# Patient Record
Sex: Female | Born: 1935 | Race: White | Hispanic: No | Smoking: Never smoker
Health system: Southern US, Community
[De-identification: ages and names within clinical notes are randomized; demographics above are authoritative.]

## PROBLEM LIST (undated history)

## (undated) DIAGNOSIS — R112 Nausea with vomiting, unspecified: Secondary | ICD-10-CM

## (undated) DIAGNOSIS — I10 Essential (primary) hypertension: Secondary | ICD-10-CM

## (undated) DIAGNOSIS — Z9889 Other specified postprocedural states: Secondary | ICD-10-CM

## (undated) DIAGNOSIS — K8050 Calculus of bile duct without cholangitis or cholecystitis without obstruction: Secondary | ICD-10-CM

## (undated) HISTORY — PX: BREAST BIOPSY: SHX20

## (undated) HISTORY — PX: CHOLECYSTECTOMY: SHX55

---

## 2005-05-16 ENCOUNTER — Ambulatory Visit: Payer: Self-pay | Admitting: Gastroenterology

## 2007-01-21 ENCOUNTER — Other Ambulatory Visit: Payer: Self-pay

## 2007-01-21 ENCOUNTER — Ambulatory Visit: Payer: Self-pay | Admitting: Ophthalmology

## 2007-01-31 ENCOUNTER — Other Ambulatory Visit: Payer: Self-pay

## 2007-01-31 ENCOUNTER — Inpatient Hospital Stay: Payer: Self-pay | Admitting: General Surgery

## 2007-11-05 ENCOUNTER — Ambulatory Visit: Payer: Self-pay | Admitting: Ophthalmology

## 2007-11-24 ENCOUNTER — Ambulatory Visit: Payer: Self-pay | Admitting: Ophthalmology

## 2008-01-07 ENCOUNTER — Ambulatory Visit: Payer: Self-pay | Admitting: Ophthalmology

## 2008-01-19 ENCOUNTER — Ambulatory Visit: Payer: Self-pay | Admitting: Ophthalmology

## 2008-09-24 ENCOUNTER — Emergency Department: Payer: Self-pay | Admitting: Emergency Medicine

## 2012-06-29 ENCOUNTER — Emergency Department: Payer: Self-pay | Admitting: Emergency Medicine

## 2013-07-13 ENCOUNTER — Emergency Department: Payer: Self-pay | Admitting: Emergency Medicine

## 2013-07-13 LAB — COMPREHENSIVE METABOLIC PANEL
ANION GAP: 4 — AB (ref 7–16)
Albumin: 3.9 g/dL (ref 3.4–5.0)
Alkaline Phosphatase: 91 U/L
BUN: 16 mg/dL (ref 7–18)
Bilirubin,Total: 0.6 mg/dL (ref 0.2–1.0)
CHLORIDE: 104 mmol/L (ref 98–107)
Calcium, Total: 8.2 mg/dL — ABNORMAL LOW (ref 8.5–10.1)
Co2: 26 mmol/L (ref 21–32)
Creatinine: 0.89 mg/dL (ref 0.60–1.30)
GFR CALC NON AF AMER: 57 — AB
Glucose: 115 mg/dL — ABNORMAL HIGH (ref 65–99)
Osmolality: 270 (ref 275–301)
Potassium: 3.7 mmol/L (ref 3.5–5.1)
SGOT(AST): 39 U/L — ABNORMAL HIGH (ref 15–37)
SGPT (ALT): 26 U/L (ref 12–78)
Sodium: 134 mmol/L — ABNORMAL LOW (ref 136–145)
Total Protein: 6.7 g/dL (ref 6.4–8.2)

## 2013-07-13 LAB — URINALYSIS, COMPLETE
BACTERIA: NONE SEEN
BILIRUBIN, UR: NEGATIVE
Blood: NEGATIVE
GLUCOSE, UR: NEGATIVE mg/dL (ref 0–75)
Ketone: NEGATIVE
LEUKOCYTE ESTERASE: NEGATIVE
Nitrite: NEGATIVE
PROTEIN: NEGATIVE
Ph: 7 (ref 4.5–8.0)
SPECIFIC GRAVITY: 1.011 (ref 1.003–1.030)
Squamous Epithelial: <1
WBC UR: 1 /HPF (ref 0–5)

## 2013-07-13 LAB — CBC
HCT: 35.5 % (ref 35.0–47.0)
HGB: 12.3 g/dL (ref 12.0–16.0)
MCH: 35.3 pg — ABNORMAL HIGH (ref 26.0–34.0)
MCHC: 34.5 g/dL (ref 32.0–36.0)
MCV: 102 fL — AB (ref 80–100)
Platelet: 154 10*3/uL (ref 150–440)
RBC: 3.48 10*6/uL — ABNORMAL LOW (ref 3.80–5.20)
RDW: 13.1 % (ref 11.5–14.5)
WBC: 9.7 10*3/uL (ref 3.6–11.0)

## 2013-07-13 LAB — LIPASE, BLOOD: Lipase: 158 U/L (ref 73–393)

## 2013-07-13 LAB — TROPONIN I

## 2016-01-18 ENCOUNTER — Other Ambulatory Visit: Payer: Self-pay | Admitting: Family Medicine

## 2016-01-18 DIAGNOSIS — R0989 Other specified symptoms and signs involving the circulatory and respiratory systems: Secondary | ICD-10-CM

## 2016-01-26 ENCOUNTER — Ambulatory Visit
Admission: RE | Admit: 2016-01-26 | Discharge: 2016-01-26 | Disposition: A | Discharge status: Home / Self Care | Payer: Medicare Other | Source: Ambulatory Visit | Attending: Family Medicine | Admitting: Family Medicine

## 2016-01-26 DIAGNOSIS — R9089 Other abnormal findings on diagnostic imaging of central nervous system: Secondary | ICD-10-CM | POA: Diagnosis present

## 2016-01-26 DIAGNOSIS — I6523 Occlusion and stenosis of bilateral carotid arteries: Secondary | ICD-10-CM | POA: Diagnosis not present

## 2016-01-26 DIAGNOSIS — R0989 Other specified symptoms and signs involving the circulatory and respiratory systems: Secondary | ICD-10-CM

## 2016-02-22 DIAGNOSIS — K8050 Calculus of bile duct without cholangitis or cholecystitis without obstruction: Secondary | ICD-10-CM

## 2016-02-22 HISTORY — DX: Calculus of bile duct without cholangitis or cholecystitis without obstruction: K80.50

## 2016-03-05 ENCOUNTER — Other Ambulatory Visit: Payer: Self-pay | Admitting: Student

## 2016-03-05 DIAGNOSIS — R748 Abnormal levels of other serum enzymes: Secondary | ICD-10-CM

## 2016-03-10 ENCOUNTER — Encounter: Payer: Self-pay | Admitting: Emergency Medicine

## 2016-03-10 ENCOUNTER — Inpatient Hospital Stay (HOSPITAL_COMMUNITY)
Admission: AD | Admit: 2016-03-10 | Discharge: 2016-03-16 | DRG: 444 | Disposition: A | Discharge status: Skilled Nursing Facility | Payer: Medicare Other | Source: Other Acute Inpatient Hospital | Attending: Internal Medicine | Admitting: Internal Medicine

## 2016-03-10 ENCOUNTER — Emergency Department: Payer: Medicare Other

## 2016-03-10 ENCOUNTER — Emergency Department
Admission: EM | Admit: 2016-03-10 | Discharge: 2016-03-10 | Payer: Medicare Other | Attending: Emergency Medicine | Admitting: Emergency Medicine

## 2016-03-10 DIAGNOSIS — Z79899 Other long term (current) drug therapy: Secondary | ICD-10-CM | POA: Insufficient documentation

## 2016-03-10 DIAGNOSIS — Y95 Nosocomial condition: Secondary | ICD-10-CM | POA: Diagnosis not present

## 2016-03-10 DIAGNOSIS — J189 Pneumonia, unspecified organism: Secondary | ICD-10-CM | POA: Diagnosis not present

## 2016-03-10 DIAGNOSIS — K8050 Calculus of bile duct without cholangitis or cholecystitis without obstruction: Secondary | ICD-10-CM

## 2016-03-10 DIAGNOSIS — I10 Essential (primary) hypertension: Secondary | ICD-10-CM | POA: Insufficient documentation

## 2016-03-10 DIAGNOSIS — M1990 Unspecified osteoarthritis, unspecified site: Secondary | ICD-10-CM | POA: Diagnosis present

## 2016-03-10 DIAGNOSIS — R1010 Upper abdominal pain, unspecified: Secondary | ICD-10-CM | POA: Diagnosis present

## 2016-03-10 DIAGNOSIS — Z7982 Long term (current) use of aspirin: Secondary | ICD-10-CM | POA: Insufficient documentation

## 2016-03-10 DIAGNOSIS — R112 Nausea with vomiting, unspecified: Secondary | ICD-10-CM

## 2016-03-10 DIAGNOSIS — R109 Unspecified abdominal pain: Secondary | ICD-10-CM | POA: Diagnosis present

## 2016-03-10 DIAGNOSIS — K8051 Calculus of bile duct without cholangitis or cholecystitis with obstruction: Secondary | ICD-10-CM | POA: Insufficient documentation

## 2016-03-10 DIAGNOSIS — E876 Hypokalemia: Secondary | ICD-10-CM | POA: Diagnosis present

## 2016-03-10 DIAGNOSIS — K838 Other specified diseases of biliary tract: Secondary | ICD-10-CM | POA: Diagnosis present

## 2016-03-10 DIAGNOSIS — R7401 Elevation of levels of liver transaminase levels: Secondary | ICD-10-CM

## 2016-03-10 DIAGNOSIS — R1084 Generalized abdominal pain: Secondary | ICD-10-CM

## 2016-03-10 DIAGNOSIS — R740 Nonspecific elevation of levels of transaminase and lactic acid dehydrogenase [LDH]: Secondary | ICD-10-CM | POA: Insufficient documentation

## 2016-03-10 DIAGNOSIS — R509 Fever, unspecified: Secondary | ICD-10-CM

## 2016-03-10 DIAGNOSIS — D72829 Elevated white blood cell count, unspecified: Secondary | ICD-10-CM | POA: Diagnosis present

## 2016-03-10 DIAGNOSIS — K8030 Calculus of bile duct with cholangitis, unspecified, without obstruction: Secondary | ICD-10-CM | POA: Diagnosis not present

## 2016-03-10 DIAGNOSIS — R5081 Fever presenting with conditions classified elsewhere: Secondary | ICD-10-CM | POA: Diagnosis not present

## 2016-03-10 HISTORY — DX: Other specified postprocedural states: R11.2

## 2016-03-10 HISTORY — DX: Calculus of bile duct without cholangitis or cholecystitis without obstruction: K80.50

## 2016-03-10 HISTORY — DX: Other specified postprocedural states: Z98.890

## 2016-03-10 HISTORY — DX: Essential (primary) hypertension: I10

## 2016-03-10 LAB — URINALYSIS COMPLETE WITH MICROSCOPIC (ARMC ONLY)
BILIRUBIN URINE: NEGATIVE
Bacteria, UA: NONE SEEN
Glucose, UA: NEGATIVE mg/dL
HGB URINE DIPSTICK: NEGATIVE
LEUKOCYTES UA: NEGATIVE
NITRITE: NEGATIVE
PH: 7 (ref 5.0–8.0)
Protein, ur: NEGATIVE mg/dL
SPECIFIC GRAVITY, URINE: 1.014 (ref 1.005–1.030)

## 2016-03-10 LAB — COMPREHENSIVE METABOLIC PANEL
ALT: 280 U/L — ABNORMAL HIGH (ref 14–54)
AST: 284 U/L — AB (ref 15–41)
Albumin: 4 g/dL (ref 3.5–5.0)
Alkaline Phosphatase: 451 U/L — ABNORMAL HIGH (ref 38–126)
Anion gap: 11 (ref 5–15)
BILIRUBIN TOTAL: 5 mg/dL — AB (ref 0.3–1.2)
BUN: 18 mg/dL (ref 6–20)
CO2: 26 mmol/L (ref 22–32)
CREATININE: 0.96 mg/dL (ref 0.44–1.00)
Calcium: 9.9 mg/dL (ref 8.9–10.3)
Chloride: 97 mmol/L — ABNORMAL LOW (ref 101–111)
GFR, EST AFRICAN AMERICAN: 57 mL/min — AB (ref >60)
GFR, EST NON AFRICAN AMERICAN: 49 mL/min — AB (ref >60)
Glucose, Bld: 104 mg/dL — ABNORMAL HIGH (ref 65–99)
POTASSIUM: 3.8 mmol/L (ref 3.5–5.1)
Sodium: 134 mmol/L — ABNORMAL LOW (ref 135–145)
TOTAL PROTEIN: 7.1 g/dL (ref 6.5–8.1)

## 2016-03-10 LAB — CBC
HCT: 31.3 % — ABNORMAL LOW (ref 35.0–47.0)
Hemoglobin: 11 g/dL — ABNORMAL LOW (ref 12.0–16.0)
MCH: 36.3 pg — ABNORMAL HIGH (ref 26.0–34.0)
MCHC: 35.1 g/dL (ref 32.0–36.0)
MCV: 103.6 fL — ABNORMAL HIGH (ref 80.0–100.0)
PLATELETS: 212 10*3/uL (ref 150–440)
RBC: 3.03 MIL/uL — AB (ref 3.80–5.20)
RDW: 14 % (ref 11.5–14.5)
WBC: 12.2 10*3/uL — AB (ref 3.6–11.0)

## 2016-03-10 LAB — TROPONIN I: Troponin I: <0.03 ng/mL (ref <0.03)

## 2016-03-10 LAB — LIPASE, BLOOD: Lipase: 25 U/L (ref 11–51)

## 2016-03-10 MED ORDER — KETOROLAC TROMETHAMINE 15 MG/ML IJ SOLN
15.0000 mg | Freq: Four times a day (QID) | INTRAMUSCULAR | Status: AC | PRN
  Administered 2016-03-11 – 2016-03-15 (×4): 15 mg via INTRAVENOUS
  Filled 2016-03-10 (×3): qty 1

## 2016-03-10 MED ORDER — SODIUM CHLORIDE 0.9 % IV SOLN
INTRAVENOUS | Status: DC
  Administered 2016-03-11 – 2016-03-12 (×2): via INTRAVENOUS

## 2016-03-10 MED ORDER — ONDANSETRON HCL 4 MG PO TABS
4.0000 mg | ORAL_TABLET | Freq: Four times a day (QID) | ORAL | Status: DC | PRN
  Administered 2016-03-14: 4 mg via ORAL
  Filled 2016-03-10: qty 1

## 2016-03-10 MED ORDER — ONDANSETRON HCL 4 MG/2ML IJ SOLN
4.0000 mg | Freq: Four times a day (QID) | INTRAMUSCULAR | Status: DC | PRN
  Administered 2016-03-13 – 2016-03-15 (×2): 4 mg via INTRAVENOUS
  Filled 2016-03-10 (×2): qty 2

## 2016-03-10 MED ORDER — IOPAMIDOL (ISOVUE-300) INJECTION 61%
60.0000 mL | Freq: Once | INTRAVENOUS | Status: AC | PRN
Start: 2016-03-10 — End: 2016-03-10
  Administered 2016-03-10: 60 mL via INTRAVENOUS

## 2016-03-10 MED ORDER — SODIUM CHLORIDE 0.9 % IV BOLUS (SEPSIS)
1000.0000 mL | Freq: Once | INTRAVENOUS | Status: AC
Start: 2016-03-10 — End: 2016-03-10
  Administered 2016-03-10: 1000 mL via INTRAVENOUS

## 2016-03-10 MED ORDER — MORPHINE SULFATE (PF) 4 MG/ML IV SOLN
4.0000 mg | Freq: Once | INTRAVENOUS | Status: AC
  Administered 2016-03-10: 4 mg via INTRAVENOUS
  Filled 2016-03-10: qty 1

## 2016-03-10 MED ORDER — MORPHINE SULFATE (PF) 4 MG/ML IV SOLN: 2.0000 mg | Freq: Once | INTRAVENOUS | Status: DC

## 2016-03-10 MED ORDER — ONDANSETRON HCL 4 MG/2ML IJ SOLN: 4.0000 mg | Freq: Once | INTRAMUSCULAR | Status: DC

## 2016-03-10 MED ORDER — ENOXAPARIN SODIUM 30 MG/0.3ML ~~LOC~~ SOLN
30.0000 mg | SUBCUTANEOUS | Status: DC
  Administered 2016-03-11 – 2016-03-15 (×5): 30 mg via SUBCUTANEOUS
  Filled 2016-03-10 (×5): qty 0.3

## 2016-03-10 MED ORDER — IOPAMIDOL (ISOVUE-300) INJECTION 61%
15.0000 mL | INTRAVENOUS | Status: AC
  Administered 2016-03-10: 15 mL via ORAL

## 2016-03-10 MED ORDER — HYDRALAZINE HCL 20 MG/ML IJ SOLN: 10.0000 mg | Freq: Four times a day (QID) | INTRAMUSCULAR | Status: DC | PRN

## 2016-03-10 NOTE — Progress Notes (Signed)
Patient arrived to [facility] via carelink. Report received from [place], [place]. No new updates from carelink. Called patient placement to receive orders for patient. Was told that Dr. Benjamine Mola would be paged. Patient stable, alert and oriented x4. Denies pain. Will continue to monitor patient.

## 2016-03-10 NOTE — H&P (Signed)
History and Physical    Susan Blackwell UXL:[id number] DOB: 1920/04/16 DOA: 03/10/2016  Referring MD/NP/PA: Randell Loop PCP: [name], MD Outpatient Specialists:  Patient coming from: home via [facility]  Chief Complaint: white stools and abdominal pain  HPI: Susan Blackwell is a 82 y.o. female with medical history significant of HTN and osteoarthritis.  Patient lives in a single family home on her own. She still drives to the grocery store and does all her ADLs independently. She's had a several week history of not feeling well. over the last few days she's had light almost white stools. Yesterday having 3 episodes.  Patient has also had nausea and vomiting as well as abdominal pain. She was recently seen by her PCP to check lab work and found that her liver enzymes were elevated. She was to have a liver ultrasound on Monday. She will unfortunately was unable to do this as she had worsening pain and vomiting. In the ER [place] she had a CT scan of her abdomen.  This showed choledocholithiasis with increasing common bile duct and intrahepatic biliary dilatation. Common bile duct was previously 8 mm and is now 17 mm. GI coverage at [facility] was contacted but they unfortunately did not do ERCPs. Patient was referred to [facility] for GI evaluation and possible ERCP.  Per note by accepting physician Dr. Clyde Lundborg, ER doctor [place] contacted GI doctor here at [facility]-- could not find documentation of this.    Review of Systems: all systems reviewed, negative unless stated above in HPI   Past Medical History:  Diagnosis Date  . Hypertension     Past Surgical History:  Procedure Laterality Date  . CHOLECYSTECTOMY       reports that she has never smoked. She does not have any smokeless tobacco history on file. She reports that she does not drink alcohol. Her drug history is not on file.--live home alone  Allergies  Allergen Reactions  . Tramadol Nausea Only     Family hx reviewed  Prior to  Admission medications   Medication Sig Start Date End Date Taking? Authorizing Provider  aspirin EC 81 MG tablet Take 81 mg by mouth daily.    Historical Provider, MD  Calcium Carbonate-Vit D-Min (CALTRATE 600+D PLUS MINERALS) 600-800 MG-UNIT TABS Take 1 tablet by mouth daily.    Historical Provider, MD  Cholecalciferol 2000 units TABS Take 1 tablet by mouth daily.    Historical Provider, MD  dimenhyDRINATE (DRAMAMINE) 50 MG tablet Take 50 mg by mouth every 8 (eight) hours as needed.    Historical Provider, MD  Homeopathic Products ([facility]IC[facility]) GEL Apply 1 application topically.    Historical Provider, MD  hydrochlorothiazide (HYDRODIURIL) 25 MG tablet Take 25 mg by mouth daily.    Historical Provider, MD  HYDROcodone-acetaminophen (NORCO/VICODIN) 5-325 MG tablet Take 1 tablet by mouth every 4 (four) hours as needed. 1 tablet at 2100, 0.5 tablet at 0000 03/01/16   Historical Provider, MD  metoprolol (LOPRESSOR) 50 MG tablet Take 50 mg by mouth 2 (two) times daily.    Historical Provider, MD  potassium chloride (K-DUR) 10 MEQ tablet Take 1 tablet by mouth daily as needed. 01/25/16   Historical Provider, MD  valsartan (DIOVAN) 320 MG tablet Take 320 mg by mouth daily.    Historical Provider, MD  vitamin B-12 (CYANOCOBALAMIN) 1000 MCG tablet Take 1,000 mcg by mouth daily.    Historical Provider, MD    Physical Exam: Vitals:   03/10/16 2143  BP: (!) 139/54  Pulse: 79  Resp: 18  Temp: 98.6 F (37 C)  TempSrc: Oral  SpO2: 94%  Weight: 45.1 kg (99 lb 8 oz)  Height: 4\' 9"  (1.448 m)      Constitutional: NAD, calm, comfortable- no jaundice Vitals:   03/10/16 2143  BP: (!) 139/54  Pulse: 79  Resp: 18  Temp: 98.6 F (37 C)  TempSrc: Oral  SpO2: 94%  Weight: 45.1 kg (99 lb 8 oz)  Height: 4\' 9"  (1.448 m)   Eyes: PERRL, lids and conjunctivae normal ENMT: Mucous membranes are moist. Posterior pharynx clear of any exudate or lesions.Normal dentition.  Neck: normal, supple, no masses, no  thyromegaly Respiratory: clear to auscultation bilaterally, no wheezing, no crackles. Normal respiratory effort. No accessory muscle use.  Cardiovascular: Regular rate and rhythm, no murmurs / rubs / gallops. No extremity edema. 2+ pedal pulses. No carotid bruits.  Abdomen: minimal tenderness, no masses palpated. No hepatosplenomegaly. Bowel sounds positive.  Musculoskeletal: no clubbing / cyanosis. No joint deformity upper and lower extremities. Good ROM, no contractures. Normal muscle tone.  Skin: no rashes, lesions, ulcers. No induration Neurologic: CN 2-12 grossly intact. Sensation intact, DTR normal. Strength 5/5 in all 4.  Psychiatric: Normal judgment and insight. Alert and oriented x 3. Normal mood.    Labs on Admission: I have personally reviewed following labs and imaging studies  CBC:  Recent Labs Lab 03/10/16 1355  WBC 12.2*  HGB 11.0*  HCT 31.3*  MCV 103.6*  PLT 212   Basic Metabolic Panel:  Recent Labs Lab 03/10/16 1355  NA 134*  K 3.8  CL 97*  CO2 26  GLUCOSE 104*  BUN 18  CREATININE 0.96  CALCIUM 9.9   GFR: Estimated Creatinine Clearance: 21.8 mL/min (by C-G formula based on SCr of 0.96 mg/dL). Liver Function Tests:  Recent Labs Lab 03/10/16 1355  AST 284*  ALT 280*  ALKPHOS 451*  BILITOT 5.0*  PROT 7.1  ALBUMIN 4.0    Recent Labs Lab 03/10/16 1355  LIPASE 25   No results for input(s): AMMONIA in the last 168 hours. Coagulation Profile: No results for input(s): INR, PROTIME in the last 168 hours. Cardiac Enzymes:  Recent Labs Lab 03/10/16 1355  TROPONINI <0.03   BNP (last 3 results) No results for input(s): PROBNP in the last 8760 hours. HbA1C: No results for input(s): HGBA1C in the last 72 hours. CBG: No results for input(s): GLUCAP in the last 168 hours. Lipid Profile: No results for input(s): CHOL, HDL, LDLCALC, TRIG, CHOLHDL, LDLDIRECT in the last 72 hours. Thyroid Function Tests: No results for input(s): TSH, T4TOTAL,  FREET4, T3FREE, THYROIDAB in the last 72 hours. Anemia Panel: No results for input(s): VITAMINB12, FOLATE, FERRITIN, TIBC, IRON, RETICCTPCT in the last 72 hours. Urine analysis:    Component Value Date/Time   COLORURINE YELLOW (A) 03/10/2016 1743   APPEARANCEUR CLEAR (A) 03/10/2016 1743   APPEARANCEUR Clear 07/13/2013 0540   LABSPEC 1.014 03/10/2016 1743   LABSPEC 1.011 07/13/2013 0540   PHURINE 7.0 03/10/2016 1743   GLUCOSEU NEGATIVE 03/10/2016 1743   GLUCOSEU Negative 07/13/2013 0540   HGBUR NEGATIVE 03/10/2016 1743   BILIRUBINUR NEGATIVE 03/10/2016 1743   BILIRUBINUR Negative 07/13/2013 0540   KETONESUR TRACE (A) 03/10/2016 1743   PROTEINUR NEGATIVE 03/10/2016 1743   NITRITE NEGATIVE 03/10/2016 1743   LEUKOCYTESUR NEGATIVE 03/10/2016 1743   LEUKOCYTESUR Negative 07/13/2013 0540   Sepsis Labs: Invalid input(s): PROCALCITONIN, LACTICIDVEN No results found for this or any previous visit (from the past 240 hour(s)).  Radiological Exams on Admission: Ct Abdomen Pelvis W Contrast  Result Date: 03/10/2016 CLINICAL DATA:  82 year old female with abdominal pain and vomiting for 1 day. EXAM: CT ABDOMEN AND PELVIS WITH CONTRAST TECHNIQUE: Multidetector CT imaging of the abdomen and pelvis was performed using the standard protocol following bolus administration of intravenous contrast. CONTRAST:  51mL ISOVUE-300 IOPAMIDOL (ISOVUE-300) INJECTION 61% COMPARISON:  07/13/2013 FINDINGS: Lower chest: No acute abnormality. Hepatobiliary: Increasing CBD and intrahepatic biliary dilatation identified, with the CBD now measuring 17 mm, previously 8 mm. Multiple slightly higher density structures within the CBD for compatible with choledocholithiasis. No focal hepatic abnormalities are noted. The patient is status post cholecystectomy. Pancreas: No significant abnormality Spleen: No significant abnormality Adrenals/Urinary Tract: Mild bilateral renal atrophy noted. The adrenal glands and bladder are  unremarkable. Stomach/Bowel: There is no evidence of bowel obstruction or definite bowel wall thickening. Sigmoid colonic diverticulosis noted without diverticulitis. Vascular/Lymphatic: Abdominal aortic atherosclerotic calcifications noted without aneurysm. No enlarged lymph nodes are noted. Reproductive: Uterus and bilateral adnexa are unremarkable. Other: No abdominal wall hernia or abnormality. No abdominopelvic ascites. Musculoskeletal: Grade 1 spondylolisthesis at L5-S1 again noted. Multilevel degenerative disc disease, facet arthropathy and spondylosis again noted. IMPRESSION: Choledocholithiasis with increasing CBD and intrahepatic biliary dilatation. CBD now measures 17 mm, previously 8 mm. No other acute abnormalities. Abdominal aortic atherosclerosis. Electronically Signed   By: Harmon Pier M.D.   On: 03/10/2016 17:49      Assessment/Plan Active Problems:   HTN (hypertension)   Common bile duct dilatation   Common bile duct dilatation -will need to ensure GI consulted by ER for possible ERCP -trend liver enzymes -NPO  HTN -PRN hydralazine    DVT prophylaxis: lovenox Code Status: full Family Communication: patient Disposition Plan: home once work up complete Consults called: GI called by [facility] ER-- did not see documentation of this Admission status: inpt   Pluma Diniz U Muzammil Bruins DO [facility] Pager [phone]  If 7PM-7AM, please contact night-coverage [email or web address] Password [email or web address]  03/10/2016, 10:40 PM

## 2016-03-10 NOTE — ED Provider Notes (Signed)
[facility] Emergency Department Provider Note  ____________________________________________   First MD Initiated Contact with Patient 03/10/16 1422     (approximate)  I have reviewed the triage vital signs and the nursing notes.   HISTORY  Chief Complaint Abdominal Pain   HPI Susan Blackwell is a 82 y.o. female with a history of hypertension as well as an appendectomy and cholecystectomy was presenting to the emergency department today with several weeks of worsening upper abdominal pain. She says that she also vomited last night and it was green to black. She says that she has nausea now and a 4-5 out of 10 pain in the upper abdomen. She is denying any chest pain. She was recently seen by her primary care doctor who checked lab work and found the patient have elevated liver enzymes. She was scheduled for a liver ultrasound this coming Monday due to the worsening pain and now vomiting she presented to the ER today.Patient says that she is also had loose stool yesterday that were almost white in color.   Past Medical History:  Diagnosis Date  . Hypertension     There are no active problems to display for this patient.   Past Surgical History:  Procedure Laterality Date  . CHOLECYSTECTOMY      Prior to Admission medications   Not on File    Allergies Patient has no allergy information on record.  No family history on file.  Social History Social History  Substance Use Topics  . Smoking status: Never Smoker  . Smokeless tobacco: Not on file  . Alcohol use No    Review of Systems Constitutional: No fever/chills Eyes: No visual changes. ENT: No sore throat. Cardiovascular: Denies chest pain. Respiratory: Denies shortness of breath. Gastrointestinal:  No constipation. Genitourinary: Negative for dysuria. Musculoskeletal: Negative for back pain. Skin: Negative for rash. Neurological: Negative for headaches, focal weakness or  numbness.  10-point ROS otherwise negative.  ____________________________________________   PHYSICAL EXAM:  VITAL SIGNS: ED Triage Vitals  Enc Vitals Group     BP 03/10/16 1344 (!) 158/65     Pulse Rate 03/10/16 1344 68     Resp 03/10/16 1344 18     Temp 03/10/16 1344 98.6 F (37 C)     Temp Source 03/10/16 1344 Oral     SpO2 03/10/16 1344 100 %     Weight 03/10/16 1345 96 lb (43.5 kg)     Height 03/10/16 1345 4\' 9"  (1.448 m)     Head Circumference --      Peak Flow --      Pain Score 03/10/16 1345 10     Pain Loc --      Pain Edu? --      Excl. in GC? --     Constitutional: Alert and oriented. Well appearing and in no acute distress. Eyes: Conjunctivae are normal. PERRL. EOMI. Head: Atraumatic. Nose: No congestion/rhinnorhea. Mouth/Throat: Mucous membranes are moist.   Neck: No stridor.   Cardiovascular: Normal rate, regular rhythm. Grossly normal heart sounds.   Respiratory: Normal respiratory effort.  No retractions. Lungs CTAB. Gastrointestinal: Soft Mild tenderness to palpation across lower abdomen with moderate tenderness to palpation in the upper abdomen with a negative Murphy sign. No distention.  Musculoskeletal: No lower extremity tenderness nor edema.  No joint effusions. Neurologic:  Normal speech and language. No gross focal neurologic deficits are appreciated.  Skin:  Skin is warm, dry and intact. No rash noted.  Mildly jaundiced Psychiatric: Mood  and affect are normal. Speech and behavior are normal.  ____________________________________________   LABS (all labs ordered are listed, but only abnormal results are displayed)  Labs Reviewed  COMPREHENSIVE METABOLIC PANEL - Abnormal; Notable for the following:       Result Value   Sodium 134 (*)    Chloride 97 (*)    Glucose, Bld 104 (*)    AST 284 (*)    ALT 280 (*)    Alkaline Phosphatase 451 (*)    Total Bilirubin 5.0 (*)    GFR calc non Af Amer 49 (*)    GFR calc Af Amer 57 (*)    All other  components within normal limits  CBC - Abnormal; Notable for the following:    WBC 12.2 (*)    RBC 3.03 (*)    Hemoglobin 11.0 (*)    HCT 31.3 (*)    MCV 103.6 (*)    MCH 36.3 (*)    All other components within normal limits  LIPASE, BLOOD  TROPONIN I  URINALYSIS COMPLETEWITH MICROSCOPIC ([facility] ONLY)   ____________________________________________  EKG  ED ECG REPORT I, Arelia Longest, the attending physician, personally viewed and interpreted this ECG.   Date: 03/10/2016  EKG Time: 1403  Rate: 69  Rhythm: normal sinus rhythm  Axis: Normal  Intervals:none  ST&T Change: No ST segment elevation or depression. T-wave inversions in aVL. QRS widened from previous EKG of 2008. ____________________________________________  RADIOLOGY  Pending CAT scan of the abdomen and pelvis ____________________________________________   PROCEDURES  Procedure(s) performed:   Procedures  Critical Care performed:   ____________________________________________   INITIAL IMPRESSION / ASSESSMENT AND PLAN / ED COURSE  Pertinent labs & imaging results that were available during my care of the patient were reviewed by me and considered in my medical decision making (see chart for details).    Clinical Course    Concern for biliary obstruction causing cholestasis. I question the mass due to the patient's age. Patient given antiemetics as well as pain control. Signed out to Dr. Scotty Court.  ____________________________________________   FINAL CLINICAL IMPRESSION(S) / ED DIAGNOSES  Upper abdominal pain. Transaminitis. Hyperbilirubinemia. Nausea and vomiting.    NEW MEDICATIONS STARTED DURING THIS VISIT:  New Prescriptions   No medications on file     Note:  This document was prepared using Dragon voice recognition software and may include unintentional dictation errors.    Myrna Blazer, MD 03/10/16 [phone]

## 2016-03-10 NOTE — ED Notes (Signed)
ED Provider at bedside. 

## 2016-03-10 NOTE — ED Provider Notes (Signed)
 -----------------------------------------   7:14 PM on 03/10/2016 -----------------------------------------  CT reveals choledocholithiasis, cbd 72mm. No pancreatic mass. D/w GI Dr. Earvin Hansen, doesn't perform ERCP. Will need to transfer pt to [facility] for GI eval. Pt stable for transfer. Carelink contact to page [facility] hospitalist.       Sharman Cheek, MD 03/12/16 [phone]

## 2016-03-10 NOTE — ED Notes (Signed)
Called [facility] to give report, talked to Delice Bison, Charity fundraiser, reported she would call when available to take report.

## 2016-03-10 NOTE — ED Notes (Signed)
Patient transported to CT 

## 2016-03-10 NOTE — Progress Notes (Signed)
This is a no charge note  Transfer from [facility] hospital per Dr. Thomos Lemons  82 year old man with past medical history of hypertension, s/p of cholecystectomy, who presents with nausea, vomiting and abdominal pain. Abnormal liver function with total bilirubin 5.0, ALP 451, AST 284 and ALT 280. CT-abdomen/pelvis that showed choledocholithiasis with dilated CBD to 17 mm. WBC 12.2, negative troponin, lipase 25, negative urinalysis, electrolytes renal function okay, temperature normal, no tachycardia, no tachypnea, O2 saturation 99% on room air, blood pressure 148/68. They have [facility] coverage, who does not ERCP, therefore pt needs to be transferred to [facility]. Pt is accepted to med-surg bed as inpt. EDP will consult to our GI.  Lorretta Harp, MD  [facility] Pager [phone]  If 7PM-7AM, please contact night-coverage [email or web address] Password [facility] 03/10/2016, 7:44 PM

## 2016-03-10 NOTE — ED Triage Notes (Signed)
Abdominal pain x several weeks per patient, nausea and vomiting during night.

## 2016-03-10 NOTE — ED Notes (Signed)
Pt. Did not think she could finish 2nd bottle, called CT and talked to Dr. Scotty Court to go ahead with CT.

## 2016-03-11 ENCOUNTER — Inpatient Hospital Stay (HOSPITAL_COMMUNITY): Payer: Medicare Other | Admitting: Anesthesiology

## 2016-03-11 ENCOUNTER — Inpatient Hospital Stay (HOSPITAL_COMMUNITY): Payer: Medicare Other

## 2016-03-11 ENCOUNTER — Encounter (HOSPITAL_COMMUNITY)
Admission: AD | Disposition: A | Discharge status: Skilled Nursing Facility | Payer: Self-pay | Source: Other Acute Inpatient Hospital | Attending: Internal Medicine

## 2016-03-11 ENCOUNTER — Encounter (HOSPITAL_COMMUNITY): Payer: Self-pay | Admitting: Gastroenterology

## 2016-03-11 DIAGNOSIS — K8050 Calculus of bile duct without cholangitis or cholecystitis without obstruction: Secondary | ICD-10-CM | POA: Diagnosis present

## 2016-03-11 HISTORY — PX: ERCP: SHX5425

## 2016-03-11 LAB — CBC
HCT: 27 % — ABNORMAL LOW (ref 36.0–46.0)
Hemoglobin: 9 g/dL — ABNORMAL LOW (ref 12.0–15.0)
MCH: 33.8 pg (ref 26.0–34.0)
MCHC: 33.3 g/dL (ref 30.0–36.0)
MCV: 101.5 fL — AB (ref 78.0–100.0)
PLATELETS: 171 10*3/uL (ref 150–400)
RBC: 2.66 MIL/uL — AB (ref 3.87–5.11)
RDW: 13.7 % (ref 11.5–15.5)
WBC: 5.3 10*3/uL (ref 4.0–10.5)

## 2016-03-11 LAB — COMPREHENSIVE METABOLIC PANEL
ALT: 179 U/L — AB (ref 14–54)
AST: 137 U/L — AB (ref 15–41)
Albumin: 2.9 g/dL — ABNORMAL LOW (ref 3.5–5.0)
Alkaline Phosphatase: 368 U/L — ABNORMAL HIGH (ref 38–126)
Anion gap: 7 (ref 5–15)
BUN: 11 mg/dL (ref 6–20)
CHLORIDE: 102 mmol/L (ref 101–111)
CO2: 27 mmol/L (ref 22–32)
CREATININE: 0.79 mg/dL (ref 0.44–1.00)
Calcium: 9.1 mg/dL (ref 8.9–10.3)
GFR calc non Af Amer: >60 mL/min (ref >60)
Glucose, Bld: 84 mg/dL (ref 65–99)
POTASSIUM: 3.2 mmol/L — AB (ref 3.5–5.1)
SODIUM: 136 mmol/L (ref 135–145)
Total Bilirubin: 3.6 mg/dL — ABNORMAL HIGH (ref 0.3–1.2)
Total Protein: 5.5 g/dL — ABNORMAL LOW (ref 6.5–8.1)

## 2016-03-11 SURGERY — ERCP, WITH INTERVENTION IF INDICATED
Anesthesia: General

## 2016-03-11 MED ORDER — ONDANSETRON HCL 4 MG/2ML IJ SOLN
INTRAMUSCULAR | Status: DC | PRN
  Administered 2016-03-11: 4 mg via INTRAVENOUS

## 2016-03-11 MED ORDER — FENTANYL CITRATE (PF) 100 MCG/2ML IJ SOLN
INTRAMUSCULAR | Status: DC | PRN
  Administered 2016-03-11 (×2): 25 ug via INTRAVENOUS

## 2016-03-11 MED ORDER — METOPROLOL TARTRATE 50 MG PO TABS
50.0000 mg | ORAL_TABLET | Freq: Two times a day (BID) | ORAL | Status: DC
  Administered 2016-03-11 – 2016-03-16 (×10): 50 mg via ORAL
  Filled 2016-03-11 (×10): qty 1

## 2016-03-11 MED ORDER — SODIUM CHLORIDE 0.9 % IV SOLN: INTRAVENOUS | Status: DC

## 2016-03-11 MED ORDER — PHENYLEPHRINE HCL 10 MG/ML IJ SOLN
INTRAVENOUS | Status: DC | PRN
  Administered 2016-03-11: 25 ug/min via INTRAVENOUS

## 2016-03-11 MED ORDER — CIPROFLOXACIN IN D5W 400 MG/200ML IV SOLN
INTRAVENOUS | Status: DC | PRN
  Administered 2016-03-11: 400 mg via INTRAVENOUS

## 2016-03-11 MED ORDER — NAPHAZOLINE-GLYCERIN 0.012-0.2 % OP SOLN
1.0000 [drp] | Freq: Four times a day (QID) | OPHTHALMIC | Status: DC | PRN
  Administered 2016-03-11: 2 [drp] via OPHTHALMIC
  Filled 2016-03-11: qty 15

## 2016-03-11 MED ORDER — DEXAMETHASONE SODIUM PHOSPHATE 10 MG/ML IJ SOLN
INTRAMUSCULAR | Status: DC | PRN
  Administered 2016-03-11: 10 mg via INTRAVENOUS

## 2016-03-11 MED ORDER — DIMENHYDRINATE 50 MG PO TABS
50.0000 mg | ORAL_TABLET | Freq: Three times a day (TID) | ORAL | Status: DC | PRN
  Filled 2016-03-11: qty 1

## 2016-03-11 MED ORDER — CIPROFLOXACIN IN D5W 400 MG/200ML IV SOLN
INTRAVENOUS | Status: AC
  Filled 2016-03-11: qty 200

## 2016-03-11 MED ORDER — IOPAMIDOL (ISOVUE-300) INJECTION 61%
INTRAVENOUS | Status: AC
  Filled 2016-03-11: qty 50

## 2016-03-11 MED ORDER — SUCCINYLCHOLINE CHLORIDE 200 MG/10ML IV SOSY
PREFILLED_SYRINGE | INTRAVENOUS | Status: DC | PRN
  Administered 2016-03-11: 80 mg via INTRAVENOUS

## 2016-03-11 MED ORDER — GLUCAGON HCL RDNA (DIAGNOSTIC) 1 MG IJ SOLR
INTRAMUSCULAR | Status: AC
  Filled 2016-03-11: qty 1

## 2016-03-11 MED ORDER — PROPOFOL 10 MG/ML IV BOLUS
INTRAVENOUS | Status: DC | PRN
  Administered 2016-03-11: 80 mg via INTRAVENOUS

## 2016-03-11 MED ORDER — PHENYLEPHRINE 40 MCG/ML (10ML) SYRINGE FOR IV PUSH (FOR BLOOD PRESSURE SUPPORT)
PREFILLED_SYRINGE | INTRAVENOUS | Status: DC | PRN
  Administered 2016-03-11: 40 ug via INTRAVENOUS
  Administered 2016-03-11: 80 ug via INTRAVENOUS

## 2016-03-11 MED ORDER — LACTATED RINGERS IV SOLN
INTRAVENOUS | Status: DC
Start: 2016-03-11 — End: 2016-03-11
  Administered 2016-03-11: 1000 mL via INTRAVENOUS

## 2016-03-11 MED ORDER — LACTATED RINGERS IV SOLN
INTRAVENOUS | Status: DC | PRN
  Administered 2016-03-11: 12:00:00 via INTRAVENOUS

## 2016-03-11 MED ORDER — SODIUM CHLORIDE 0.9 % IV SOLN
INTRAVENOUS | Status: DC | PRN
  Administered 2016-03-11: 80 mL

## 2016-03-11 MED ORDER — LIDOCAINE 2% (20 MG/ML) 5 ML SYRINGE
INTRAMUSCULAR | Status: DC | PRN
  Administered 2016-03-11: 50 mg via INTRAVENOUS

## 2016-03-11 MED ORDER — HYDROCODONE-ACETAMINOPHEN 5-325 MG PO TABS
1.0000 | ORAL_TABLET | ORAL | Status: DC | PRN
  Administered 2016-03-12 – 2016-03-16 (×10): 1 via ORAL
  Filled 2016-03-11 (×11): qty 1

## 2016-03-11 NOTE — Anesthesia Procedure Notes (Signed)
Procedure Name: Intubation Date/Time: 03/11/2016 12:24 PM Performed by: Annabelle Harman A Pre-anesthesia Checklist: Patient identified, Emergency Drugs available, Suction available and Patient being monitored Patient Re-evaluated:Patient Re-evaluated prior to inductionOxygen Delivery Method: Circle system utilized Preoxygenation: Pre-oxygenation with 100% oxygen Intubation Type: IV induction Ventilation: Mask ventilation without difficulty Laryngoscope Size: Miller and 2 Grade View: Grade II Tube type: Oral Tube size: 7.0 mm Number of attempts: 1 Airway Equipment and Method: Stylet Placement Confirmation: ETT inserted through vocal cords under direct vision,  positive ETCO2 and breath sounds checked- equal and bilateral Secured at: 20 cm Tube secured with: Tape Dental Injury: Teeth and Oropharynx as per pre-operative assessment

## 2016-03-11 NOTE — Anesthesia Postprocedure Evaluation (Signed)
Anesthesia Post Note  Patient: Susan Blackwell  Procedure(s) Performed: Procedure(s) (LRB): ENDOSCOPIC RETROGRADE CHOLANGIOPANCREATOGRAPHY (ERCP) (N/A)  Patient location during evaluation: PACU Anesthesia Type: General Level of consciousness: awake and alert Pain management: pain level controlled Vital Signs Assessment: post-procedure vital signs reviewed and stable Respiratory status: spontaneous breathing, nonlabored ventilation, respiratory function stable and patient connected to nasal cannula oxygen Cardiovascular status: blood pressure returned to baseline and stable Postop Assessment: no signs of nausea or vomiting Anesthetic complications: no    Last Vitals:  Vitals:   03/11/16 1450 03/11/16 1500  BP: (!) 172/70 (!) 170/72  Pulse: 75 76  Resp: 12 16  Temp:      Last Pain:  Vitals:   03/11/16 1123  TempSrc: Oral  PainSc:                  Gardy Montanari S

## 2016-03-11 NOTE — Op Note (Signed)
[facility] Patient Name: Susan Blackwell Procedure Date : 03/11/2016 MRN: [id number] Attending MD: Vida Rigger , MD Date of Birth: 12-Oct-1919 CSN: [id number] Age: 82 Admit Type: Inpatient Procedure:                ERCP Indications:              Bile duct stone(s)Elevated liver tests abnormal CT Providers:                Vida Rigger, MD, Dwain Sarna, RN, Oletha Blend,                            Technician Referring MD:              Medicines:                General Anesthesia Complications:            No immediate complications. Estimated Blood Loss:     Estimated blood loss: none. Procedure:                Pre-Anesthesia Assessment:                           - Prior to the procedure, a History and Physical                            was performed, and patient medications and                            allergies were reviewed. The patient's tolerance of                            previous anesthesia was also reviewed. The risks                            and benefits of the procedure and the sedation                            options and risks were discussed with the patient.                            All questions were answered, and informed consent                            was obtained. Prior Anticoagulants: The patient has                            taken aspirin, last dose was 2 days prior to                            procedure. ASA Grade Assessment: II - A patient                            with mild systemic disease. After reviewing the  risks and benefits, the patient was deemed in                            satisfactory condition to undergo the procedure.                           After obtaining informed consent, the scope was                            passed under direct vision. Throughout the                            procedure, the patient's blood pressure, pulse, and                            oxygen saturations were  monitored continuously. The                            [id number] [id number]) scope was introduced through                            the mouth, and used to inject contrast into and                            used to locate the major papilla. The ERCP was                            technically difficult and complex due to abnormal                            anatomy. Successful completion of the procedure was                            aided by performing the maneuvers documented                            (below) in this report. The patient tolerated the                            procedure well. Scope In: Scope Out: Findings:      The major papilla was on the rim of a diverticulum. The major papilla       was normal. there were no pancreatic duct injection or wire advancement       throughout the procedure and deep selective cannulation was obtainedwe       proceeded with a Biliary sphincterotomy was made with a Hydratome       sphincterotome using ERBE electrocautery. There was no       post-sphincterotomy bleeding. we proceeded until we had adequate biliary       drainage and could get the fully bowed sphincterotome easily in and out       of the duct The common bile duct contained multiple stones, the largest       of which was large in diameter. Choledocholithiasis was found in a  dilated duct. The biliary tree was swept with a 12 mm-15 mm and 15-18       balloon starting at the upper third of the main bile duct. Sludge was       swept from the duct. Many stones were removed. A few stones remained.       Sludge was swept from the duct. Lithotripsy with a basket-type device       was successful. Dilation of the common bile duct with a 10 mm balloon54       cm dilator was successful. The biliary tree was swept with both the       adjustable balloon's using the18 mm balloon starting at the upper third       of the main bile duct and using smaller balloons to pull it through the        sphincterotomy site. Sludge was swept from the duct. Many stones were       removed. A few stones remained despite our prolonged effort so we       elected to place One 10 Fr by 7 cm plastic stent with a single external       flap and a single internal flap was placed 5 cm into the common bile       duct. The stent was in good position. the wire the introducer and the       scope was removed and the patient tolerated the procedure well there was       no obvious immediate complication Impression:               - The major papilla was on the rim of a                            diverticulum.                           - The major papilla appeared normal. no pancreatic                            duct injection or wire advancement was done                            throughout the procedure                           - Choledocholithiasis was found. Partial removal                            was accomplished with biliary sphincterotomy; a                            stent was inserted.                           - A biliary sphincterotomy was performed.                           - The biliary tree was swept and sludge and  multiple stones were found.                           - Lithotripsy was successful.                           - Common bile duct was successfully dilated.                           - The biliary tree was swept multiple times                            afterward.                           - One plastic stent was placed into the common bile                            duct at the end of the procedure. Recommendation:           - Avoid aspirin and nonsteroidal anti-inflammatory                            medicines for 3 days.                           - Clear liquid diet today. hopefully advance diet                            tomorrow and hopefully home soon                           - Continue present medications.                           - Return to  GI clinic in 4 weeks.                           - Telephone GI clinic if symptomatic PRN.                           - Perform an ERCP in 6 weeks. consider a trial of                            urso as well and probably have spyglass available                            on recheck Procedure Code(s):        --- Professional ---                           [phone], Esophagogastroduodenoscopy, flexible,                            transoral; diagnostic, including collection of  specimen(s) by brushing or washing, when performed                            (separate procedure) Diagnosis Code(s):        --- Professional ---                           K80.50, Calculus of bile duct without cholangitis                            or cholecystitis without obstruction CPT copyright 2016 American Medical Association. All rights reserved. The codes documented in this report are preliminary and upon coder review may  be revised to meet current compliance requirements. Vida Rigger, MD 03/11/2016 2:31:53 PM This report has been signed electronically. Number of Addenda: 0

## 2016-03-11 NOTE — Transfer of Care (Signed)
Immediate Anesthesia Transfer of Care Note  Patient: Susan Blackwell  Procedure(s) Performed: Procedure(s): ENDOSCOPIC RETROGRADE CHOLANGIOPANCREATOGRAPHY (ERCP) (N/A)  Patient Location: PACU  Anesthesia Type:General  Level of Consciousness: awake, alert , oriented and patient cooperative  Airway & Oxygen Therapy: Patient Spontanous Breathing and Patient connected to nasal cannula oxygen  Post-op Assessment: Report given to RN and Post -op Vital signs reviewed and stable  Post vital signs: Reviewed and stable  Last Vitals:  Vitals:   03/11/16 0540 03/11/16 1123  BP: (!) 117/48 (!) 162/56  Pulse: 73 89  Resp: 18   Temp: 36.8 C 36.3 C    Last Pain:  Vitals:   03/11/16 1123  TempSrc: Oral  PainSc:          Complications: No apparent anesthesia complications

## 2016-03-11 NOTE — Progress Notes (Signed)
Patient ID: Susan Blackwell, female   DOB: [birth year], 82 y.o.   MRN: [id number]    PROGRESS NOTE    Susan Blackwell  OMV:[id number] DOB: 10/01/1919 DOA: 03/10/2016  PCP: [name], MD   Brief Narrative:  Pt initially presented to [facility] with abd pain several weeks in duration, worse with eating and associated with nausea but but no vomiting.   Assessment & Plan:   Acute abd pain, epigastric secondary to CBD stone - plan ERCP and sphincterotomy stone extraction and possible stent placement  - appreciate GI team following - keep NPO  - OK to provide analgesia as needed   Hypokalemia - supplement and repeat BMP in AM  Leukocytosis - suspect secondary to the abd pain and CBD stone - resolved  - CBC in AM for follow up   Transaminitis with hyperbilirubinemia  - due to the CBD stone - follow post ERCP to ensure resolution    DVT prophylaxis: Lovenox SQ Code Status: Full  Family Communication: Patient at bedside  Disposition Plan: To be determined pending ERCP results   Consultants:   [facility]   Procedures:   Plan for ERCP today   Antimicrobials:   None  Subjective: No events overnight.   Objective: Vitals:   03/10/16 2143 03/11/16 0540  BP: (!) 139/54 (!) 117/48  Pulse: 79 73  Resp: 18 18  Temp: 98.6 F (37 C) 98.2 F (36.8 C)  TempSrc: Oral Oral  SpO2: 94% 97%  Weight: 45.1 kg (99 lb 8 oz)   Height: 4\' 9"  (1.448 m)     Intake/Output Summary (Last 24 hours) at 03/11/16 1040 Last data filed at 03/11/16 0942  Gross per 24 hour  Intake           468.75 ml  Output              950 ml  Net          -481.25 ml   Filed Weights   03/10/16 2143  Weight: 45.1 kg (99 lb 8 oz)    Examination:  General exam: Appears calm and comfortable  Respiratory system: Clear to auscultation. Respiratory effort normal. Cardiovascular system: S1 & S2 heard, RRR. No JVD, rubs, gallops or clicks. No pedal edema. Gastrointestinal system: Abdomen is  nondistended.No organomegaly or masses felt. Normal bowel sounds heard. Central nervous system: Alert and oriented. No focal neurological deficits.  Data Reviewed: I have personally reviewed following labs and imaging studies  CBC:  Recent Labs Lab 03/10/16 1355 03/11/16 0438  WBC 12.2* 5.3  HGB 11.0* 9.0*  HCT 31.3* 27.0*  MCV 103.6* 101.5*  PLT 212 171   Basic Metabolic Panel:  Recent Labs Lab 03/10/16 1355 03/11/16 0438  NA 134* 136  K 3.8 3.2*  CL 97* 102  CO2 26 27  GLUCOSE 104* 84  BUN 18 11  CREATININE 0.96 0.79  CALCIUM 9.9 9.1   Liver Function Tests:  Recent Labs Lab 03/10/16 1355 03/11/16 0438  AST 284* 137*  ALT 280* 179*  ALKPHOS 451* 368*  BILITOT 5.0* 3.6*  PROT 7.1 5.5*  ALBUMIN 4.0 2.9*    Recent Labs Lab 03/10/16 1355  LIPASE 25   Cardiac Enzymes:  Recent Labs Lab 03/10/16 1355  TROPONINI <0.03   Urine analysis:    Component Value Date/Time   COLORURINE YELLOW (A) 03/10/2016 1743   APPEARANCEUR CLEAR (A) 03/10/2016 1743   APPEARANCEUR Clear 07/13/2013 0540   LABSPEC 1.014 03/10/2016 1743   LABSPEC 1.011 07/13/2013  0540   PHURINE 7.0 03/10/2016 1743   GLUCOSEU NEGATIVE 03/10/2016 1743   GLUCOSEU Negative 07/13/2013 0540   HGBUR NEGATIVE 03/10/2016 1743   BILIRUBINUR NEGATIVE 03/10/2016 1743   BILIRUBINUR Negative 07/13/2013 0540   KETONESUR TRACE (A) 03/10/2016 1743   PROTEINUR NEGATIVE 03/10/2016 1743   NITRITE NEGATIVE 03/10/2016 1743   LEUKOCYTESUR NEGATIVE 03/10/2016 1743   LEUKOCYTESUR Negative 07/13/2013 0540   Radiology Studies: Ct Abdomen Pelvis W Contrast  Result Date: 03/10/2016 CLINICAL DATA:  82 year old female with abdominal pain and vomiting for 1 day. EXAM: CT ABDOMEN AND PELVIS WITH CONTRAST TECHNIQUE: Multidetector CT imaging of the abdomen and pelvis was performed using the standard protocol following bolus administration of intravenous contrast. CONTRAST:  8mL ISOVUE-300 IOPAMIDOL (ISOVUE-300)  INJECTION 61% COMPARISON:  07/13/2013 FINDINGS: Lower chest: No acute abnormality. Hepatobiliary: Increasing CBD and intrahepatic biliary dilatation identified, with the CBD now measuring 17 mm, previously 8 mm. Multiple slightly higher density structures within the CBD for compatible with choledocholithiasis. No focal hepatic abnormalities are noted. The patient is status post cholecystectomy. Pancreas: No significant abnormality Spleen: No significant abnormality Adrenals/Urinary Tract: Mild bilateral renal atrophy noted. The adrenal glands and bladder are unremarkable. Stomach/Bowel: There is no evidence of bowel obstruction or definite bowel wall thickening. Sigmoid colonic diverticulosis noted without diverticulitis. Vascular/Lymphatic: Abdominal aortic atherosclerotic calcifications noted without aneurysm. No enlarged lymph nodes are noted. Reproductive: Uterus and bilateral adnexa are unremarkable. Other: No abdominal wall hernia or abnormality. No abdominopelvic ascites. Musculoskeletal: Grade 1 spondylolisthesis at L5-S1 again noted. Multilevel degenerative disc disease, facet arthropathy and spondylosis again noted. IMPRESSION: Choledocholithiasis with increasing CBD and intrahepatic biliary dilatation. CBD now measures 17 mm, previously 8 mm. No other acute abnormalities. Abdominal aortic atherosclerosis. Electronically Signed   By: Harmon Pier M.D.   On: 03/10/2016 17:49      Scheduled Meds: . enoxaparin (LOVENOX) injection  30 mg Subcutaneous Q24H   Continuous Infusions: . sodium chloride 75 mL/hr at 03/11/16 0005     LOS: 1 day   Time spent: 20 minutes   Debbora Presto, MD [facility] Pager [phone]  If 7PM-7AM, please contact night-coverage [email or web address] Password [email or web address] 03/11/2016, 10:40 AM

## 2016-03-11 NOTE — Anesthesia Preprocedure Evaluation (Signed)
Anesthesia Evaluation  Patient identified by MRN, date of birth, ID band Patient awake    Reviewed: Allergy & Precautions, NPO status , Patient's Chart, lab work & pertinent test results  Airway Mallampati: II  TM Distance: >3 FB Neck ROM: Full    Dental no notable dental hx.    Pulmonary neg pulmonary ROS,    Pulmonary exam normal breath sounds clear to auscultation       Cardiovascular hypertension, negative cardio ROS Normal cardiovascular exam Rhythm:Regular Rate:Normal     Neuro/Psych negative neurological ROS  negative psych ROS   GI/Hepatic negative GI ROS, Neg liver ROS,   Endo/Other  negative endocrine ROS  Renal/GU negative Renal ROS  negative genitourinary   Musculoskeletal negative musculoskeletal ROS (+)   Abdominal   Peds negative pediatric ROS (+)  Hematology  (+) anemia ,   Anesthesia Other Findings   Reproductive/Obstetrics negative OB ROS                             Anesthesia Physical Anesthesia Plan  ASA: III and emergent  Anesthesia Plan: General   Post-op Pain Management:    Induction: Intravenous  Airway Management Planned: Oral ETT  Additional Equipment:   Intra-op Plan:   Post-operative Plan: Extubation in OR  Informed Consent: I have reviewed the patients History and Physical, chart, labs and discussed the procedure including the risks, benefits and alternatives for the proposed anesthesia with the patient or authorized representative who has indicated his/her understanding and acceptance.   Dental advisory given  Plan Discussed with: CRNA and Surgeon  Anesthesia Plan Comments:         Anesthesia Quick Evaluation

## 2016-03-11 NOTE — Consult Note (Signed)
Reason for Consult: Abnormal CAT scan CBD stone Referring Physician: Hospital team  Susan Blackwell is an 82 y.o. female.  HPI: Patient seen and examined and case discussed with the [facility] on call GI and her hospital computer chart was reviewed and her labs and CT reviewed and she has been having stomach problems off and on for a while but in the last 2 weeks has noticed periodic dark urine and light stools and increased pain after eating and she has had some periodic itching and she does live alone and continues to drive and had her gallbladder out in the past and she has no other complaints  Past Medical History:  Diagnosis Date  . Hypertension     Past Surgical History:  Procedure Laterality Date  . CHOLECYSTECTOMY      History reviewed. No pertinent family history.  Social History:  reports that she has never smoked. She does not have any smokeless tobacco history on file. She reports that she does not drink alcohol. Her drug history is not on file.  Allergies:  Allergies  Allergen Reactions  . Tramadol Nausea Only    Medications: I have reviewed the patient's current medications.  Results for orders placed or performed during the hospital encounter of 03/10/16 (from the past 48 hour(s))  CBC     Status: Abnormal   Collection Time: 03/11/16  4:38 AM  Result Value Ref Range   WBC 5.3 4.0 - 10.5 K/uL   RBC 2.66 (L) 3.87 - 5.11 MIL/uL   Hemoglobin 9.0 (L) 12.0 - 15.0 g/dL   HCT 27.0 (L) 36.0 - 46.0 %   MCV 101.5 (H) 78.0 - 100.0 fL   MCH 33.8 26.0 - 34.0 pg   MCHC 33.3 30.0 - 36.0 g/dL   RDW 13.7 11.5 - 15.5 %   Platelets 171 150 - 400 K/uL  Comprehensive metabolic panel     Status: Abnormal   Collection Time: 03/11/16  4:38 AM  Result Value Ref Range   Sodium 136 135 - 145 mmol/L   Potassium 3.2 (L) 3.5 - 5.1 mmol/L   Chloride 102 101 - 111 mmol/L   CO2 27 22 - 32 mmol/L   Glucose, Bld 84 65 - 99 mg/dL   BUN 11 6 - 20 mg/dL   Creatinine, Ser 0.79 0.44 - 1.00 mg/dL    Calcium 9.1 8.9 - 10.3 mg/dL   Total Protein 5.5 (L) 6.5 - 8.1 g/dL   Albumin 2.9 (L) 3.5 - 5.0 g/dL   AST 137 (H) 15 - 41 U/L   ALT 179 (H) 14 - 54 U/L   Alkaline Phosphatase 368 (H) 38 - 126 U/L   Total Bilirubin 3.6 (H) 0.3 - 1.2 mg/dL   GFR calc non Af Amer >60 >60 mL/min   GFR calc Af Amer >60 >60 mL/min    Comment: (NOTE) The eGFR has been calculated using the CKD EPI equation. This calculation has not been validated in all clinical situations. eGFR's persistently <60 mL/min signify possible Chronic Kidney Disease.    Anion gap 7 5 - 15    Ct Abdomen Pelvis W Contrast  Result Date: 03/10/2016 CLINICAL DATA:  82 year old female with abdominal pain and vomiting for 1 day. EXAM: CT ABDOMEN AND PELVIS WITH CONTRAST TECHNIQUE: Multidetector CT imaging of the abdomen and pelvis was performed using the standard protocol following bolus administration of intravenous contrast. CONTRAST:  33m ISOVUE-300 IOPAMIDOL (ISOVUE-300) INJECTION 61% COMPARISON:  07/13/2013 FINDINGS: Lower chest: No acute abnormality. Hepatobiliary: Increasing  CBD and intrahepatic biliary dilatation identified, with the CBD now measuring 17 mm, previously 8 mm. Multiple slightly higher density structures within the CBD for compatible with choledocholithiasis. No focal hepatic abnormalities are noted. The patient is status post cholecystectomy. Pancreas: No significant abnormality Spleen: No significant abnormality Adrenals/Urinary Tract: Mild bilateral renal atrophy noted. The adrenal glands and bladder are unremarkable. Stomach/Bowel: There is no evidence of bowel obstruction or definite bowel wall thickening. Sigmoid colonic diverticulosis noted without diverticulitis. Vascular/Lymphatic: Abdominal aortic atherosclerotic calcifications noted without aneurysm. No enlarged lymph nodes are noted. Reproductive: Uterus and bilateral adnexa are unremarkable. Other: No abdominal wall hernia or abnormality. No abdominopelvic  ascites. Musculoskeletal: Grade 1 spondylolisthesis at L5-S1 again noted. Multilevel degenerative disc disease, facet arthropathy and spondylosis again noted. IMPRESSION: Choledocholithiasis with increasing CBD and intrahepatic biliary dilatation. CBD now measures 17 mm, previously 8 mm. No other acute abnormalities. Abdominal aortic atherosclerosis. Electronically Signed   By: Margarette Canada M.D.   On: 03/10/2016 17:49    ROSNegative except above she is on a baby aspirin a day but no other blood thinners  Blood pressure (!) 117/48, pulse 73, temperature 98.2 F (36.8 C), temperature source Oral, resp. rate 18, height 4' 9"  (1.448 m), weight 45.1 kg (99 lb 8 oz), SpO2 97 %. Physical ExamVital signs stable afebrile no acute distress she does have a murmur otherwise exam please see preassessment evaluation labs and CT reviewed   Assessment/Plan: CBD stone Plan: The risks benefits methods and success rate of ERCP and sphincterotomy stone extraction and possible stent placement was discussed with the patient and will proceed later today with further workup plans and recommendations pending those findings and they have tentatively scheduled her for noon today   [facility] E 03/11/2016, 9:43 AM

## 2016-03-12 ENCOUNTER — Encounter
Admission: RE | Admit: 2016-03-12 | Discharge: 2016-03-12 | Disposition: A | Discharge status: Home / Self Care | Payer: Medicare Other | Source: Ambulatory Visit | Attending: Internal Medicine | Admitting: Internal Medicine

## 2016-03-12 ENCOUNTER — Encounter (HOSPITAL_COMMUNITY): Payer: Self-pay | Admitting: Gastroenterology

## 2016-03-12 ENCOUNTER — Ambulatory Visit: Admission: RE | Admit: 2016-03-12 | Payer: Medicare Other | Source: Ambulatory Visit

## 2016-03-12 DIAGNOSIS — D649 Anemia, unspecified: Secondary | ICD-10-CM | POA: Insufficient documentation

## 2016-03-12 DIAGNOSIS — K8050 Calculus of bile duct without cholangitis or cholecystitis without obstruction: Principal | ICD-10-CM

## 2016-03-12 LAB — COMPREHENSIVE METABOLIC PANEL
ALT: 122 U/L — ABNORMAL HIGH (ref 14–54)
ANION GAP: 11 (ref 5–15)
AST: 71 U/L — AB (ref 15–41)
Albumin: 2.9 g/dL — ABNORMAL LOW (ref 3.5–5.0)
Alkaline Phosphatase: 280 U/L — ABNORMAL HIGH (ref 38–126)
BILIRUBIN TOTAL: 1.5 mg/dL — AB (ref 0.3–1.2)
BUN: 13 mg/dL (ref 6–20)
CHLORIDE: 104 mmol/L (ref 101–111)
CO2: 21 mmol/L — ABNORMAL LOW (ref 22–32)
Calcium: 8.4 mg/dL — ABNORMAL LOW (ref 8.9–10.3)
Creatinine, Ser: 1 mg/dL (ref 0.44–1.00)
GFR, EST AFRICAN AMERICAN: 54 mL/min — AB (ref >60)
GFR, EST NON AFRICAN AMERICAN: 47 mL/min — AB (ref >60)
Glucose, Bld: 129 mg/dL — ABNORMAL HIGH (ref 65–99)
POTASSIUM: 3.7 mmol/L (ref 3.5–5.1)
Sodium: 136 mmol/L (ref 135–145)
TOTAL PROTEIN: 5.2 g/dL — AB (ref 6.5–8.1)

## 2016-03-12 LAB — CBC WITH DIFFERENTIAL/PLATELET
BASOS ABS: 0 10*3/uL (ref 0.0–0.1)
Basophils Relative: 0 %
Eosinophils Absolute: 0 10*3/uL (ref 0.0–0.7)
Eosinophils Relative: 0 %
HCT: 24.3 % — ABNORMAL LOW (ref 36.0–46.0)
HEMOGLOBIN: 8.5 g/dL — AB (ref 12.0–15.0)
LYMPHS ABS: 0.5 10*3/uL — AB (ref 0.7–4.0)
LYMPHS PCT: 7 %
MCH: 35.4 pg — AB (ref 26.0–34.0)
MCHC: 35 g/dL (ref 30.0–36.0)
MCV: 101.3 fL — AB (ref 78.0–100.0)
Monocytes Absolute: 0.6 10*3/uL (ref 0.1–1.0)
Monocytes Relative: 8 %
NEUTROS ABS: 6.3 10*3/uL (ref 1.7–7.7)
NEUTROS PCT: 85 %
PLATELETS: 145 10*3/uL — AB (ref 150–400)
RBC: 2.4 MIL/uL — AB (ref 3.87–5.11)
RDW: 13.4 % (ref 11.5–15.5)
WBC: 7.3 10*3/uL (ref 4.0–10.5)

## 2016-03-12 MED ORDER — WHITE PETROLATUM GEL
Status: AC
  Administered 2016-03-12: 16:00:00
  Filled 2016-03-12: qty 1

## 2016-03-12 MED ORDER — ALUM & MAG HYDROXIDE-SIMETH 200-200-20 MG/5ML PO SUSP
15.0000 mL | Freq: Four times a day (QID) | ORAL | Status: DC | PRN
  Administered 2016-03-12: 15 mL via ORAL
  Filled 2016-03-12: qty 30

## 2016-03-12 MED ORDER — PHENOL 1.4 % MT LIQD
1.0000 | OROMUCOSAL | Status: DC | PRN
  Administered 2016-03-12: 1 via OROMUCOSAL
  Filled 2016-03-12: qty 177

## 2016-03-12 NOTE — Progress Notes (Signed)
[facility] Progress Note  Subjective: The patient is doing well today after her ERCP with sphincterotomy and biliary stone extractions yesterday. Not all of the stones from the CBD were able to be removed and she has a biliary stent in place. She has no complaints of abdominal pain. She tolerated a liquid diet this morning.  Objective: Vital signs in last 24 hours: Temp:  [97.4 F (36.3 C)-98.3 F (36.8 C)] 97.9 F (36.6 C) (11/20 0537) Pulse Rate:  [59-89] 59 (11/20 0537) Resp:  [12-17] 16 (11/20 0537) BP: (130-172)/(56-72) 130/57 (11/20 0537) SpO2:  [98 %-100 %] 100 % (11/20 0537) Weight change:    PE:  No distress  Heart regular rhythm  Lungs clear  Abdomen soft and nontender  Lab Results: Results for orders placed or performed during the hospital encounter of 03/10/16 (from the past 24 hour(s))  Comprehensive metabolic panel     Status: Abnormal   Collection Time: 03/12/16  4:36 AM  Result Value Ref Range   Sodium 136 135 - 145 mmol/L   Potassium 3.7 3.5 - 5.1 mmol/L   Chloride 104 101 - 111 mmol/L   CO2 21 (L) 22 - 32 mmol/L   Glucose, Bld 129 (H) 65 - 99 mg/dL   BUN 13 6 - 20 mg/dL   Creatinine, Ser 4.09 0.44 - 1.00 mg/dL   Calcium 8.4 (L) 8.9 - 10.3 mg/dL   Total Protein 5.2 (L) 6.5 - 8.1 g/dL   Albumin 2.9 (L) 3.5 - 5.0 g/dL   AST 71 (H) 15 - 41 U/L   ALT 122 (H) 14 - 54 U/L   Alkaline Phosphatase 280 (H) 38 - 126 U/L   Total Bilirubin 1.5 (H) 0.3 - 1.2 mg/dL   GFR calc non Af Amer 47 (L) >60 mL/min   GFR calc Af Amer 54 (L) >60 mL/min   Anion gap 11 5 - 15  CBC with Differential/Platelet     Status: Abnormal   Collection Time: 03/12/16  4:36 AM  Result Value Ref Range   WBC 7.3 4.0 - 10.5 K/uL   RBC 2.40 (L) 3.87 - 5.11 MIL/uL   Hemoglobin 8.5 (L) 12.0 - 15.0 g/dL   HCT 81.1 (L) 91.4 - 78.2 %   MCV 101.3 (H) 78.0 - 100.0 fL   MCH 35.4 (H) 26.0 - 34.0 pg   MCHC 35.0 30.0 - 36.0 g/dL   RDW 95.6 21.3 - 08.6 %   Platelets 145 (L) 150 - 400  K/uL   Neutrophils Relative % 85 %   Neutro Abs 6.3 1.7 - 7.7 K/uL   Lymphocytes Relative 7 %   Lymphs Abs 0.5 (L) 0.7 - 4.0 K/uL   Monocytes Relative 8 %   Monocytes Absolute 0.6 0.1 - 1.0 K/uL   Eosinophils Relative 0 %   Eosinophils Absolute 0.0 0.0 - 0.7 K/uL   Basophils Relative 0 %   Basophils Absolute 0.0 0.0 - 0.1 K/uL    Studies/Results: Dg Ercp Biliary & Pancreatic Ducts  Result Date: 03/11/2016 CLINICAL DATA:  82 year old female with a history of biliary ductal stones. Stent placement EXAM: ERCP TECHNIQUE: Multiple spot images obtained with the fluoroscopic device and submitted for interpretation post-procedure. FLUOROSCOPY TIME:  20 minutes 43 seconds COMPARISON:  CT 03/10/2016 FINDINGS: Limited fluoroscopic images during ERCP. Initial image demonstrates endoscope in the upper abdomen with cannulation of the ampulla and retrograde infusion of contrast partially opacifying the extrahepatic biliary system. Subsequent images demonstrate placement of plastic biliary stent. Surgical clips  compatible with prior cholecystectomy. IMPRESSION: Limited images during ERCP and plastic biliary stent placement. Please refer to the dictated operative report for full details of intraoperative findings and procedure. Signed, Yvone Neu. Loreta Ave, DO [facility]iology Electronically Signed   By: Gilmer Mor D.O.   On: 03/11/2016 14:41      Assessment: Choledocholithiasis. Doing well status post ERCP yesterday. Her LFTs although still elevated are improved. No elevation of WBCs postprocedure.  Plan:   She is doing well. As per Dr Ewing Schlein he would like to see her back in the office in a few weeks for follow-up. I think her diet could be advanced at this time, and go home soon. We will sign off.    Susan Blackwell 03/12/2016, 9:27 AM  Pager: [phone] If no answer or after 5 PM call [phone]

## 2016-03-12 NOTE — Progress Notes (Signed)
Discharge home cancelled by MD as pt's family voiced concerns about a hospital to home discharge. Family asking for pt to go to rehab upon discharge from hospital. PT eval  ordered

## 2016-03-12 NOTE — Discharge Summary (Addendum)
Physician Discharge Summary  Susan Blackwell KGM:[id number] DOB: 1919-10-12 DOA: 03/10/2016  PCP: [name], MD  Admit date: 03/10/2016 Discharge date: 03/13/2016  Recommendations for Outpatient Follow-up:  1. Pt will need to follow up with PCP in 2-3 weeks post discharge 2. Please obtain BMP to evaluate electrolytes and kidney function 3. Must hold aspirin for three days, no NSAID's  Discharge Diagnoses:  Active Problems:   HTN (hypertension)   Common bile duct dilatation   Choledocholithiasis  Discharge Condition: Stable  Diet recommendation: Heart healthy diet discussed in details   Brief Narrative:  Pt initially presented to [facility] with abd pain several weeks in duration, worse with eating and associated with nausea but but no vomiting.   Assessment & Plan:   Acute abd pain, epigastric secondary to CBD stone - pt is s/p ERCP with sphincterotomy and biliary stone extractions yesterday. - please note that not all of the stones from the CBD were able to be removed and pt now has a biliary stent in place - no complaints of abdominal pain - diet advanced to soft and pt tolerating well   Hypokalemia - supplemented prior to discharge   Leukocytosis - suspect secondary to the abd pain and CBD stone - resolved   Transaminitis with hyperbilirubinemia  - due to the CBD stone - LFT's trending down  Drop in Hg - no signs of active bleed, suspect some dilutional component from IVF  DVT prophylaxis: Lovenox SQ Code Status: Full  Family Communication: Patient at bedside  Disposition Plan: SNF, GI team cleared for discharge with close follow up   Consultants:   [facility]   Procedures:   ERCP  Antimicrobials:   None  Procedures/Studies: Ct Abdomen Pelvis W Contrast Result Date: 03/10/2016 Choledocholithiasis with increasing CBD and intrahepatic biliary dilatation. CBD now measures 17 mm, previously 8 mm.   Dg Ercp Biliary & Pancreatic  Ducts Result Date: 03/11/2016 Limited images during ERCP and plastic biliary stent placement.   Discharge Exam: Vitals:   03/11/16 2100 03/12/16 0537  BP: 137/69 (!) 130/57  Pulse: 81 (!) 59  Resp: 17 16  Temp: 98.3 F (36.8 C) 97.9 F (36.6 C)   Vitals:   03/11/16 1450 03/11/16 1500 03/11/16 2100 03/12/16 0537  BP: (!) 172/70 (!) 170/72 137/69 (!) 130/57  Pulse: 75 76 81 (!) 59  Resp: 12 16 17 16   Temp:  97.9 F (36.6 C) 98.3 F (36.8 C) 97.9 F (36.6 C)  TempSrc:   Oral   SpO2: 100% 100% 100% 100%  Weight:      Height:        General: Pt is alert, follows commands appropriately, not in acute distress Cardiovascular: Regular rate and rhythm, no rubs, no gallops Respiratory: Clear to auscultation bilaterally, no wheezing, no crackles, no rhonch iAbdominal: Soft, non tender, non distended, bowel sounds +, no guarding  Discharge Instructions     Medication List    STOP taking these medications   aspirin EC 81 MG tablet   HYDROcodone-acetaminophen 5-325 MG tablet Commonly known as:  NORCO/VICODIN     TAKE these medications   ARNICARE Gel Apply 1 application topically.   CALTRATE 600+D PLUS MINERALS 600-800 MG-UNIT Tabs Take 1 tablet by mouth daily.   Cholecalciferol 2000 units Tabs Take 1 tablet by mouth daily.   dimenhyDRINATE 50 MG tablet Commonly known as:  DRAMAMINE Take 50 mg by mouth every 8 (eight) hours as needed.   hydrochlorothiazide 25 MG tablet Commonly known as:  HYDRODIURIL Take 25 mg by mouth daily.   metoprolol 50 MG tablet Commonly known as:  LOPRESSOR Take 50 mg by mouth 2 (two) times daily.   potassium chloride 10 MEQ tablet Commonly known as:  K-DUR Take 1 tablet by mouth daily.   THERA-GESIC PLUS Crea Apply 1 application topically at bedtime.   valsartan 320 MG tablet Commonly known as:  DIOVAN Take 320 mg by mouth daily.   vitamin B-12 1000 MCG tablet Commonly known as:  CYANOCOBALAMIN Take 1,000 mcg by mouth  daily.       Follow-up Information    Susan BRONSTEIN, MD Follow up.   Specialty:  Family Medicine Contact information: [address] [place] [address] [phone]            The results of significant diagnostics from this hospitalization (including imaging, microbiology, ancillary and laboratory) are listed below for reference.     Microbiology: No results found for this or any previous visit (from the past 240 hour(s)).   Labs: Basic Metabolic Panel:  Recent Labs Lab 03/10/16 1355 03/11/16 0438 03/12/16 0436  NA 134* 136 136  K 3.8 3.2* 3.7  CL 97* 102 104  CO2 26 27 21*  GLUCOSE 104* 84 129*  BUN 18 11 13   CREATININE 0.96 0.79 1.00  CALCIUM 9.9 9.1 8.4*   Liver Function Tests:  Recent Labs Lab 03/10/16 1355 03/11/16 0438 03/12/16 0436  AST 284* 137* 71*  ALT 280* 179* 122*  ALKPHOS 451* 368* 280*  BILITOT 5.0* 3.6* 1.5*  PROT 7.1 5.5* 5.2*  ALBUMIN 4.0 2.9* 2.9*    Recent Labs Lab 03/10/16 1355  LIPASE 25   No results for input(s): AMMONIA in the last 168 hours. CBC:  Recent Labs Lab 03/10/16 1355 03/11/16 0438 03/12/16 0436  WBC 12.2* 5.3 7.3  NEUTROABS  --   --  6.3  HGB 11.0* 9.0* 8.5*  HCT 31.3* 27.0* 24.3*  MCV 103.6* 101.5* 101.3*  PLT 212 171 145*   Cardiac Enzymes:  Recent Labs Lab 03/10/16 1355  TROPONINI <0.03   BNP: BNP (last 3 results) No results for input(s): BNP in the last 8760 hours.  ProBNP (last 3 results) No results for input(s): PROBNP in the last 8760 hours.  CBG: No results for input(s): GLUCAP in the last 168 hours.   SIGNED: Time coordinating discharge: Over 30 minutes  03/12/16, MD  [facility] 03/12/2016, 9:02 AM Pager [phone]  If 7PM-7AM, please contact night-coverage [email or web address] Password [email or web address]

## 2016-03-12 NOTE — Clinical Social Work Note (Signed)
Clinical Social Work Assessment  Patient Details  Name: Susan Blackwell MRN: [id number] Date of Birth: Apr 22, 1920  Date of referral:  03/12/16               Reason for consult:  Discharge Planning                Permission sought to share information with:  Case Manager, Facility Medical sales representative, Family Supports Permission granted to share information::  Yes, Verbal Permission Granted  Name::      Friend: Burnetta Sabin [phone]  Agency::     Relationship::  Also has a son, unable to reach (message left for [employer] Information:     Housing/Transportation Living arrangements for the past 2 months:  Single Family Home Source of Information:  Patient, Medical Team, Case Manager Patient Interpreter Needed:  None Criminal Activity/Legal Involvement Pertinent to Current Situation/Hospitalization:  No - Comment as needed Significant Relationships:  Adult Children, Other Family Members, Friend Lives with:  Self Do you feel safe going back to the place where you live?  Yes Need for family participation in patient care:  Yes (Comment)  Care giving concerns:  Patient reports she lives at home alone with friend and son who are available if needed and that patient wants to go home.  Discussed option of SNF with patient who reports she would like to go home. Reports she will do well and if needed she has her friend and sister in law.  AD of units took a call from a friend of patient who reports they would like to talk about disposition.  Call placed to friend and message left. Patient will require a PT consult in effort to understand baseline.   Social Worker assessment / plan:  Plan currently is home. LCSW explained role and reason for consult along with services while in hospital. Patient alert and oriented, very friendly and agreeable to plan for going home. Will continue to reach family and friend discuss other options or needs.  Employment status:  Retired Automotive engineer PT Recommendations:  Not assessed at this time (have asked for referral) Information / Referral to community resources:  Skilled Nursing Facility  Patient/Family's Response to care:  Agreeable to plan home  Patient/Family's Understanding of and Emotional Response to Diagnosis, Current Treatment, and Prognosis:  Unclear of patient's baseline in reference to going home vs SNF. Patient reports she has no falls, doing well after this admission and wants to return home.  Emotional Assessment Appearance:  Appears stated age Attitude/Demeanor/Rapport:  Other (very pleasant/cooperative) Affect (typically observed):  Accepting, Adaptable, Pleasant Orientation:  Oriented to Self, Oriented to Place, Oriented to  Time, Oriented to Situation Alcohol / Substance use:  Not Applicable Psych involvement (Current and /or in the community):  No (Comment)  Discharge Needs  Concerns to be addressed:  No discharge needs identified Readmission within the last 30 days:  No Current discharge risk:  None Barriers to Discharge:  No Barriers Identified, Continued Medical Work up   Raye Sorrow, LCSW 03/12/2016, 3:11 PM

## 2016-03-12 NOTE — Discharge Instructions (Signed)

## 2016-03-13 DIAGNOSIS — K8051 Calculus of bile duct without cholangitis or cholecystitis with obstruction: Secondary | ICD-10-CM

## 2016-03-13 LAB — COMPREHENSIVE METABOLIC PANEL
ALK PHOS: 240 U/L — AB (ref 38–126)
ALT: 93 U/L — AB (ref 14–54)
ANION GAP: 6 (ref 5–15)
AST: 47 U/L — ABNORMAL HIGH (ref 15–41)
Albumin: 2.8 g/dL — ABNORMAL LOW (ref 3.5–5.0)
BILIRUBIN TOTAL: 1.2 mg/dL (ref 0.3–1.2)
BUN: 9 mg/dL (ref 6–20)
CALCIUM: 8 mg/dL — AB (ref 8.9–10.3)
CO2: 26 mmol/L (ref 22–32)
CREATININE: 0.81 mg/dL (ref 0.44–1.00)
Chloride: 107 mmol/L (ref 101–111)
Glucose, Bld: 109 mg/dL — ABNORMAL HIGH (ref 65–99)
Potassium: 3.3 mmol/L — ABNORMAL LOW (ref 3.5–5.1)
Sodium: 139 mmol/L (ref 135–145)
TOTAL PROTEIN: 4.9 g/dL — AB (ref 6.5–8.1)

## 2016-03-13 MED ORDER — LORAZEPAM 0.5 MG PO TABS
0.5000 mg | ORAL_TABLET | Freq: Two times a day (BID) | ORAL | Status: DC | PRN
  Administered 2016-03-13 – 2016-03-15 (×3): 0.5 mg via ORAL
  Filled 2016-03-13 (×3): qty 1

## 2016-03-13 MED ORDER — POTASSIUM CHLORIDE CRYS ER 20 MEQ PO TBCR
40.0000 meq | EXTENDED_RELEASE_TABLET | Freq: Once | ORAL | Status: AC
  Administered 2016-03-13: 40 meq via ORAL
  Filled 2016-03-13: qty 2

## 2016-03-13 NOTE — Progress Notes (Signed)
Pt reported feeling anxious and was having breathing difficulty on room air. Vital signs checked , noted to be within therapeutic limits and 2l oxygen via nasal canula re implemented. MD notified and order to cancel discharge till tomorrow. Prn ativan ordered but pt did not want medication when offered

## 2016-03-13 NOTE — Progress Notes (Signed)
Awaiting for PT eval to determined appropriate discharge, d/c summary completed 11/20, no changes needed at this time.   Debbora Presto, MD  [facility] Pager [phone]  If 7PM-7AM, please contact night-coverage [email or web address] Password [email or web address]

## 2016-03-13 NOTE — Progress Notes (Addendum)
PT recommending SNF. LCSW has met with patient. Consents to SNF: would like [facility] Call placed to SNF.  They are reviewing information and will follow up. Aware of discharge summary currently and discharge.  Will follow up.  LCSW following for disposition. Awaiting PT consult and baseline needs. Left message for family friend regarding barriers at this time.  Will follow and continue to assist with disposition.  Lane Hacker, MSW Clinical Social Work: Printmaker Coverage for :  [phone]

## 2016-03-13 NOTE — Progress Notes (Addendum)
2:52 PM   Held discharge due to breathing and chest pain.  Patient has been accepted to [facility] for SNF. She will have a private room:   [address]  [address] Report Number:  [phone]  LCSW has called family regarding plan and update. Another message left regarding plan. Patient in agreement with SNF. LCSW has sent all paperwork to SNF via [facility].  No other needs at this time.  DC to SNF.  Susan Blackwell, MSW Clinical Social Work: Optician, dispensing Coverage for :  [phone]

## 2016-03-13 NOTE — Progress Notes (Signed)
Patient ID: Susan Blackwell, female   DOB: 07-30-1919, 82 y.o.   MRN: [id number]    PROGRESS NOTE    Susan Blackwell  ION:[id number] DOB: 05-26-1919 DOA: 03/10/2016  PCP: [name], MD   Brief Narrative:  Pt initially presented to [facility] with abd pain several weeks in duration, worse with eating and associated with nausea but but no vomiting.   Assessment & Plan:   Acute abd pain, epigastric secondary to CBD stone - pt is s/p ERCP with sphincterotomy and biliary stone extractions yesterday. - please note that not all of the stones from the CBD were able to be removed and pt now has a biliary stent in place - no complaints of abdominal pain - diet advanced to soft and pt tolerating well   Hypokalemia - supplemented - BMP in AM  Leukocytosis - suspect secondary to the abd pain and CBD stone - resolved   Transaminitis with hyperbilirubinemia  - due to the CBD stone - LFT's trending down  Drop in Hg - no signs of active bleed, suspect some dilutional component from IVF - CBC in AM  DVT prophylaxis: Lovenox SQ Code Status: Full  Family Communication: Patient at bedside  Disposition Plan: SNF, GI team cleared for discharge with close follow up, suspect pt can go in AM  Consultants:   [facility]   Procedures:   ERCP  Antimicrobials:   None  Procedures/Studies: Ct Abdomen Pelvis W Contrast Result Date: 03/10/2016 Choledocholithiasis with increasing CBD and intrahepatic biliary dilatation. CBD now measures 17 mm, previously 8 mm.   Dg Ercp Biliary & Pancreatic Ducts Result Date: 03/11/2016 Limited images during ERCP and plastic biliary stent placement.   Subjective: No events overnight. Pt anxious to go home today.   Objective: Vitals:   03/12/16 1352 03/12/16 2040 03/13/16 0528 03/13/16 1302  BP: 128/62 (!) 133/58 (!) 157/55 (!) 142/62  Pulse: 71 84 88 74  Resp: 17 18 20 18   Temp: 98 F (36.7 C) 99.1 F (37.3 C) 99.3 F (37.4 C)  98.5 F (36.9 C)  TempSrc: Oral Oral Oral Oral  SpO2: 100% 95% 92% 92%  Weight:      Height:        Intake/Output Summary (Last 24 hours) at 03/13/16 1535 Last data filed at 03/13/16 0653  Gross per 24 hour  Intake          1483.75 ml  Output             1300 ml  Net           183.75 ml   Filed Weights   03/10/16 2143  Weight: 45.1 kg (99 lb 8 oz)    Examination:  General exam: Appears calm and comfortable  Respiratory system: Clear to auscultation. Respiratory effort normal. Cardiovascular system: S1 & S2 heard, RRR. No JVD, rubs, gallops or clicks. No pedal edema. Gastrointestinal system: Abdomen is nondistended.No organomegaly or masses felt. Normal bowel sounds heard. Central nervous system: Alert and oriented. No focal neurological deficits.  Data Reviewed: I have personally reviewed following labs and imaging studies  CBC:  Recent Labs Lab 03/10/16 1355 03/11/16 0438 03/12/16 0436  WBC 12.2* 5.3 7.3  NEUTROABS  --   --  6.3  HGB 11.0* 9.0* 8.5*  HCT 31.3* 27.0* 24.3*  MCV 103.6* 101.5* 101.3*  PLT 212 171 145*   Basic Metabolic Panel:  Recent Labs Lab 03/10/16 1355 03/11/16 0438 03/12/16 0436 03/13/16 0337  NA 134* 136 136 139  K 3.8 3.2* 3.7 3.3*  CL 97* 102 104 107  CO2 26 27 21* 26  GLUCOSE 104* 84 129* 109*  BUN 18 11 13 9   CREATININE 0.96 0.79 1.00 0.81  CALCIUM 9.9 9.1 8.4* 8.0*   Liver Function Tests:  Recent Labs Lab 03/10/16 1355 03/11/16 0438 03/12/16 0436 03/13/16 0337  AST 284* 137* 71* 47*  ALT 280* 179* 122* 93*  ALKPHOS 451* 368* 280* 240*  BILITOT 5.0* 3.6* 1.5* 1.2  PROT 7.1 5.5* 5.2* 4.9*  ALBUMIN 4.0 2.9* 2.9* 2.8*    Recent Labs Lab 03/10/16 1355  LIPASE 25   Cardiac Enzymes:  Recent Labs Lab 03/10/16 1355  TROPONINI <0.03   Urine analysis:    Component Value Date/Time   COLORURINE YELLOW (A) 03/10/2016 1743   APPEARANCEUR CLEAR (A) 03/10/2016 1743   APPEARANCEUR Clear 07/13/2013 0540   LABSPEC  1.014 03/10/2016 1743   LABSPEC 1.011 07/13/2013 0540   PHURINE 7.0 03/10/2016 1743   GLUCOSEU NEGATIVE 03/10/2016 1743   GLUCOSEU Negative 07/13/2013 0540   HGBUR NEGATIVE 03/10/2016 1743   BILIRUBINUR NEGATIVE 03/10/2016 1743   BILIRUBINUR Negative 07/13/2013 0540   KETONESUR TRACE (A) 03/10/2016 1743   PROTEINUR NEGATIVE 03/10/2016 1743   NITRITE NEGATIVE 03/10/2016 1743   LEUKOCYTESUR NEGATIVE 03/10/2016 1743   LEUKOCYTESUR Negative 07/13/2013 0540   Radiology Studies: No results found.    Scheduled Meds: . enoxaparin (LOVENOX) injection  30 mg Subcutaneous Q24H  . metoprolol  50 mg Oral BID   Continuous Infusions:    LOS: 3 days   Time spent: 20 minutes   07/15/2013, MD [facility] Pager [phone]  If 7PM-7AM, please contact night-coverage [email or web address] Password [email or web address] 03/13/2016, 3:35 PM

## 2016-03-13 NOTE — Discharge Summary (Signed)
Physician Discharge Summary  Susan Blackwell FEO:[id number] DOB: [birth year] DOA: 03/10/2016  PCP: [name], MD  Admit date: 03/10/2016 Discharge date: 03/13/2016  Recommendations for Outpatient Follow-up:  1. Pt will need to follow up with PCP in 2-3 weeks post discharge 2. Please obtain BMP to evaluate electrolytes and kidney function 3. Must hold aspirin for three days, no NSAID's 4. Physician at SNF to decide if pt ok to take narcotic medication if pt has regular bowel regimen   Discharge Diagnoses:  Active Problems:   HTN (hypertension)   Common bile duct dilatation   Bile duct stone  Discharge Condition: Stable  Diet recommendation: Heart healthy diet discussed in details   Brief Narrative:  Pt initially presented to [facility] with abd pain several weeks in duration, worse with eating and associated with nausea but but no vomiting.   Assessment & Plan:   Acute abd pain, epigastric secondary to CBD stone - pt is s/p ERCP with sphincterotomy and biliary stone extractions yesterday. - please note that not all of the stones from the CBD were able to be removed and pt now has a biliary stent in place - no complaints of abdominal pain - diet advanced to soft and pt tolerating well   Hypokalemia - supplemented prior to discharge   Leukocytosis - suspect secondary to the abd pain and CBD stone - resolved   Transaminitis with hyperbilirubinemia  - due to the CBD stone - LFT's trending down  Drop in Hg - no signs of active bleed, suspect some dilutional component from IVF  DVT prophylaxis: Lovenox SQ Code Status: Full  Family Communication: Patient at bedside  Disposition Plan: SNF, GI team cleared for discharge with close follow up   Consultants:   [facility]   Procedures:   ERCP  Antimicrobials:   None  Procedures/Studies: Ct Abdomen Pelvis W Contrast Result Date: 03/10/2016 Choledocholithiasis with increasing CBD and  intrahepatic biliary dilatation. CBD now measures 17 mm, previously 8 mm.   Dg Ercp Biliary & Pancreatic Ducts Result Date: 03/11/2016 Limited images during ERCP and plastic biliary stent placement.   Discharge Exam: Vitals:   03/13/16 0528 03/13/16 1302  BP: (!) 157/55 (!) 142/62  Pulse: 88 74  Resp: 20 18  Temp: 99.3 F (37.4 C) 98.5 F (36.9 C)   Vitals:   03/12/16 1352 03/12/16 2040 03/13/16 0528 03/13/16 1302  BP: 128/62 (!) 133/58 (!) 157/55 (!) 142/62  Pulse: 71 84 88 74  Resp: 17 18 20 18   Temp: 98 F (36.7 C) 99.1 F (37.3 C) 99.3 F (37.4 C) 98.5 F (36.9 C)  TempSrc: Oral Oral Oral Oral  SpO2: 100% 95% 92% 92%  Weight:      Height:        General: Pt is alert, follows commands appropriately, not in acute distress Cardiovascular: Regular rate and rhythm, no rubs, no gallops Respiratory: Clear to auscultation bilaterally, no wheezing, no crackles, no rhonch iAbdominal: Soft, non tender, non distended, bowel sounds +, no guarding  Discharge Instructions  Discharge Instructions    Diet - low sodium heart healthy    Complete by:  As directed    Diet - low sodium heart healthy    Complete by:  As directed    Diet - low sodium heart healthy    Complete by:  As directed    Increase activity slowly    Complete by:  As directed    Increase activity slowly    Complete by:  As directed    Increase activity slowly    Complete by:  As directed        Medication List    STOP taking these medications   aspirin EC 81 MG tablet     TAKE these medications   ARNICARE Gel Apply 1 application topically.   CALTRATE 600+D PLUS MINERALS 600-800 MG-UNIT Tabs Take 1 tablet by mouth daily.   Cholecalciferol 2000 units Tabs Take 1 tablet by mouth daily.   dimenhyDRINATE 50 MG tablet Commonly known as:  DRAMAMINE Take 50 mg by mouth every 8 (eight) hours as needed.   hydrochlorothiazide 25 MG tablet Commonly known as:  HYDRODIURIL Take 25 mg by mouth daily.    HYDROcodone-acetaminophen 5-325 MG tablet Commonly known as:  NORCO/VICODIN Take 1 tablet by mouth every 4 (four) hours as needed. 1 tablet at 1900, 0.5 tablet at 0000   metoprolol 50 MG tablet Commonly known as:  LOPRESSOR Take 50 mg by mouth 2 (two) times daily.   potassium chloride 10 MEQ tablet Commonly known as:  K-DUR Take 1 tablet by mouth daily.   THERA-GESIC PLUS Crea Apply 1 application topically at bedtime.   valsartan 320 MG tablet Commonly known as:  DIOVAN Take 320 mg by mouth daily.   vitamin B-12 1000 MCG tablet Commonly known as:  CYANOCOBALAMIN Take 1,000 mcg by mouth daily.         Contact information for follow-up providers    DAVID Terance Hart, MD Follow up.   Specialty:  Family Medicine Contact information: [address] [place] [address] [phone]        [facility] E, MD. Schedule an appointment as soon as possible for a visit.   Specialty:  Gastroenterology Contact information: [address] [place] [address] [phone]            Contact information for after-discharge care    Destination    [facility] .   Specialty:  Skilled Nursing Facility Contact information: [address] [place] [address] [phone]                   The results of significant diagnostics from this hospitalization (including imaging, microbiology, ancillary and laboratory) are listed below for reference.     Microbiology: No results found for this or any previous visit (from the past 240 hour(s)).   Labs: Basic Metabolic Panel:  Recent Labs Lab 03/10/16 1355 03/11/16 0438 03/12/16 0436 03/13/16 0337  NA 134* 136 136 139  K 3.8 3.2* 3.7 3.3*  CL 97* 102 104 107  CO2 26 27 21* 26  GLUCOSE 104* 84 129* 109*  BUN 18 11 13 9   CREATININE 0.96 0.79 1.00 0.81  CALCIUM 9.9 9.1 8.4* 8.0*   Liver Function Tests:  Recent Labs Lab 03/10/16 1355 03/11/16 0438 03/12/16 0436  03/13/16 0337  AST 284* 137* 71* 47*  ALT 280* 179* 122* 93*  ALKPHOS 451* 368* 280* 240*  BILITOT 5.0* 3.6* 1.5* 1.2  PROT 7.1 5.5* 5.2* 4.9*  ALBUMIN 4.0 2.9* 2.9* 2.8*    Recent Labs Lab 03/10/16 1355  LIPASE 25   No results for input(s): AMMONIA in the last 168 hours. CBC:  Recent Labs Lab 03/10/16 1355 03/11/16 0438 03/12/16 0436  WBC 12.2* 5.3 7.3  NEUTROABS  --   --  6.3  HGB 11.0* 9.0* 8.5*  HCT 31.3* 27.0* 24.3*  MCV 103.6* 101.5* 101.3*  PLT 212 171 145*   Cardiac  Enzymes:  Recent Labs Lab 03/10/16 1355  TROPONINI <0.03   BNP: BNP (last 3 results) No results for input(s): BNP in the last 8760 hours.  ProBNP (last 3 results) No results for input(s): PROBNP in the last 8760 hours.  CBG: No results for input(s): GLUCAP in the last 168 hours.   SIGNED: Time coordinating discharge: Over 30 minutes  Debbora Presto, MD  [facility] 03/13/2016, 3:33 PM Pager [phone]  If 7PM-7AM, please contact night-coverage [email or web address] Password [email or web address]

## 2016-03-13 NOTE — Progress Notes (Signed)
Physical Therapy Evaluation Patient Details Name: Susan Blackwell MRN: [id number] DOB: June 27, 1919 Today's Date: 03/13/2016   History of Present Illness  Pt is a 82 y/o female who presented to the ED with white stools and abdominal pain which had been going on for several weeks before admission. CT showed choledocholithiasis with increasing common bile duct and intrahepatic biliary dilatation. Pt was previously independent and still drove to the grocery store. Pt has significant PMH of HTN and cholecystectomy   Clinical Impression  Pt able to ambulate with min guard assist for safety. Trialed ambulation without RW, but pt demonstrated narrowed BoS and decreased ability to balance, putting her at increased risk of falls. Pt would benefit from use of RW and balance training to assist with return to baseline function. Pt does not have 24/7 assistance at home, and wishes to return to her previous independent lifestyle. Because of her falls risk and goals to return to [facility], she would be a good candidate for SNF following hospital discharge.     Follow Up Recommendations SNF    Equipment Recommendations  Rolling walker with 5" wheels    Recommendations for Other Services       Precautions / Restrictions Precautions Precautions: None Restrictions Weight Bearing Restrictions: No      Mobility  Bed Mobility Overal bed mobility: Independent                Transfers Overall transfer level: Needs assistance Equipment used: Rolling walker (2 wheeled) Transfers: Sit to/from Stand Sit to Stand: Supervision         General transfer comment: pt able to transfer without assist; supervision for safety as pt c/o dizziness upon sitting  Ambulation/Gait Ambulation/Gait assistance: Min guard Ambulation Distance (Feet): 150 Feet Assistive device: Rolling walker (2 wheeled) Gait Pattern/deviations: Decreased stride length;Step-through pattern Gait velocity: decreased Gait velocity  interpretation: Below normal speed for age/gender General Gait Details: min guard for safety  Stairs            Wheelchair Mobility    Modified Rankin (Stroke Patients Only)       Balance Overall balance assessment: Needs assistance Sitting-balance support: Feet supported;No upper extremity supported Sitting balance-Leahy Scale: Good Sitting balance - Comments: Pt able to sit EOB to eat breakfast   Standing balance support: No upper extremity supported Standing balance-Leahy Scale: Fair Standing balance comment: Pt able to maintain static standing but required RW to assist with ambulation                             Pertinent Vitals/Pain Pain Assessment: 0-10 Pain Score: 0-No pain    Home Living Family/patient expects to be discharged to:: Skilled nursing facility                      Prior Function Level of Independence: Independent               Hand Dominance   Dominant Hand: Right    Extremity/Trunk Assessment   Upper Extremity Assessment: Overall WFL for tasks assessed           Lower Extremity Assessment: Overall WFL for tasks assessed         Communication   Communication: No difficulties  Cognition Arousal/Alertness: Awake/alert Behavior During Therapy: WFL for tasks assessed/performed Overall Cognitive Status: Within Functional Limits for tasks assessed  General Comments      Exercises     Assessment/Plan    PT Assessment Patient needs continued PT services  PT Problem List Decreased strength;Decreased activity tolerance;Decreased balance;Decreased safety awareness          PT Treatment Interventions Gait training;DME instruction;Therapeutic activities;Therapeutic exercise;Balance training;Patient/family education    PT Goals (Current goals can be found in the Care Plan section)  Acute Rehab PT Goals Patient Stated Goal: to regain strength PT Goal Formulation: With  patient Time For Goal Achievement: 03/20/16 Potential to Achieve Goals: Good    Frequency Min 3X/week   Barriers to discharge        Co-evaluation               End of Session Equipment Utilized During Treatment: Gait belt Activity Tolerance: Patient tolerated treatment well Patient left: in chair;with call bell/phone within reach;with chair alarm set Nurse Communication: Mobility status         Time: 6599-3570 PT Time Calculation (min) (ACUTE ONLY): 27 min   Charges:   PT Evaluation $PT Eval Moderate Complexity: 1 Procedure PT Treatments $Gait Training: 8-22 mins   PT G Codes:        Gaye Pollack 03-23-2016, 12:32 PM Gaye Pollack, SPT [phone]

## 2016-03-13 NOTE — NC FL2 (Signed)
[place] MEDICAID FL2 LEVEL OF CARE SCREENING TOOL     IDENTIFICATION  Patient Name: Toyoko Silos Birthdate: 1919-05-01 Sex: female Admission Date (Current Location): 03/10/2016  [facility] and [place] Number:  Producer, television/film/video and Address:  [facility] [place], [address], [place], [place] [address]      Provider Number: [id number]  Attending Physician Name and Address:  Dorothea Ogle, MD  Relative Name and Phone Number:       Current Level of Care: Hospital Recommended Level of Care: Skilled Nursing Facility Prior Approval Number:    Date Approved/Denied:   PASRR Number:   [id number]   Discharge Plan: SNF    Current Diagnoses: Patient Active Problem List   Diagnosis Date Noted  . Bile duct stone 03/11/2016  . HTN (hypertension) 03/10/2016  . Common bile duct calculus 03/10/2016  . Common bile duct dilatation 03/10/2016    Orientation RESPIRATION BLADDER Height & Weight     Self, Time, Situation, Place  Normal Continent Weight: 99 lb 8 oz (45.1 kg) Height:  4\' 9"  (144.8 cm)  BEHAVIORAL SYMPTOMS/MOOD NEUROLOGICAL BOWEL NUTRITION STATUS      Continent Diet (regular)  AMBULATORY STATUS COMMUNICATION OF NEEDS Skin   Supervision Verbally Normal                       Personal Care Assistance Level of Assistance  Bathing, Feeding, Dressing Bathing Assistance: Limited assistance Feeding assistance: Independent Dressing Assistance: Limited assistance     Functional Limitations Info  Sight, Hearing, Speech Sight Info: Adequate Hearing Info: Impaired Speech Info: Adequate    SPECIAL CARE FACTORS FREQUENCY  PT (By licensed PT), OT (By licensed OT)     PT Frequency: 5x OT Frequency: 5x            Contractures Contractures Info: Not present    Additional Factors Info  Code Status, Allergies Code Status Info: Full Code Allergies Info: Tramadol           Current Medications (03/13/2016):  This is the current  hospital active medication list Current Facility-Administered Medications  Medication Dose Route Frequency Provider Last Rate Last Dose  . alum & mag hydroxide-simeth (MAALOX/MYLANTA) 200-200-20 MG/5ML suspension 15 mL  15 mL Oral Q6H PRN 10-30-2000, DO   15 mL at 03/12/16 2011  . dimenhyDRINATE (DRAMAMINE) tablet 50 mg  50 mg Oral Q8H PRN 2012, MD      . enoxaparin (LOVENOX) injection 30 mg  30 mg Subcutaneous Q24H Dorothea Ogle, DO   30 mg at 03/12/16 2019  . hydrALAZINE (APRESOLINE) injection 10 mg  10 mg Intravenous Q6H PRN 2020, DO      . HYDROcodone-acetaminophen (NORCO/VICODIN) 5-325 MG per tablet 1 tablet  1 tablet Oral Q4H PRN Joseph Art, MD   1 tablet at 03/13/16 1218  . ketorolac (TORADOL) 15 MG/ML injection 15 mg  15 mg Intravenous Q6H PRN 1219, DO   15 mg at 03/13/16 0533  . metoprolol (LOPRESSOR) tablet 50 mg  50 mg Oral BID 03/15/16, MD   50 mg at 03/13/16 03/15/16  . naphazoline-glycerin (CLEAR EYES) ophth solution 1-2 drop  1-2 drop Both Eyes QID PRN 7253, MD   2 drop at 03/11/16 1916  . ondansetron (ZOFRAN) tablet 4 mg  4 mg Oral Q6H PRN 03/13/16, DO       Or  . ondansetron [facility])  injection 4 mg  4 mg Intravenous Q6H PRN Joseph Art, DO      . phenol (CHLORASEPTIC) mouth spray 1 spray  1 spray Mouth/Throat PRN Joseph Art, DO   1 spray at 03/12/16 2015     Discharge Medications: Please see discharge summary for a list of discharge medications.  Relevant Imaging Results:  Relevant Lab Results:   Additional Information  SSN:  [id number]  Raye Sorrow, [place]

## 2016-03-13 NOTE — Clinical Social Work Placement (Signed)
   CLINICAL SOCIAL WORK PLACEMENT  NOTE  Date:  03/13/2016  Patient Details  Name: Susan Blackwell MRN: [id number] Date of Birth: 1920/03/05  Clinical Social Work is seeking post-discharge placement for this patient at the Skilled  Nursing Facility level of care (*CSW will initial, date and re-position this form in  chart as items are completed):  Yes   Patient/family provided with [facility] Clinical Social Work Department's list of facilities offering this level of care within the geographic area requested by the patient (or if unable, by the patient's family).  Yes   Patient/family informed of their freedom to choose among providers that offer the needed level of care, that participate in Medicare, Medicaid or managed care program needed by the patient, have an available bed and are willing to accept the patient.  Yes   Patient/family informed of [facility]'s ownership interest in [facility] and [facility], as well as of the fact that they are under no obligation to receive care at these facilities.  PASRR submitted to EDS on 03/13/16     PASRR number received on 03/13/16     Existing PASRR number confirmed on       FL2 transmitted to all facilities in geographic area requested by pt/family on 03/13/16     FL2 transmitted to all facilities within larger geographic area on       Patient informed that his/her managed care company has contracts with or will negotiate with certain facilities, including the following:        Yes   Patient/family informed of bed offers received.  Patient chooses bed at [facility]     Physician recommends and patient chooses bed at [facility]    Patient to be transferred to [facility] on 03/13/16.  Patient to be transferred to facility by EMS     Patient family notified on 03/13/16 of transfer.  Name of family member notified:  Ivey family friend     PHYSICIAN Please sign FL2     Additional Comment:     _______________________________________________ Raye Sorrow, LCSW 03/13/2016, 1:37 PM

## 2016-03-14 ENCOUNTER — Inpatient Hospital Stay (HOSPITAL_COMMUNITY): Payer: Medicare Other

## 2016-03-14 DIAGNOSIS — R509 Fever, unspecified: Secondary | ICD-10-CM

## 2016-03-14 DIAGNOSIS — R5081 Fever presenting with conditions classified elsewhere: Secondary | ICD-10-CM

## 2016-03-14 DIAGNOSIS — I10 Essential (primary) hypertension: Secondary | ICD-10-CM

## 2016-03-14 LAB — BASIC METABOLIC PANEL
Anion gap: 6 (ref 5–15)
BUN: 8 mg/dL (ref 6–20)
CO2: 26 mmol/L (ref 22–32)
Calcium: 8 mg/dL — ABNORMAL LOW (ref 8.9–10.3)
Chloride: 104 mmol/L (ref 101–111)
Creatinine, Ser: 0.78 mg/dL (ref 0.44–1.00)
Glucose, Bld: 102 mg/dL — ABNORMAL HIGH (ref 65–99)
POTASSIUM: 4.1 mmol/L (ref 3.5–5.1)
SODIUM: 136 mmol/L (ref 135–145)

## 2016-03-14 LAB — URINALYSIS, ROUTINE W REFLEX MICROSCOPIC
Bilirubin Urine: NEGATIVE
Glucose, UA: NEGATIVE mg/dL
Hgb urine dipstick: NEGATIVE
Ketones, ur: NEGATIVE mg/dL
Leukocytes, UA: NEGATIVE
Nitrite: NEGATIVE
Protein, ur: NEGATIVE mg/dL
Specific Gravity, Urine: 1.009 (ref 1.005–1.030)
pH: 7.5 (ref 5.0–8.0)

## 2016-03-14 LAB — CBC
HCT: 25.9 % — ABNORMAL LOW (ref 36.0–46.0)
Hemoglobin: 8.5 g/dL — ABNORMAL LOW (ref 12.0–15.0)
MCH: 34.4 pg — AB (ref 26.0–34.0)
MCHC: 32.8 g/dL (ref 30.0–36.0)
MCV: 104.9 fL — ABNORMAL HIGH (ref 78.0–100.0)
PLATELETS: 179 10*3/uL (ref 150–400)
RBC: 2.47 MIL/uL — AB (ref 3.87–5.11)
RDW: 13.9 % (ref 11.5–15.5)
WBC: 8 10*3/uL (ref 4.0–10.5)

## 2016-03-14 MED ORDER — FUROSEMIDE 10 MG/ML IJ SOLN
40.0000 mg | Freq: Once | INTRAMUSCULAR | Status: AC
  Administered 2016-03-14: 40 mg via INTRAVENOUS
  Filled 2016-03-14: qty 4

## 2016-03-14 MED ORDER — HYDROCODONE-ACETAMINOPHEN 5-325 MG PO TABS: 1.0000 | ORAL_TABLET | ORAL | 0 refills | Status: DC | PRN

## 2016-03-14 MED ORDER — PIPERACILLIN-TAZOBACTAM 3.375 G IVPB
3.3750 g | Freq: Three times a day (TID) | INTRAVENOUS | Status: DC
  Administered 2016-03-14 – 2016-03-16 (×5): 3.375 g via INTRAVENOUS
  Filled 2016-03-14 (×7): qty 50

## 2016-03-14 MED ORDER — ACETAMINOPHEN 325 MG PO TABS: 650.0000 mg | ORAL_TABLET | Freq: Four times a day (QID) | ORAL | Status: DC | PRN

## 2016-03-14 MED ORDER — VANCOMYCIN HCL IN DEXTROSE 1-5 GM/200ML-% IV SOLN
1000.0000 mg | Freq: Once | INTRAVENOUS | Status: AC
  Administered 2016-03-14: 1000 mg via INTRAVENOUS
  Filled 2016-03-14: qty 200

## 2016-03-14 MED ORDER — VALSARTAN 320 MG PO TABS: 160.0000 mg | ORAL_TABLET | Freq: Every day | ORAL | 1 refills | Status: DC

## 2016-03-14 MED ORDER — PIPERACILLIN-TAZOBACTAM 3.375 G IVPB 30 MIN
3.3750 g | Freq: Once | INTRAVENOUS | Status: AC
  Administered 2016-03-14: 3.375 g via INTRAVENOUS
  Filled 2016-03-14: qty 50

## 2016-03-14 MED ORDER — SODIUM CHLORIDE 0.9 % IV SOLN
500.0000 mg | INTRAVENOUS | Status: DC
  Administered 2016-03-15: 500 mg via INTRAVENOUS
  Filled 2016-03-14 (×2): qty 500

## 2016-03-14 MED ORDER — LORAZEPAM 0.5 MG PO TABS: 0.5000 mg | ORAL_TABLET | Freq: Every evening | ORAL | 0 refills | Status: DC | PRN

## 2016-03-14 NOTE — Progress Notes (Signed)
Pharmacy Antibiotic Note  Susan Blackwell is a 82 y.o. female admitted on 03/10/2016 with white stools and abdominal pain. Now with fever and possible HCAP. Pharmacy has been consulted for Zosyn and vancomycin dosing.  Day #1 of abx for HCAP. Tmax of 101.4, WBC wnl. SCr stable, CrCl ~40ml/min.  Plan: Give Zosyn 3.375g IV (30 min infusion) x 1, then start Zosyn 3.375 gm IV q8h (4 hour infusion) Give vancomycin 1g IV x 1, then start vancomycin 500mg  IV Q24 Monitor clinical picture, renal function, VT prn F/U C&S, abx deescalation / LOT  Height: 4\' 9"  (144.8 cm) Weight: 99 lb 8 oz (45.1 kg) IBW/kg (Calculated) : 38.6  Temp (24hrs), Avg:100.5 F (38.1 C), Min:99.8 F (37.7 C), Max:101.4 F (38.6 C)   Recent Labs Lab 03/10/16 1355 03/11/16 0438 03/12/16 0436 03/13/16 0337 03/14/16 0422  WBC 12.2* 5.3 7.3  --  8.0  CREATININE 0.96 0.79 1.00 0.81 0.78    Estimated Creatinine Clearance: 26.2 mL/min (by C-G formula based on SCr of 0.78 mg/dL).    Allergies  Allergen Reactions  . Tramadol Nausea Only    Antimicrobials this admission: Zosyn 11/22 >>  Vancomycin 11/22 >>   Dose adjustments this admission: n/a  Microbiology results: 11/22 BCx: sent  Thank you for allowing pharmacy to be a part of this patient's care.  12/22 03/14/2016 3:01 PM

## 2016-03-14 NOTE — Discharge Summary (Addendum)
Physician Discharge Summary  Susan Blackwell QIH:[id number] DOB: December 11, 1919 DOA: 03/10/2016  PCP: [name], MD  Admit date: 03/10/2016 Discharge date: 03/13/2016  Recommendations for Outpatient Follow-up:  1. Pt will need to follow up with PCP in 2-3 weeks post discharge 2. Please obtain BMP to evaluate electrolytes and kidney function 3. Must hold aspirin for three days, no NSAID's 4. Physician at SNF to decide if pt ok to take narcotic medication if pt has regular bowel regimen  5. Follow up with Dr Ewing Schlein , in 1-2 weeks    Addendum: Dc cancelled due to fever and hypoxia Now cxr confirmed HCAP Started patient on broad spectrum abx   Discharge Diagnoses:  Active Problems:   HTN (hypertension)   Common bile duct dilatation   Bile duct stone  Discharge Condition: Stable  Diet recommendation: Heart healthy diet discussed in details   Brief Narrative: 83 y.o. female with medical history significant of HTN and osteoarthritis.. She's had a several week history of not feeling well. over the last few days she's had light almost white stools.She was recently seen by her PCP to check lab work and found that her liver enzymes were elevated. She was to have a liver ultrasound on Monday. She will unfortunately was unable to do this as she had worsening pain and vomiting. In the ER [place] she had a CT scan of her abdomen.  This showed choledocholithiasis with increasing common bile duct and intrahepatic biliary dilatation. Common bile duct was previously 8 mm and is now 17 mm. GI coverage at [facility] was contacted but they unfortunately did not do ERCPs. Patient was referred to [facility] for GI evaluation and possible ERCP    Assessment & Plan:   Acute abd pain, epigastric secondary to CBD stone - pt is s/p ERCP with sphincterotomy and biliary stone extractions 11/19 by Dr Evette Cristal . - please note that not all of the stones from the CBD were able to be removed and pt now has a biliary  stent in place - no complaints of abdominal pain - diet advanced to soft and pt tolerating well   Hypokalemia - supplemented prior to discharge   Leukocytosis - suspect secondary to the abd pain and CBD stone - resolved   Transaminitis with hyperbilirubinemia  - due to the CBD stone - LFT's trending down  Drop in Hg - no signs of active bleed, suspect some dilutional component from IVF  Fever 11/22 cxr ,ua done prior to DC , to r/o PNA/UTI WBC normal    DVT prophylaxis: Lovenox SQ Code Status: Full  Family Communication: Patient at bedside  Disposition Plan: SNF   Consultants:   [facility]   Procedures:   ERCP  Antimicrobials:   None  Procedures/Studies: Ct Abdomen Pelvis W Contrast Result Date: 03/10/2016 Choledocholithiasis with increasing CBD and intrahepatic biliary dilatation. CBD now measures 17 mm, previously 8 mm.   Dg Ercp Biliary & Pancreatic Ducts Result Date: 03/11/2016 Limited images during ERCP and plastic biliary stent placement.   Discharge Exam: Vitals:   03/13/16 2202 03/14/16 0549  BP: (!) 162/61 (!) 156/61  Pulse: 97 87  Resp: 18 17  Temp: 100.2 F (37.9 C) 99.8 F (37.7 C)   Vitals:   03/13/16 0528 03/13/16 1302 03/13/16 2202 03/14/16 0549  BP: (!) 157/55 (!) 142/62 (!) 162/61 (!) 156/61  Pulse: 88 74 97 87  Resp: 20 18 18 17   Temp: 99.3 F (37.4 C) 98.5 F (36.9 C) 100.2 F (37.9 C)  99.8 F (37.7 C)  TempSrc: Oral Oral Oral Oral  SpO2: 92% 92% 100% 97%  Weight:      Height:        General: Pt is alert, follows commands appropriately, not in acute distress Cardiovascular: Regular rate and rhythm, no rubs, no gallops Respiratory: Clear to auscultation bilaterally, no wheezing, no crackles, no rhonch iAbdominal: Soft, non tender, non distended, bowel sounds +, no guarding  Discharge Instructions  Discharge Instructions    Diet - low sodium heart healthy    Complete by:  As directed    Diet - low sodium heart  healthy    Complete by:  As directed    Diet - low sodium heart healthy    Complete by:  As directed    Diet - low sodium heart healthy    Complete by:  As directed    Increase activity slowly    Complete by:  As directed    Increase activity slowly    Complete by:  As directed    Increase activity slowly    Complete by:  As directed    Increase activity slowly    Complete by:  As directed        Medication List    STOP taking these medications   aspirin EC 81 MG tablet   hydrochlorothiazide 25 MG tablet Commonly known as:  HYDRODIURIL     TAKE these medications   ARNICARE Gel Apply 1 application topically.   CALTRATE 600+D PLUS MINERALS 600-800 MG-UNIT Tabs Take 1 tablet by mouth daily.   Cholecalciferol 2000 units Tabs Take 1 tablet by mouth daily.   dimenhyDRINATE 50 MG tablet Commonly known as:  DRAMAMINE Take 50 mg by mouth every 8 (eight) hours as needed.   HYDROcodone-acetaminophen 5-325 MG tablet Commonly known as:  NORCO/VICODIN Take 1 tablet by mouth every 4 (four) hours as needed. 1 tablet at 1900, 0.5 tablet at 0000   LORazepam 0.5 MG tablet Commonly known as:  ATIVAN Take 1 tablet (0.5 mg total) by mouth at bedtime as needed for anxiety.   metoprolol 50 MG tablet Commonly known as:  LOPRESSOR Take 50 mg by mouth 2 (two) times daily.   potassium chloride 10 MEQ tablet Commonly known as:  K-DUR Take 1 tablet by mouth daily.   THERA-GESIC PLUS Crea Apply 1 application topically at bedtime.   valsartan 320 MG tablet Commonly known as:  DIOVAN Take 0.5 tablets (160 mg total) by mouth daily. What changed:  how much to take   vitamin B-12 1000 MCG tablet Commonly known as:  CYANOCOBALAMIN Take 1,000 mcg by mouth daily.         Contact information for follow-up providers    DAVID Terance Hart, MD Follow up.   Specialty:  Family Medicine Contact information: [address] [place] [address] [phone]        [facility] E, MD.  Schedule an appointment as soon as possible for a visit.   Specialty:  Gastroenterology Contact information: [address] [place] [address] [phone]            Contact information for after-discharge care    Destination    [facility] .   Specialty:  Skilled Nursing Facility Contact information: [address] [place] [address] [phone]                   The results of significant diagnostics from this hospitalization (including imaging, microbiology, ancillary  and laboratory) are listed below for reference.     Microbiology: No results found for this or any previous visit (from the past 240 hour(s)).   Labs: Basic Metabolic Panel:  Recent Labs Lab 03/10/16 1355 03/11/16 0438 03/12/16 0436 03/13/16 0337 03/14/16 0422  NA 134* 136 136 139 136  K 3.8 3.2* 3.7 3.3* 4.1  CL 97* 102 104 107 104  CO2 26 27 21* 26 26  GLUCOSE 104* 84 129* 109* 102*  BUN 18 11 13 9 8   CREATININE 0.96 0.79 1.00 0.81 0.78  CALCIUM 9.9 9.1 8.4* 8.0* 8.0*   Liver Function Tests:  Recent Labs Lab 03/10/16 1355 03/11/16 0438 03/12/16 0436 03/13/16 0337  AST 284* 137* 71* 47*  ALT 280* 179* 122* 93*  ALKPHOS 451* 368* 280* 240*  BILITOT 5.0* 3.6* 1.5* 1.2  PROT 7.1 5.5* 5.2* 4.9*  ALBUMIN 4.0 2.9* 2.9* 2.8*    Recent Labs Lab 03/10/16 1355  LIPASE 25   No results for input(s): AMMONIA in the last 168 hours. CBC:  Recent Labs Lab 03/10/16 1355 03/11/16 0438 03/12/16 0436 03/14/16 0422  WBC 12.2* 5.3 7.3 8.0  NEUTROABS  --   --  6.3  --   HGB 11.0* 9.0* 8.5* 8.5*  HCT 31.3* 27.0* 24.3* 25.9*  MCV 103.6* 101.5* 101.3* 104.9*  PLT 212 171 145* 179   Cardiac Enzymes:  Recent Labs Lab 03/10/16 1355  TROPONINI <0.03   BNP: BNP (last 3 results) No results for input(s): BNP in the last 8760 hours.  ProBNP (last 3 results) No results for input(s): PROBNP in the last 8760 hours.  CBG: No  results for input(s): GLUCAP in the last 168 hours.   SIGNED: Time coordinating discharge: Over 30 minutes  03/12/16, MD  [facility] 03/14/2016, 1:00 PM Pager [phone]  If 7PM-7AM, please contact night-coverage [email or web address] Password [email or web address]

## 2016-03-14 NOTE — Care Management Important Message (Signed)
Important Message  Patient Details  Name: Susan Blackwell MRN: [id number] Date of Birth: 07-11-1919   Medicare Important Message Given:  Yes    Haidan Nhan 03/14/2016, 1:40 PM

## 2016-03-14 NOTE — Progress Notes (Addendum)
1:39 PM Patient will discharge to SNF today.   She will go to [facility]	[place] [facility] by EMS. Family in room and aware of plan.  DC today.  LCSW has spoken with patient and family. All aware of delay in discharge and hopeful for DC to SNF today.  Updated CM and awaiting new orders if patient will DC. Called facility and left message regarding possible transfer.  Will continue to follow.  Deretha Emory, MSW Clinical Social Work: Optician, dispensing Coverage for :  [phone]

## 2016-03-15 ENCOUNTER — Encounter (HOSPITAL_COMMUNITY): Payer: Self-pay | Admitting: General Practice

## 2016-03-15 DIAGNOSIS — K838 Other specified diseases of biliary tract: Secondary | ICD-10-CM

## 2016-03-15 NOTE — Progress Notes (Signed)
[facility] PROGRESS NOTE  Susan Blackwell ZMO:[id number] DOB: 23-Jul-1919 DOA: 03/10/2016   PCP: [name], MD     Assessment/Plan: Active Problems:   HTN (hypertension)   Common bile duct dilatation   Bile duct stone   Fever    Brief Narrative:82 y.o.femalewith medical history significant of HTN and osteoarthritis.. She's had a several week history of not feeling well. over the last few days she's had light almost white stools.She was recently seen by her PCP to check lab work and found that her liver enzymes were elevated. She was to have a liver ultrasound on Monday. She will unfortunately was unable to do this as she had worsening pain and vomiting. In the ER [place] she had a CT scan of her abdomen. This showed choledocholithiasis with increasing common bile duct and intrahepatic biliary dilatation. Common bile duct was previously 8 mm and is now 17 mm. GI coverage at [facility] was contacted but they unfortunately did not do ERCPs. Patient was referred to [facility] GI evaluation and possible ERCP    Assessment & Plan: Healthcare associated pneumonia Symptoms developed 11/22 Chest x-ray consistent with patchy right perihilar and left mid and lower lung zone airspace disease Patient started on vancomycin and Zosyn Blood culture pending Continue to attempt to wean oxygen to room air  Acute abd pain, epigastric secondary to CBD stone - pt is s/p ERCP with sphincterotomy and biliary stone extractions 11/19 by Dr Evette Cristal . - please note that not all of the stones from the CBD were able to be removed and pt now has a biliary stent in place - no complaints of abdominal pain - diet advanced to soft and pt tolerating well   Hypokalemia - supplemented prior to discharge   Leukocytosis - suspect secondary to the abd pain and CBD stone - resolved   Transaminitis with hyperbilirubinemia  - due to the CBD stone - LFT's trending down  Drop in Hg - no signs  of active bleed, suspect some dilutional component from IVF  Fever 11/22 cxr ,ua done prior to DC , to r/o PNA/UTI WBC normal     DVT prophylaxsis Lovenox  Code Status:  Full code     Family Communication: Discussed in detail with the patient, all imaging results, lab results explained to the patient   Disposition Plan: Anticipate discharge to SNF in one to 2 days       Consultants:   [facility]   Procedures:     ERCP  11/19   Antibiotics: Anti-infectives    Start     Dose/Rate Route Frequency Ordered Stop   03/15/16 1700  vancomycin (VANCOCIN) 500 mg in sodium chloride 0.9 % 100 mL IVPB     500 mg 100 mL/hr over 60 Minutes Intravenous Every 24 hours 03/14/16 1507     03/14/16 2200  piperacillin-tazobactam (ZOSYN) IVPB 3.375 g     3.375 g 12.5 mL/hr over 240 Minutes Intravenous Every 8 hours 03/14/16 1507     03/14/16 1530  vancomycin (VANCOCIN) IVPB 1000 mg/200 mL premix     1,000 mg 200 mL/hr over 60 Minutes Intravenous  Once 03/14/16 1501 03/14/16 1705   03/14/16 1530  piperacillin-tazobactam (ZOSYN) IVPB 3.375 g     3.375 g 100 mL/hr over 30 Minutes Intravenous  Once 03/14/16 1501 03/14/16 1635         HPI/Subjective: Afebrile overnight, hypoxia is improving  Objective: Vitals:   03/14/16 0549 03/14/16 1443 03/14/16 2140 03/15/16  0442  BP: (!) 156/61 (!) 164/61 (!) 158/63 (!) 127/40  Pulse: 87 84 84 65  Resp: 17 18 17 16   Temp: 99.8 F (37.7 C) (!) 101.4 F (38.6 C) 99.3 F (37.4 C) 97.9 F (36.6 C)  TempSrc: Oral Oral Oral Oral  SpO2: 97% 98% 98% 100%  Weight:      Height:        Intake/Output Summary (Last 24 hours) at 03/15/16 0756 Last data filed at 03/15/16 0553  Gross per 24 hour  Intake             1190 ml  Output             2425 ml  Net            -1235 ml    Exam:  Examination:  General exam: Appears calm and comfortable  Respiratory system: Clear to auscultation. Respiratory effort normal. Cardiovascular system:  S1 & S2 heard, RRR. No JVD, murmurs, rubs, gallops or clicks. No pedal edema. Gastrointestinal system: Abdomen is nondistended, soft and nontender. No organomegaly or masses felt. Normal bowel sounds heard. Central nervous system: Alert and oriented. No focal neurological deficits. Extremities: Symmetric 5 x 5 power. Skin: No rashes, lesions or ulcers Psychiatry: Judgement and insight appear normal. Mood & affect appropriate.     Data Reviewed: I have personally reviewed following labs and imaging studies  Micro Results No results found for this or any previous visit (from the past 240 hour(s)).  Radiology Reports Dg Chest 1 View  Result Date: 03/14/2016 CLINICAL DATA:  Dry cough for 2 days.  Fever today. EXAM: CHEST 1 VIEW COMPARISON:  PA and lateral chest 09/24/2008. FINDINGS: Patchy right perihilar airspace disease identified. There is also hazy opacity in the left mid and lower lung zones. Small bilateral pleural effusions are seen. No pneumothorax. Heart size is normal. Aortic atherosclerosis noted. Convex left scoliosis is identified. IMPRESSION: Patchy right perihilar and left mid and lower lung zone airspace disease is worrisome for pneumonia given history. Atherosclerosis. Electronically Signed   By: 11/24/2008 M.D.   On: 03/14/2016 13:56   Ct Abdomen Pelvis W Contrast  Result Date: 03/10/2016 CLINICAL DATA:  82 year old female with abdominal pain and vomiting for 1 day. EXAM: CT ABDOMEN AND PELVIS WITH CONTRAST TECHNIQUE: Multidetector CT imaging of the abdomen and pelvis was performed using the standard protocol following bolus administration of intravenous contrast. CONTRAST:  46mL ISOVUE-300 IOPAMIDOL (ISOVUE-300) INJECTION 61% COMPARISON:  07/13/2013 FINDINGS: Lower chest: No acute abnormality. Hepatobiliary: Increasing CBD and intrahepatic biliary dilatation identified, with the CBD now measuring 17 mm, previously 8 mm. Multiple slightly higher density structures within  the CBD for compatible with choledocholithiasis. No focal hepatic abnormalities are noted. The patient is status post cholecystectomy. Pancreas: No significant abnormality Spleen: No significant abnormality Adrenals/Urinary Tract: Mild bilateral renal atrophy noted. The adrenal glands and bladder are unremarkable. Stomach/Bowel: There is no evidence of bowel obstruction or definite bowel wall thickening. Sigmoid colonic diverticulosis noted without diverticulitis. Vascular/Lymphatic: Abdominal aortic atherosclerotic calcifications noted without aneurysm. No enlarged lymph nodes are noted. Reproductive: Uterus and bilateral adnexa are unremarkable. Other: No abdominal wall hernia or abnormality. No abdominopelvic ascites. Musculoskeletal: Grade 1 spondylolisthesis at L5-S1 again noted. Multilevel degenerative disc disease, facet arthropathy and spondylosis again noted. IMPRESSION: Choledocholithiasis with increasing CBD and intrahepatic biliary dilatation. CBD now measures 17 mm, previously 8 mm. No other acute abnormalities. Abdominal aortic atherosclerosis. Electronically Signed   By: 07/15/2013.D.  On: 03/10/2016 17:49   Dg Ercp Biliary & Pancreatic Ducts  Result Date: 03/11/2016 CLINICAL DATA:  82 year old female with a history of biliary ductal stones. Stent placement EXAM: ERCP TECHNIQUE: Multiple spot images obtained with the fluoroscopic device and submitted for interpretation post-procedure. FLUOROSCOPY TIME:  20 minutes 43 seconds COMPARISON:  CT 03/10/2016 FINDINGS: Limited fluoroscopic images during ERCP. Initial image demonstrates endoscope in the upper abdomen with cannulation of the ampulla and retrograde infusion of contrast partially opacifying the extrahepatic biliary system. Subsequent images demonstrate placement of plastic biliary stent. Surgical clips compatible with prior cholecystectomy. IMPRESSION: Limited images during ERCP and plastic biliary stent placement. Please refer to the  dictated operative report for full details of intraoperative findings and procedure. Signed, Yvone Neu. Loreta Ave, DO [facility] Radiology Electronically Signed   By: Gilmer Mor D.O.   On: 03/11/2016 14:41     CBC  Recent Labs Lab 03/10/16 1355 03/11/16 0438 03/12/16 0436 03/14/16 0422  WBC 12.2* 5.3 7.3 8.0  HGB 11.0* 9.0* 8.5* 8.5*  HCT 31.3* 27.0* 24.3* 25.9*  PLT 212 171 145* 179  MCV 103.6* 101.5* 101.3* 104.9*  MCH 36.3* 33.8 35.4* 34.4*  MCHC 35.1 33.3 35.0 32.8  RDW 14.0 13.7 13.4 13.9  LYMPHSABS  --   --  0.5*  --   MONOABS  --   --  0.6  --   EOSABS  --   --  0.0  --   BASOSABS  --   --  0.0  --     Chemistries   Recent Labs Lab 03/10/16 1355 03/11/16 0438 03/12/16 0436 03/13/16 0337 03/14/16 0422  NA 134* 136 136 139 136  K 3.8 3.2* 3.7 3.3* 4.1  CL 97* 102 104 107 104  CO2 26 27 21* 26 26  GLUCOSE 104* 84 129* 109* 102*  BUN 18 11 13 9 8   CREATININE 0.96 0.79 1.00 0.81 0.78  CALCIUM 9.9 9.1 8.4* 8.0* 8.0*  AST 284* 137* 71* 47*  --   ALT 280* 179* 122* 93*  --   ALKPHOS 451* 368* 280* 240*  --   BILITOT 5.0* 3.6* 1.5* 1.2  --    ------------------------------------------------------------------------------------------------------------------ estimated creatinine clearance is 26.2 mL/min (by C-G formula based on SCr of 0.78 mg/dL). ------------------------------------------------------------------------------------------------------------------ No results for input(s): HGBA1C in the last 72 hours. ------------------------------------------------------------------------------------------------------------------ No results for input(s): CHOL, HDL, LDLCALC, TRIG, CHOLHDL, LDLDIRECT in the last 72 hours. ------------------------------------------------------------------------------------------------------------------ No results for input(s): TSH, T4TOTAL, T3FREE, THYROIDAB in the last 72 hours.  Invalid  input(s): FREET3 ------------------------------------------------------------------------------------------------------------------ No results for input(s): VITAMINB12, FOLATE, FERRITIN, TIBC, IRON, RETICCTPCT in the last 72 hours.  Coagulation profile No results for input(s): INR, PROTIME in the last 168 hours.  No results for input(s): DDIMER in the last 72 hours.  Cardiac Enzymes  Recent Labs Lab 03/10/16 1355  TROPONINI <0.03   ------------------------------------------------------------------------------------------------------------------ Invalid input(s): POCBNP   CBG: No results for input(s): GLUCAP in the last 168 hours.     Studies: Dg Chest 1 View  Result Date: 03/14/2016 CLINICAL DATA:  Dry cough for 2 days.  Fever today. EXAM: CHEST 1 VIEW COMPARISON:  PA and lateral chest 09/24/2008. FINDINGS: Patchy right perihilar airspace disease identified. There is also hazy opacity in the left mid and lower lung zones. Small bilateral pleural effusions are seen. No pneumothorax. Heart size is normal. Aortic atherosclerosis noted. Convex left scoliosis is identified. IMPRESSION: Patchy right perihilar and left mid and lower lung zone airspace disease  is worrisome for pneumonia given history. Atherosclerosis. Electronically Signed   By: Drusilla Kannerhomas  Dalessio M.D.   On: 03/14/2016 13:56      No results found for: HGBA1C Lab Results  Component Value Date   CREATININE 0.78 03/14/2016       Scheduled Meds: . enoxaparin (LOVENOX) injection  30 mg Subcutaneous Q24H  . metoprolol  50 mg Oral BID  . piperacillin-tazobactam (ZOSYN)  IV  3.375 g Intravenous Q8H  . vancomycin  500 mg Intravenous Q24H   Continuous Infusions:   LOS: 5 days    Time spent: >30 MINS    West Covina Medical CenterBROL,Shelvy Perazzo  [facility] Pager [phone]. If 7PM-7AM, please contact night-coverage at [email or web address], password [email or web address] 03/15/2016, 7:56 AM  LOS: 5 days

## 2016-03-16 DIAGNOSIS — K8030 Calculus of bile duct with cholangitis, unspecified, without obstruction: Secondary | ICD-10-CM

## 2016-03-16 LAB — CBC
HEMATOCRIT: 24.1 % — AB (ref 36.0–46.0)
Hemoglobin: 8.1 g/dL — ABNORMAL LOW (ref 12.0–15.0)
MCH: 34.6 pg — AB (ref 26.0–34.0)
MCHC: 33.6 g/dL (ref 30.0–36.0)
MCV: 103 fL — AB (ref 78.0–100.0)
PLATELETS: 190 10*3/uL (ref 150–400)
RBC: 2.34 MIL/uL — AB (ref 3.87–5.11)
RDW: 13.3 % (ref 11.5–15.5)
WBC: 7 10*3/uL (ref 4.0–10.5)

## 2016-03-16 LAB — COMPREHENSIVE METABOLIC PANEL
ALK PHOS: 151 U/L — AB (ref 38–126)
ALT: 39 U/L (ref 14–54)
AST: 21 U/L (ref 15–41)
Albumin: 2.6 g/dL — ABNORMAL LOW (ref 3.5–5.0)
Anion gap: 7 (ref 5–15)
BILIRUBIN TOTAL: 0.8 mg/dL (ref 0.3–1.2)
BUN: 13 mg/dL (ref 6–20)
CALCIUM: 8.6 mg/dL — AB (ref 8.9–10.3)
CHLORIDE: 101 mmol/L (ref 101–111)
CO2: 29 mmol/L (ref 22–32)
CREATININE: 1.35 mg/dL — AB (ref 0.44–1.00)
GFR, EST AFRICAN AMERICAN: 38 mL/min — AB (ref >60)
GFR, EST NON AFRICAN AMERICAN: 32 mL/min — AB (ref >60)
Glucose, Bld: 95 mg/dL (ref 65–99)
Potassium: 4.2 mmol/L (ref 3.5–5.1)
Sodium: 137 mmol/L (ref 135–145)
TOTAL PROTEIN: 5.1 g/dL — AB (ref 6.5–8.1)

## 2016-03-16 MED ORDER — LEVOFLOXACIN 500 MG PO TABS: 500.0000 mg | ORAL_TABLET | Freq: Every day | ORAL | 0 refills | Status: DC

## 2016-03-16 MED ORDER — VANCOMYCIN HCL 500 MG IV SOLR: 500.0000 mg | INTRAVENOUS | Status: DC

## 2016-03-16 MED ORDER — PIPERACILLIN-TAZOBACTAM IN DEX 2-0.25 GM/50ML IV SOLN
2.2500 g | Freq: Three times a day (TID) | INTRAVENOUS | Status: DC
  Administered 2016-03-16: 2.25 g via INTRAVENOUS
  Filled 2016-03-16 (×3): qty 50

## 2016-03-16 NOTE — Progress Notes (Addendum)
Left messages for Social work re; discharge to SNF. 1545 Report given to [place] at [facility]. Discharged pt to SNF via [facility]. Pt's son was here and is aware of the discharge.

## 2016-03-16 NOTE — Discharge Summary (Signed)
Physician Discharge Summary  Susan Blackwell HFW:[id number] DOB: Jul 10, 1919 DOA: 03/10/2016  PCP: [name], MD  Admit date: 03/10/2016 Discharge date: 03/13/2016  Recommendations for Outpatient Follow-up:  1. Pt will need to follow up with PCP in 2-3 weeks post discharge 2. Please obtain BMP to evaluate electrolytes and kidney function 3. Must hold aspirin for three days, no NSAID's 4. Physician at SNF to decide if pt ok to take narcotic medication if pt has regular bowel regimen  5. Follow up with Dr Ewing Schlein , in 1-2 weeks       Discharge Diagnoses:  Active Problems:   HTN (hypertension)   Common bile duct dilatation   Bile duct stone   Fever  Discharge Condition: Stable  Diet recommendation: Heart healthy diet discussed in details   Brief Narrative:82 y.o.femalewith medical history significant of HTN and osteoarthritis.. She's had a several week history of not feeling well. over the last few days she's had light almost white stools.She was recently seen by her PCP to check lab work and found that her liver enzymes were elevated. She was to have a liver ultrasound on Monday. She will unfortunately was unable to do this as she had worsening pain and vomiting. In the ER [place] she had a CT scan of her abdomen. This showed choledocholithiasis with increasing common bile duct and intrahepatic biliary dilatation. Common bile duct was previously 8 mm and is now 17 mm. GI coverage at  was contacted but they unfortunately did not do ERCPs. Patient was [facility] evaluation and possible ERCP   Hospital course Healthcare associated pneumonia Symptoms developed 11/22 Chest x-ray consistent with patchy right perihilar and left mid and lower lung zone airspace disease Patient started on vancomycin and Zosyn for 48 hrs , now switched to levaquin Blood culture NGSF  Continue to attempt to wean oxygen to room air  Acute abd pain, epigastric secondary to  CBD stone - pt is s/p ERCP with sphincterotomy and biliary stone extractions 11/19 by Dr Evette Cristal . - please note that not all of the stones from the CBD were able to be removed and pt now has a biliary stent in place - no complaints of abdominal pain - diet advanced to soft and pt tolerating well   Hypokalemia - supplemented prior to discharge   Leukocytosis - suspect secondary to the abd pain and CBD stone - resolved   Transaminitis with hyperbilirubinemia  - due to the CBD stone - LFT's trending down  Drop in Hg - no signs of active bleed, suspect some dilutional component from IVF  Fever 11/22 cxr ,ua done prior to DC , to r/o PNA/UTI WBC normal     DVT prophylaxis: Lovenox SQ Code Status: Full  Family Communication: Patient at bedside  Disposition Plan: SNF   Consultants:   [facility]   Procedures:   ERCP  Antimicrobials:   None  Procedures/Studies: Ct Abdomen Pelvis W Contrast Result Date: 03/10/2016 Choledocholithiasis with increasing CBD and intrahepatic biliary dilatation. CBD now measures 17 mm, previously 8 mm.   Dg Ercp Biliary & Pancreatic Ducts Result Date: 03/11/2016 Limited images during ERCP and plastic biliary stent placement.   Discharge Exam: Vitals:   03/15/16 2213 03/16/16 0615  BP: (!) 123/53 (!) 136/53  Pulse: 77 68  Resp: 17 17  Temp: 98.6 F (37 C) 97.9 F (36.6 C)   Vitals:   03/15/16 0442 03/15/16 1339 03/15/16 2213 03/16/16 0615  BP: (!) 127/40 (!) 137/45 (!) 123/53 Marland Kitchen)  136/53  Pulse: 65 76 77 68  Resp: 16 16 17 17   Temp: 97.9 F (36.6 C) 98.7 F (37.1 C) 98.6 F (37 C) 97.9 F (36.6 C)  TempSrc: Oral Oral Oral Oral  SpO2: 100% 94% 94% 95%  Weight:      Height:        General: Pt is alert, follows commands appropriately, not in acute distress Cardiovascular: Regular rate and rhythm, no rubs, no gallops Respiratory: Clear to auscultation bilaterally, no wheezing, no crackles, no rhonch iAbdominal: Soft,  non tender, non distended, bowel sounds +, no guarding  Discharge Instructions  Discharge Instructions    Diet - low sodium heart healthy    Complete by:  As directed    Diet - low sodium heart healthy    Complete by:  As directed    Diet - low sodium heart healthy    Complete by:  As directed    Diet - low sodium heart healthy    Complete by:  As directed    Increase activity slowly    Complete by:  As directed    Increase activity slowly    Complete by:  As directed    Increase activity slowly    Complete by:  As directed    Increase activity slowly    Complete by:  As directed        Medication List    STOP taking these medications   aspirin EC 81 MG tablet   hydrochlorothiazide 25 MG tablet Commonly known as:  HYDRODIURIL     TAKE these medications   ARNICARE Gel Apply 1 application topically.   CALTRATE 600+D PLUS MINERALS 600-800 MG-UNIT Tabs Take 1 tablet by mouth daily.   Cholecalciferol 2000 units Tabs Take 1 tablet by mouth daily.   dimenhyDRINATE 50 MG tablet Commonly known as:  DRAMAMINE Take 50 mg by mouth every 8 (eight) hours as needed.   HYDROcodone-acetaminophen 5-325 MG tablet Commonly known as:  NORCO/VICODIN Take 1 tablet by mouth every 4 (four) hours as needed. 1 tablet at 1900, 0.5 tablet at 0000   levofloxacin 500 MG tablet Commonly known as:  LEVAQUIN Take 1 tablet (500 mg total) by mouth daily.   LORazepam 0.5 MG tablet Commonly known as:  ATIVAN Take 1 tablet (0.5 mg total) by mouth at bedtime as needed for anxiety.   metoprolol 50 MG tablet Commonly known as:  LOPRESSOR Take 50 mg by mouth 2 (two) times daily.   potassium chloride 10 MEQ tablet Commonly known as:  K-DUR Take 1 tablet by mouth daily.   THERA-GESIC PLUS Crea Apply 1 application topically at bedtime.   valsartan 320 MG tablet Commonly known as:  DIOVAN Take 0.5 tablets (160 mg total) by mouth daily. What changed:  how much to take   vitamin B-12 1000  MCG tablet Commonly known as:  CYANOCOBALAMIN Take 1,000 mcg by mouth daily.         Contact information for follow-up providers    DAVID , MD Follow up.   Specialty:  Family Medicine Contact information: [address] [place] [phone]        [facility] E, MD. Schedule an appointment as soon as possible for a visit.   Specialty:  Gastroenterology Contact information: [address] [place] [phone]            Contact information for after-discharge care    Destination    [facility] Follow  up.   Specialty:  Skilled Nursing Facility Contact information: [address] [place] [address] [phone]                   The results of significant diagnostics from this hospitalization (including imaging, microbiology, ancillary and laboratory) are listed below for reference.     Microbiology: Recent Results (from the past 240 hour(s))  Culture, blood (Routine X 2) w Reflex to ID Panel     Status: None (Preliminary result)   Collection Time: 03/14/16  3:45 PM  Result Value Ref Range Status   Specimen Description BLOOD RIGHT HAND  Final   Special Requests BOTTLES DRAWN AEROBIC AND ANAEROBIC 7CC  Final   Culture NO GROWTH < 24 HOURS  Final   Report Status PENDING  Incomplete  Culture, blood (Routine X 2) w Reflex to ID Panel     Status: None (Preliminary result)   Collection Time: 03/14/16  3:50 PM  Result Value Ref Range Status   Specimen Description BLOOD RIGHT ARM  Final   Special Requests IN PEDIATRIC BOTTLE 2CC  Final   Culture NO GROWTH < 24 HOURS  Final   Report Status PENDING  Incomplete     Labs: Basic Metabolic Panel:  Recent Labs Lab 03/11/16 0438 03/12/16 0436 03/13/16 0337 03/14/16 0422 03/16/16 0530  NA 136 136 139 136 137  K 3.2* 3.7 3.3* 4.1 4.2  CL 102 104 107 104 101  CO2 27 21* 26 26 29   GLUCOSE 84 129* 109* 102* 95  BUN 11 13 9 8 13    CREATININE 0.79 1.00 0.81 0.78 1.35*  CALCIUM 9.1 8.4* 8.0* 8.0* 8.6*   Liver Function Tests:  Recent Labs Lab 03/10/16 1355 03/11/16 0438 03/12/16 0436 03/13/16 0337 03/16/16 0530  AST 284* 137* 71* 47* 21  ALT 280* 179* 122* 93* 39  ALKPHOS 451* 368* 280* 240* 151*  BILITOT 5.0* 3.6* 1.5* 1.2 0.8  PROT 7.1 5.5* 5.2* 4.9* 5.1*  ALBUMIN 4.0 2.9* 2.9* 2.8* 2.6*    Recent Labs Lab 03/10/16 1355  LIPASE 25   No results for input(s): AMMONIA in the last 168 hours. CBC:  Recent Labs Lab 03/10/16 1355 03/11/16 0438 03/12/16 0436 03/14/16 0422 03/16/16 0530  WBC 12.2* 5.3 7.3 8.0 7.0  NEUTROABS  --   --  6.3  --   --   HGB 11.0* 9.0* 8.5* 8.5* 8.1*  HCT 31.3* 27.0* 24.3* 25.9* 24.1*  MCV 103.6* 101.5* 101.3* 104.9* 103.0*  PLT 212 171 145* 179 190   Cardiac Enzymes:  Recent Labs Lab 03/10/16 1355  TROPONINI <0.03   BNP: BNP (last 3 results) No results for input(s): BNP in the last 8760 hours.  ProBNP (last 3 results) No results for input(s): PROBNP in the last 8760 hours.  CBG: No results for input(s): GLUCAP in the last 168 hours.   SIGNED: Time coordinating discharge: Over 30 minutes  03/18/16, MD  [facility] 03/16/2016, 7:28 AM Pager [phone]  If 7PM-7AM, please contact night-coverage [email or web address] Password [email or web address]

## 2016-03-16 NOTE — Progress Notes (Signed)
Physical Therapy Treatment Patient Details Name: Susan Blackwell MRN: [id number] DOB: [birth year] Today's Date: 03/16/2016    History of Present Illness Pt is a 82 y/o female who presented to the ED with white stools and abdominal pain which had been going on for several weeks before admission. CT showed choledocholithiasis with increasing common bile duct and intrahepatic biliary dilatation. Pt was previously independent and still drove to the grocery store. Pt has significant PMH of HTN and cholecystectomy     PT Comments    Pt is initially hesitant, but willing work with therapy. Performed gait training with RW with mild dyspnea noted following requiring pt to rest at EOB before laying supine. Pt continues to benefit from SNF placement in order to improve strength and endurance before returning home alone. Pt had a friend present during this visit who assist weekly.   Follow Up Recommendations  SNF     Equipment Recommendations  Rolling walker with 5" wheels    Recommendations for Other Services       Precautions / Restrictions Precautions Precautions: None Restrictions Weight Bearing Restrictions: No    Mobility  Bed Mobility Overal bed mobility: Independent                Transfers Overall transfer level: Needs assistance Equipment used: Rolling walker (2 wheeled) Transfers: Sit to/from Stand Sit to Stand: Supervision         General transfer comment: supervison for safety from EOB  Ambulation/Gait Ambulation/Gait assistance: Min guard Ambulation Distance (Feet): 75 Feet Assistive device: Rolling walker (2 wheeled) Gait Pattern/deviations: Step-through pattern Gait velocity: decreased Gait velocity interpretation: Below normal speed for age/gender General Gait Details: min guard for safety, pt becomes fatigued following gait   Stairs            Wheelchair Mobility    Modified Rankin (Stroke Patients Only)       Balance                                     Cognition Arousal/Alertness: Awake/alert Behavior During Therapy: WFL for tasks assessed/performed Overall Cognitive Status: Within Functional Limits for tasks assessed                      Exercises      General Comments        Pertinent Vitals/Pain Pain Assessment: No/denies pain    Home Living                      Prior Function            PT Goals (current goals can now be found in the care plan section) Acute Rehab PT Goals Patient Stated Goal: to regain strength Progress towards PT goals: Progressing toward goals    Frequency    Min 3X/week      PT Plan Current plan remains appropriate    Co-evaluation             End of Session Equipment Utilized During Treatment: Gait belt Activity Tolerance: Patient tolerated treatment well Patient left: in bed;with call bell/phone within reach;with bed alarm set;with family/visitor present     Time: 3545-6256 PT Time Calculation (min) (ACUTE ONLY): 11 min  Charges:  $Gait Training: 8-22 mins                    G Codes:  Susan Blackwell PT, DPT  [phone]  03/16/2016, 3:19 PM

## 2016-03-16 NOTE — Progress Notes (Signed)
CSW notified by nurse pt ready to DC, CSW contacted [facility] to advise, ok for pt to come today. CSW contacted [facility] to set up transportation, previous CSW notes indicate all forms complete, CSW updated nurse, provided nurse to nurse info. Nurse verified forms are on pt's chart for [facility]. CSW contacted son in pt's room to advise. Pt has no other needs at this time.  CSW is signing off.  Alejos Reinhardt B. Gean Quint Clinical Social Work Dept Weekend Social Worker [phone] 3:31 PM

## 2016-03-16 NOTE — Progress Notes (Signed)
Pharmacy Antibiotic Note  Susan Blackwell is a 82 y.o. female admitted on 03/10/2016 with white stools and abdominal pain. Now with fever and possible HCAP. Pharmacy has been consulted for Zosyn and vancomycin dosing.  Day #3 of abx for HCAP. CXR worrisome for PNA. Afebrile yesterday, WBC wnl. SCr up to 1.35, CrCl ~6ml/min  Plan: Change Zosyn to 2.25g IV Q8 Change vancomycin to 500mg  IV Q48 Monitor clinical picture, renal function, VT prn F/U C&S, abx deescalation / LOT  Consider de-escalating abx soon?  Height: 4\' 9"  (144.8 cm) Weight: 99 lb 8 oz (45.1 kg) IBW/kg (Calculated) : 38.6  Temp (24hrs), Avg:98.4 F (36.9 C), Min:97.9 F (36.6 C), Max:98.7 F (37.1 C)   Recent Labs Lab 03/10/16 1355 03/11/16 0438 03/12/16 0436 03/13/16 0337 03/14/16 0422 03/16/16 0530  WBC 12.2* 5.3 7.3  --  8.0 7.0  CREATININE 0.96 0.79 1.00 0.81 0.78 1.35*    Estimated Creatinine Clearance: 15.5 mL/min (by C-G formula based on SCr of 1.35 mg/dL (H)).    Allergies  Allergen Reactions  . Tramadol Nausea Only    Antimicrobials this admission: Zosyn 11/22 >>  Vancomycin 11/22 >>   Dose adjustments this admission: n/a  Microbiology results: 11/22 BCx: sent  Thank you for allowing pharmacy to be a part of this patient's care.  12/22 03/16/2016 9:42 AM

## 2016-03-17 DIAGNOSIS — D649 Anemia, unspecified: Secondary | ICD-10-CM | POA: Diagnosis not present

## 2016-03-17 LAB — BASIC METABOLIC PANEL
Anion gap: 7 (ref 5–15)
BUN: 16 mg/dL (ref 6–20)
CHLORIDE: 104 mmol/L (ref 101–111)
CO2: 28 mmol/L (ref 22–32)
Calcium: 8.7 mg/dL — ABNORMAL LOW (ref 8.9–10.3)
Creatinine, Ser: 1.1 mg/dL — ABNORMAL HIGH (ref 0.44–1.00)
GFR calc non Af Amer: 42 mL/min — ABNORMAL LOW (ref >60)
GFR, EST AFRICAN AMERICAN: 48 mL/min — AB (ref >60)
Glucose, Bld: 91 mg/dL (ref 65–99)
POTASSIUM: 3.5 mmol/L (ref 3.5–5.1)
SODIUM: 139 mmol/L (ref 135–145)

## 2016-03-19 LAB — CULTURE, BLOOD (ROUTINE X 2)
Culture: NO GROWTH
Culture: NO GROWTH

## 2016-03-20 DIAGNOSIS — D649 Anemia, unspecified: Secondary | ICD-10-CM | POA: Diagnosis not present

## 2016-03-20 LAB — COMPREHENSIVE METABOLIC PANEL
ALBUMIN: 3.7 g/dL (ref 3.5–5.0)
ALT: 27 U/L (ref 14–54)
AST: 26 U/L (ref 15–41)
Alkaline Phosphatase: 126 U/L (ref 38–126)
Anion gap: 7 (ref 5–15)
BUN: 13 mg/dL (ref 6–20)
CHLORIDE: 104 mmol/L (ref 101–111)
CO2: 27 mmol/L (ref 22–32)
CREATININE: 0.96 mg/dL (ref 0.44–1.00)
Calcium: 10.3 mg/dL (ref 8.9–10.3)
GFR calc Af Amer: 57 mL/min — ABNORMAL LOW (ref >60)
GFR, EST NON AFRICAN AMERICAN: 49 mL/min — AB (ref >60)
GLUCOSE: 92 mg/dL (ref 65–99)
POTASSIUM: 4.1 mmol/L (ref 3.5–5.1)
SODIUM: 138 mmol/L (ref 135–145)
Total Bilirubin: 1.2 mg/dL (ref 0.3–1.2)
Total Protein: 6.5 g/dL (ref 6.5–8.1)

## 2016-03-20 LAB — CBC WITH DIFFERENTIAL/PLATELET
BASOS ABS: 0.1 10*3/uL (ref 0–0.1)
Basophils Relative: 1 %
EOS ABS: 0.4 10*3/uL (ref 0–0.7)
Eosinophils Relative: 4 %
HEMATOCRIT: 24.8 % — AB (ref 35.0–47.0)
HEMOGLOBIN: 8.6 g/dL — AB (ref 12.0–16.0)
LYMPHS PCT: 15 %
Lymphs Abs: 1.5 10*3/uL (ref 1.0–3.6)
MCH: 36.4 pg — ABNORMAL HIGH (ref 26.0–34.0)
MCHC: 34.6 g/dL (ref 32.0–36.0)
MCV: 105.2 fL — AB (ref 80.0–100.0)
MONOS PCT: 10 %
Monocytes Absolute: 1 10*3/uL — ABNORMAL HIGH (ref 0.2–0.9)
NEUTROS ABS: 6.9 10*3/uL — AB (ref 1.4–6.5)
NEUTROS PCT: 70 %
Platelets: 276 10*3/uL (ref 150–440)
RBC: 2.36 MIL/uL — AB (ref 3.80–5.20)
RDW: 13.9 % (ref 11.5–14.5)
WBC: 9.9 10*3/uL (ref 3.6–11.0)

## 2016-03-21 ENCOUNTER — Non-Acute Institutional Stay (SKILLED_NURSING_FACILITY): Payer: Medicare Other | Admitting: Gerontology

## 2016-03-21 DIAGNOSIS — K838 Other specified diseases of biliary tract: Secondary | ICD-10-CM | POA: Diagnosis not present

## 2016-03-21 DIAGNOSIS — D649 Anemia, unspecified: Secondary | ICD-10-CM

## 2016-03-23 ENCOUNTER — Encounter
Admission: RE | Admit: 2016-03-23 | Discharge: 2016-03-23 | Disposition: A | Discharge status: Home / Self Care | Payer: Medicare Other | Source: Ambulatory Visit | Attending: Internal Medicine | Admitting: Internal Medicine

## 2016-03-23 DIAGNOSIS — I10 Essential (primary) hypertension: Secondary | ICD-10-CM | POA: Insufficient documentation

## 2016-03-25 DIAGNOSIS — D649 Anemia, unspecified: Secondary | ICD-10-CM | POA: Insufficient documentation

## 2016-03-25 DIAGNOSIS — D539 Nutritional anemia, unspecified: Secondary | ICD-10-CM | POA: Insufficient documentation

## 2016-03-25 NOTE — Progress Notes (Signed)
Location:      Place of Service:  SNF (31) Provider:  Toni Arthurs, NP-C  DAVID Lovie Macadamia, MD  Patient Care Team: Juluis Pitch, MD as PCP - General (Family Medicine)  Extended Emergency Contact Information Primary Emergency Contact: Levell July Address: [address] [place] Phone: [phone] Relation: Son  Code Status:  full Goals of care: Advanced Directive information Advanced Directives 03/12/2016  Does Patient Have a Medical Advance Directive? No  Would patient like information on creating a medical advance directive? No - patient declined information     Chief Complaint  Patient presents with  . Follow-up    HPI:  Pt is a 82 y.o. female seen today for a hospital f/u s/p admission from [place] for abdominal pain, common bile stone extractions ERCP. While in the hospital she developed HCAP. She was on Vancomycin and Zosyn, but changed to Levaquin to treat the PNA. Pt is at the facility for rehab for deconditioning, weakness. Pt reports she is feeling well. Her pain is well controlled. Pt denies any n/v/d/f/c/cp/sob/ha/adb pain/dizziness.She reports she does have chronic anemia. She is currently refusing iron supplementation d/t GI effects. She is taking in some of the Ensure supplements. She agrees to attempt to drink more and eat a more balanced diet to improve nutritional status. Will consider appetite stimulant if pt looses weight or has worsening appetite. No other complaints. VSS   Past Medical History:  Diagnosis Date  . Bile duct stone 02/2016  . Hypertension   . PONV (postoperative nausea and vomiting)    Past Surgical History:  Procedure Laterality Date  . BREAST BIOPSY    . CHOLECYSTECTOMY    . ERCP N/A 03/11/2016   Procedure: ENDOSCOPIC RETROGRADE CHOLANGIOPANCREATOGRAPHY (ERCP);  Surgeon: Clarene Essex, MD;  Location: [facility] ENDOSCOPY;  Service: Endoscopy;  Laterality: N/A;    Allergies  Allergen Reactions  . Tramadol  Nausea Only      Medication List       Accurate as of 03/21/16 11:59 PM. Always use your most recent med list.          ARNICARE Gel Apply 1 application topically.   CALTRATE 600+D PLUS MINERALS 600-800 MG-UNIT Tabs Take 1 tablet by mouth daily.   Cholecalciferol 2000 units Tabs Take 1 tablet by mouth daily.   dimenhyDRINATE 50 MG tablet Commonly known as:  DRAMAMINE Take 50 mg by mouth every 8 (eight) hours as needed.   HYDROcodone-acetaminophen 5-325 MG tablet Commonly known as:  NORCO/VICODIN Take 1 tablet by mouth every 4 (four) hours as needed. 1 tablet at 1900, 0.5 tablet at 0000   levofloxacin 500 MG tablet Commonly known as:  LEVAQUIN Take 1 tablet (500 mg total) by mouth daily.   LORazepam 0.5 MG tablet Commonly known as:  ATIVAN Take 1 tablet (0.5 mg total) by mouth at bedtime as needed for anxiety.   metoprolol 50 MG tablet Commonly known as:  LOPRESSOR Take 50 mg by mouth 2 (two) times daily.   potassium chloride 10 MEQ tablet Commonly known as:  K-DUR Take 1 tablet by mouth daily.   THERA-GESIC PLUS Crea Apply 1 application topically at bedtime.   valsartan 320 MG tablet Commonly known as:  DIOVAN Take 0.5 tablets (160 mg total) by mouth daily.   vitamin B-12 1000 MCG tablet Commonly known as:  CYANOCOBALAMIN Take 1,000 mcg by mouth daily.       Review of Systems  Constitutional: Negative for  activity change, appetite change, chills, diaphoresis and fever.  HENT: Negative for congestion, sneezing, sore throat, trouble swallowing and voice change.   Eyes: Negative for pain, redness and visual disturbance.  Respiratory: Negative for apnea, cough, choking, chest tightness, shortness of breath and wheezing.   Cardiovascular: Negative for chest pain, palpitations and leg swelling.  Gastrointestinal: Negative.  Negative for abdominal distention, abdominal pain, constipation, diarrhea and nausea.  Genitourinary: Negative.  Negative for  difficulty urinating, dysuria, frequency and urgency.  Musculoskeletal: Positive for arthralgias (typical arthritis). Negative for back pain, gait problem and myalgias.  Skin: Negative.  Negative for color change, pallor, rash and wound.  Neurological: Positive for weakness. Negative for dizziness, tremors, syncope, speech difficulty, numbness and headaches.  Hematological: Negative.   Psychiatric/Behavioral: Negative.   All other systems reviewed and are negative.    There is no immunization history on file for this patient. There are no preventive care reminders to display for this patient. No flowsheet data found. Functional Status Survey:    Vitals:   03/21/16 0545  BP: 140/72  Pulse: 72  Resp: 20  Temp: 98.3 F (36.8 C)  SpO2: 99%   There is no height or weight on file to calculate BMI. Physical Exam  Constitutional: She is oriented to person, place, and time. Vital signs are normal. She appears well-developed and well-nourished. She is active and cooperative. She does not appear ill. No distress.  HENT:  Head: Normocephalic and atraumatic.  Mouth/Throat: Uvula is midline, oropharynx is clear and moist and mucous membranes are normal. Mucous membranes are not pale, not dry and not cyanotic.  Eyes: Conjunctivae, EOM and lids are normal. Pupils are equal, round, and reactive to light.  Neck: Trachea normal, normal range of motion and full passive range of motion without pain. Neck supple. No JVD present. No tracheal deviation, no edema and no erythema present. No thyromegaly present.  Cardiovascular: Normal rate, regular rhythm, normal heart sounds, intact distal pulses and normal pulses.  Exam reveals no gallop, no distant heart sounds and no friction rub.   No murmur heard. Pulmonary/Chest: Effort normal and breath sounds normal. No accessory muscle usage. No respiratory distress. She has no wheezes. She has no rales. She exhibits no tenderness.  Abdominal: Normal appearance  and bowel sounds are normal. She exhibits no distension and no ascites. There is no tenderness.  Musculoskeletal: Normal range of motion. She exhibits no edema or tenderness.  Expected osteoarthritis, stiffness  Neurological: She is alert and oriented to person, place, and time. She has normal strength.  Skin: Skin is warm, dry and intact. No rash noted. She is not diaphoretic. No cyanosis or erythema. There is pallor. Nails show no clubbing.  Psychiatric: She has a normal mood and affect. Her speech is normal and behavior is normal. Judgment and thought content normal. Cognition and memory are normal.  Nursing note and vitals reviewed.   Labs reviewed:  Recent Labs  03/16/16 0530 03/17/16 0500 03/20/16 0628  NA 137 139 138  K 4.2 3.5 4.1  CL 101 104 104  CO2 29 28 27   GLUCOSE 95 91 92  BUN 13 16 13   CREATININE 1.35* 1.10* 0.96  CALCIUM 8.6* 8.7* 10.3    Recent Labs  03/13/16 0337 03/16/16 0530 03/20/16 0628  AST 47* 21 26  ALT 93* 39 27  ALKPHOS 240* 151* 126  BILITOT 1.2 0.8 1.2  PROT 4.9* 5.1* 6.5  ALBUMIN 2.8* 2.6* 3.7    Recent Labs  03/12/16 0436  03/14/16 0422 03/16/16 0530 03/20/16 0628  WBC 7.3 8.0 7.0 9.9  NEUTROABS 6.3  --   --  6.9*  HGB 8.5* 8.5* 8.1* 8.6*  HCT 24.3* 25.9* 24.1* 24.8*  MCV 101.3* 104.9* 103.0* 105.2*  PLT 145* 179 190 276   No results found for: TSH No results found for: HGBA1C No results found for: CHOL, HDL, LDLCALC, LDLDIRECT, TRIG, CHOLHDL  Significant Diagnostic Results in last 30 days:  No results found.  Assessment/Plan 1. Common bile duct dilatation  Low fat diet  Norco 5/325 mg 1 tablet po Q 4 hours prn for pain  2. Anemia, unspecified type  Pt refused iron supplementation  Pt agreeable to attempting to eat a balanced diet with supplementation in the form of Ensure.   Pt refused added meats for iron to diet.     Family/ staff Communication:   Total Time:  Documentation:  Face to  Face:  Family/Phone:   Labs/tests ordered:  Cbc, met c 1 week  Vikki Ports, NP-C [facility] [address], [place] [address] Cell Phone (Mon-Fri 8am-5pm):  [phone] On Call:  [phone] & follow prompts after 5pm & weekends Office Phone:  [phone] Office Fax:  [phone]

## 2016-03-27 LAB — CBC WITH DIFFERENTIAL/PLATELET
BASOS ABS: 0 10*3/uL (ref 0–0.1)
BASOS PCT: 1 %
EOS ABS: 0.3 10*3/uL (ref 0–0.7)
EOS PCT: 6 %
HCT: 24 % — ABNORMAL LOW (ref 35.0–47.0)
Hemoglobin: 8.1 g/dL — ABNORMAL LOW (ref 12.0–16.0)
LYMPHS PCT: 21 %
Lymphs Abs: 1 10*3/uL (ref 1.0–3.6)
MCH: 35.4 pg — ABNORMAL HIGH (ref 26.0–34.0)
MCHC: 33.8 g/dL (ref 32.0–36.0)
MCV: 104.8 fL — ABNORMAL HIGH (ref 80.0–100.0)
Monocytes Absolute: 0.5 10*3/uL (ref 0.2–0.9)
Monocytes Relative: 11 %
Neutro Abs: 2.9 10*3/uL (ref 1.4–6.5)
Neutrophils Relative %: 61 %
PLATELETS: 178 10*3/uL (ref 150–440)
RBC: 2.29 MIL/uL — AB (ref 3.80–5.20)
RDW: 14.4 % (ref 11.5–14.5)
WBC: 4.7 10*3/uL (ref 3.6–11.0)

## 2016-03-27 LAB — COMPREHENSIVE METABOLIC PANEL
ALBUMIN: 3.2 g/dL — AB (ref 3.5–5.0)
ALT: 10 U/L — AB (ref 14–54)
AST: 17 U/L (ref 15–41)
Alkaline Phosphatase: 84 U/L (ref 38–126)
Anion gap: 6 (ref 5–15)
BUN: 19 mg/dL (ref 6–20)
CHLORIDE: 107 mmol/L (ref 101–111)
CO2: 26 mmol/L (ref 22–32)
CREATININE: 1.04 mg/dL — AB (ref 0.44–1.00)
Calcium: 9.4 mg/dL (ref 8.9–10.3)
GFR calc Af Amer: 52 mL/min — ABNORMAL LOW (ref >60)
GFR calc non Af Amer: 45 mL/min — ABNORMAL LOW (ref >60)
Glucose, Bld: 90 mg/dL (ref 65–99)
POTASSIUM: 3.6 mmol/L (ref 3.5–5.1)
SODIUM: 139 mmol/L (ref 135–145)
Total Bilirubin: 0.7 mg/dL (ref 0.3–1.2)
Total Protein: 5.6 g/dL — ABNORMAL LOW (ref 6.5–8.1)

## 2016-03-30 LAB — COMPREHENSIVE METABOLIC PANEL
ALT: 11 U/L — AB (ref 14–54)
ANION GAP: 7 (ref 5–15)
AST: 20 U/L (ref 15–41)
Albumin: 3.7 g/dL (ref 3.5–5.0)
Alkaline Phosphatase: 81 U/L (ref 38–126)
BUN: 21 mg/dL — ABNORMAL HIGH (ref 6–20)
CHLORIDE: 104 mmol/L (ref 101–111)
CO2: 25 mmol/L (ref 22–32)
CREATININE: 1.13 mg/dL — AB (ref 0.44–1.00)
Calcium: 9.6 mg/dL (ref 8.9–10.3)
GFR, EST AFRICAN AMERICAN: 47 mL/min — AB (ref >60)
GFR, EST NON AFRICAN AMERICAN: 40 mL/min — AB (ref >60)
Glucose, Bld: 90 mg/dL (ref 65–99)
Potassium: 4 mmol/L (ref 3.5–5.1)
SODIUM: 136 mmol/L (ref 135–145)
Total Bilirubin: 0.5 mg/dL (ref 0.3–1.2)
Total Protein: 6.4 g/dL — ABNORMAL LOW (ref 6.5–8.1)

## 2016-03-30 LAB — CBC
HCT: 28.3 % — ABNORMAL LOW (ref 35.0–47.0)
Hemoglobin: 9.6 g/dL — ABNORMAL LOW (ref 12.0–16.0)
MCH: 35.5 pg — ABNORMAL HIGH (ref 26.0–34.0)
MCHC: 33.9 g/dL (ref 32.0–36.0)
MCV: 104.7 fL — ABNORMAL HIGH (ref 80.0–100.0)
Platelets: 197 K/uL (ref 150–440)
RBC: 2.7 MIL/uL — ABNORMAL LOW (ref 3.80–5.20)
RDW: 14.8 % — ABNORMAL HIGH (ref 11.5–14.5)
WBC: 5.8 K/uL (ref 3.6–11.0)

## 2016-06-15 ENCOUNTER — Other Ambulatory Visit: Payer: Self-pay | Admitting: Gastroenterology

## 2016-06-18 ENCOUNTER — Ambulatory Visit (HOSPITAL_COMMUNITY): Payer: Medicare Other | Admitting: Anesthesiology

## 2016-06-18 ENCOUNTER — Encounter (HOSPITAL_COMMUNITY): Payer: Self-pay

## 2016-06-18 ENCOUNTER — Encounter (HOSPITAL_COMMUNITY)
Admission: RE | Disposition: A | Discharge status: Home / Self Care | Payer: Self-pay | Source: Ambulatory Visit | Attending: Gastroenterology

## 2016-06-18 ENCOUNTER — Ambulatory Visit (HOSPITAL_COMMUNITY)
Admission: RE | Admit: 2016-06-18 | Discharge: 2016-06-18 | Disposition: A | Discharge status: Home / Self Care | Payer: Medicare Other | Source: Ambulatory Visit | Attending: Gastroenterology | Admitting: Gastroenterology

## 2016-06-18 ENCOUNTER — Ambulatory Visit (HOSPITAL_COMMUNITY): Payer: Medicare Other

## 2016-06-18 DIAGNOSIS — M1990 Unspecified osteoarthritis, unspecified site: Secondary | ICD-10-CM | POA: Diagnosis not present

## 2016-06-18 DIAGNOSIS — Z91048 Other nonmedicinal substance allergy status: Secondary | ICD-10-CM | POA: Insufficient documentation

## 2016-06-18 DIAGNOSIS — K8051 Calculus of bile duct without cholangitis or cholecystitis with obstruction: Secondary | ICD-10-CM | POA: Diagnosis not present

## 2016-06-18 DIAGNOSIS — Z7982 Long term (current) use of aspirin: Secondary | ICD-10-CM | POA: Diagnosis not present

## 2016-06-18 DIAGNOSIS — F419 Anxiety disorder, unspecified: Secondary | ICD-10-CM | POA: Diagnosis not present

## 2016-06-18 DIAGNOSIS — Z9889 Other specified postprocedural states: Secondary | ICD-10-CM | POA: Diagnosis not present

## 2016-06-18 DIAGNOSIS — Z801 Family history of malignant neoplasm of trachea, bronchus and lung: Secondary | ICD-10-CM | POA: Diagnosis not present

## 2016-06-18 DIAGNOSIS — Z9049 Acquired absence of other specified parts of digestive tract: Secondary | ICD-10-CM | POA: Insufficient documentation

## 2016-06-18 DIAGNOSIS — Z79899 Other long term (current) drug therapy: Secondary | ICD-10-CM | POA: Insufficient documentation

## 2016-06-18 DIAGNOSIS — K8050 Calculus of bile duct without cholangitis or cholecystitis without obstruction: Secondary | ICD-10-CM | POA: Diagnosis present

## 2016-06-18 DIAGNOSIS — Z4659 Encounter for fitting and adjustment of other gastrointestinal appliance and device: Secondary | ICD-10-CM | POA: Diagnosis not present

## 2016-06-18 DIAGNOSIS — R945 Abnormal results of liver function studies: Secondary | ICD-10-CM

## 2016-06-18 DIAGNOSIS — M069 Rheumatoid arthritis, unspecified: Secondary | ICD-10-CM | POA: Insufficient documentation

## 2016-06-18 DIAGNOSIS — Z79891 Long term (current) use of opiate analgesic: Secondary | ICD-10-CM | POA: Insufficient documentation

## 2016-06-18 DIAGNOSIS — R7989 Other specified abnormal findings of blood chemistry: Secondary | ICD-10-CM

## 2016-06-18 HISTORY — PX: SPYGLASS CHOLANGIOSCOPY: SHX5441

## 2016-06-18 HISTORY — PX: SPYGLASS LITHOTRIPSY: SHX5537

## 2016-06-18 HISTORY — PX: ERCP: SHX5425

## 2016-06-18 HISTORY — PX: GASTROINTESTINAL STENT REMOVAL: SHX6384

## 2016-06-18 SURGERY — ERCP, WITH INTERVENTION IF INDICATED
Anesthesia: General

## 2016-06-18 MED ORDER — IOPAMIDOL (ISOVUE-300) INJECTION 61%
INTRAVENOUS | Status: DC | PRN
  Administered 2016-06-18: 25 mL via INTRAVENOUS

## 2016-06-18 MED ORDER — PROPOFOL 10 MG/ML IV BOLUS
INTRAVENOUS | Status: DC | PRN
  Administered 2016-06-18: 80 mg via INTRAVENOUS

## 2016-06-18 MED ORDER — DEXAMETHASONE SODIUM PHOSPHATE 10 MG/ML IJ SOLN
INTRAMUSCULAR | Status: AC
  Filled 2016-06-18: qty 1

## 2016-06-18 MED ORDER — FENTANYL CITRATE (PF) 100 MCG/2ML IJ SOLN
INTRAMUSCULAR | Status: AC
  Filled 2016-06-18: qty 2

## 2016-06-18 MED ORDER — DEXTROSE 5 % IV SOLN: 1.0000 g | Freq: Once | INTRAVENOUS | Status: DC

## 2016-06-18 MED ORDER — ONDANSETRON HCL 4 MG/2ML IJ SOLN
INTRAMUSCULAR | Status: AC
  Filled 2016-06-18: qty 2

## 2016-06-18 MED ORDER — FENTANYL CITRATE (PF) 100 MCG/2ML IJ SOLN
INTRAMUSCULAR | Status: DC | PRN
  Administered 2016-06-18: 25 ug via INTRAVENOUS
  Administered 2016-06-18: 12.5 ug via INTRAVENOUS
  Administered 2016-06-18: 25 ug via INTRAVENOUS
  Administered 2016-06-18: 12.5 ug via INTRAVENOUS

## 2016-06-18 MED ORDER — LIDOCAINE 2% (20 MG/ML) 5 ML SYRINGE
INTRAMUSCULAR | Status: AC
  Filled 2016-06-18: qty 5

## 2016-06-18 MED ORDER — SODIUM CHLORIDE 0.9 % IV SOLN: INTRAVENOUS | Status: DC

## 2016-06-18 MED ORDER — LACTATED RINGERS IV SOLN
INTRAVENOUS | Status: DC
  Administered 2016-06-18: 12:00:00 via INTRAVENOUS

## 2016-06-18 MED ORDER — SUCCINYLCHOLINE CHLORIDE 200 MG/10ML IV SOSY
PREFILLED_SYRINGE | INTRAVENOUS | Status: AC
  Filled 2016-06-18: qty 10

## 2016-06-18 MED ORDER — PROMETHAZINE HCL 25 MG/ML IJ SOLN: 6.2500 mg | INTRAMUSCULAR | Status: DC | PRN

## 2016-06-18 MED ORDER — DEXAMETHASONE SODIUM PHOSPHATE 4 MG/ML IJ SOLN
INTRAMUSCULAR | Status: DC | PRN
  Administered 2016-06-18: 10 mg via INTRAVENOUS

## 2016-06-18 MED ORDER — LIDOCAINE 2% (20 MG/ML) 5 ML SYRINGE
INTRAMUSCULAR | Status: DC | PRN
  Administered 2016-06-18: 40 mg via INTRAVENOUS

## 2016-06-18 MED ORDER — SUCCINYLCHOLINE CHLORIDE 200 MG/10ML IV SOSY
PREFILLED_SYRINGE | INTRAVENOUS | Status: DC | PRN
  Administered 2016-06-18: 100 mg via INTRAVENOUS

## 2016-06-18 MED ORDER — EPHEDRINE SULFATE-NACL 50-0.9 MG/10ML-% IV SOSY
PREFILLED_SYRINGE | INTRAVENOUS | Status: DC | PRN
  Administered 2016-06-18 (×3): 5 mg via INTRAVENOUS

## 2016-06-18 MED ORDER — FENTANYL CITRATE (PF) 100 MCG/2ML IJ SOLN: 25.0000 ug | INTRAMUSCULAR | Status: DC | PRN

## 2016-06-18 MED ORDER — CIPROFLOXACIN IN D5W 400 MG/200ML IV SOLN
INTRAVENOUS | Status: DC | PRN
  Administered 2016-06-18: 400 mg via INTRAVENOUS

## 2016-06-18 MED ORDER — CIPROFLOXACIN IN D5W 400 MG/200ML IV SOLN
INTRAVENOUS | Status: AC
  Filled 2016-06-18: qty 200

## 2016-06-18 MED ORDER — INDOMETHACIN 50 MG RE SUPP
RECTAL | Status: AC
  Filled 2016-06-18: qty 2

## 2016-06-18 MED ORDER — ONDANSETRON HCL 4 MG/2ML IJ SOLN
INTRAMUSCULAR | Status: DC | PRN
  Administered 2016-06-18: 4 mg via INTRAVENOUS

## 2016-06-18 MED ORDER — EPHEDRINE 5 MG/ML INJ
INTRAVENOUS | Status: AC
  Filled 2016-06-18: qty 10

## 2016-06-18 MED ORDER — GLUCAGON HCL RDNA (DIAGNOSTIC) 1 MG IJ SOLR
INTRAMUSCULAR | Status: AC
  Filled 2016-06-18: qty 1

## 2016-06-18 NOTE — Transfer of Care (Signed)
Immediate Anesthesia Transfer of Care Note  Patient: Susan Blackwell  Procedure(s) Performed: Procedure(s) with comments: ENDOSCOPIC RETROGRADE CHOLANGIOPANCREATOGRAPHY (ERCP) (N/A) - moved for litho us/LH GASTROINTESTINAL STENT REMOVAL (N/A) SPYGLASS CHOLANGIOSCOPY (N/A) SPYGLASS LITHOTRIPSY (N/A)  Patient Location: PACU  Anesthesia Type:General  Level of Consciousness: Patient easily awoken, sedated, comfortable, cooperative, following commands, responds to stimulation.   Airway & Oxygen Therapy: Patient spontaneously breathing, ventilating well, oxygen via simple oxygen mask.  Post-op Assessment: Report given to PACU RN, vital signs reviewed and stable, moving all extremities.   Post vital signs: Reviewed and stable.  Complications: No apparent anesthesia complications Last Vitals:  Vitals:   06/18/16 1428 06/18/16 1430  BP: (!) 120/56 (!) 120/43  Pulse: 85 83  Resp: 16 17  Temp: 36.8 C     Last Pain:  Vitals:   06/18/16 1428  TempSrc: Oral         Complications: No apparent anesthesia complications

## 2016-06-18 NOTE — Anesthesia Preprocedure Evaluation (Signed)
Anesthesia Evaluation  Patient identified by MRN, date of birth, ID band Patient awake    Reviewed: Allergy & Precautions, NPO status , Patient's Chart, lab work & pertinent test results  History of Anesthesia Complications (+) PONV  Airway Mallampati: II  TM Distance: >3 FB Neck ROM: Full    Dental no notable dental hx.    Pulmonary neg pulmonary ROS,    Pulmonary exam normal breath sounds clear to auscultation       Cardiovascular hypertension, Pt. on home beta blockers and Pt. on medications Normal cardiovascular exam Rhythm:Regular Rate:Normal     Neuro/Psych negative neurological ROS  negative psych ROS   GI/Hepatic negative GI ROS, Neg liver ROS,   Endo/Other  negative endocrine ROS  Renal/GU negative Renal ROS  negative genitourinary   Musculoskeletal negative musculoskeletal ROS (+)   Abdominal   Peds negative pediatric ROS (+)  Hematology negative hematology ROS (+)   Anesthesia Other Findings   Reproductive/Obstetrics negative OB ROS                             Anesthesia Physical Anesthesia Plan  ASA: III  Anesthesia Plan: General   Post-op Pain Management:    Induction: Intravenous  Airway Management Planned: Oral ETT  Additional Equipment:   Intra-op Plan:   Post-operative Plan: Extubation in OR  Informed Consent: I have reviewed the patients History and Physical, chart, labs and discussed the procedure including the risks, benefits and alternatives for the proposed anesthesia with the patient or authorized representative who has indicated his/her understanding and acceptance.   Dental advisory given  Plan Discussed with: CRNA and Surgeon  Anesthesia Plan Comments:         Anesthesia Quick Evaluation

## 2016-06-18 NOTE — Anesthesia Postprocedure Evaluation (Addendum)
Anesthesia Post Note  Patient: Susan Blackwell  Procedure(s) Performed: Procedure(s) (LRB): ENDOSCOPIC RETROGRADE CHOLANGIOPANCREATOGRAPHY (ERCP) (N/A) GASTROINTESTINAL STENT REMOVAL (N/A) SPYGLASS CHOLANGIOSCOPY (N/A) SPYGLASS LITHOTRIPSY (N/A)  Patient location during evaluation: PACU Anesthesia Type: General Level of consciousness: awake and alert Pain management: pain level controlled Vital Signs Assessment: post-procedure vital signs reviewed and stable Respiratory status: spontaneous breathing, nonlabored ventilation, respiratory function stable and patient connected to nasal cannula oxygen Cardiovascular status: blood pressure returned to baseline and stable Postop Assessment: no signs of nausea or vomiting Anesthetic complications: no       Last Vitals:  Vitals:   06/18/16 1440 06/18/16 1445  BP: (!) 100/44   Pulse: 83 86  Resp: (!) 21 18  Temp:      Last Pain:  Vitals:   06/18/16 1428  TempSrc: Oral                 Mavery Milling S

## 2016-06-18 NOTE — Anesthesia Procedure Notes (Signed)
Procedure Name: Intubation Date/Time: 06/18/2016 1:14 PM Performed by: Deliah Boston Pre-anesthesia Checklist: Patient identified, Emergency Drugs available, Suction available and Patient being monitored Patient Re-evaluated:Patient Re-evaluated prior to inductionOxygen Delivery Method: Circle system utilized Preoxygenation: Pre-oxygenation with 100% oxygen Intubation Type: IV induction Ventilation: Mask ventilation without difficulty Laryngoscope Size: Mac and 3 Grade View: Grade I Tube type: Oral Tube size: 7.0 mm Number of attempts: 1 Airway Equipment and Method: Stylet and Oral airway Placement Confirmation: ETT inserted through vocal cords under direct vision,  positive ETCO2 and breath sounds checked- equal and bilateral Secured at: 21 cm Tube secured with: Tape Dental Injury: Teeth and Oropharynx as per pre-operative assessment

## 2016-06-18 NOTE — Discharge Instructions (Signed)
Call if question or problem otherwise clear liquid diet today and if doing well tomorrow may have soft solids and then slowly advance as tolerated and follow-up with me as needed if any GI symptoms continue in the future and hold aspirin for 3 days but may resume on Thursday if doing well

## 2016-06-18 NOTE — Progress Notes (Signed)
Susan Blackwell 12:21 PM  Subjective: Patient has been doing well since we last saw her recently in the office and no new complaints  Objective: Vital signs stable afebrile no acute distress exam please see preassessment evaluation  Assessment: Probable residual CBD stones  Plan: Okay to proceed with repeat ERCP with anesthesia assistance and try to remove stones and stent  [facility]  Pager [phone] After 5PM or if no answer call [phone]

## 2016-06-18 NOTE — Op Note (Signed)
[facility] Patient Name: Susan Blackwell Procedure Date: 06/18/2016 MRN: [id number] Attending MD: Vida Rigger , MD Date of Birth: 1920-03-17 CSN: [id number] Age: 83 Admit Type: Inpatient Procedure:                ERCP Indications:              Suspected bile duct stone(s) Providers:                Vida Rigger, MD, Beryle Beams, Technician, Randon Goldsmith, CRNA Referring MD:              Medicines:                General Anesthesia Complications:            No immediate complications. Estimated Blood Loss:     Estimated blood loss: none. Procedure:                Pre-Anesthesia Assessment:                           - Prior to the procedure, a History and Physical                            was performed, and patient medications and                            allergies were reviewed. The patient's tolerance of                            previous anesthesia was also reviewed. The risks                            and benefits of the procedure and the sedation                            options and risks were discussed with the patient.                            All questions were answered, and informed consent                            was obtained. Prior Anticoagulants: The patient has                            taken aspirin, last dose was 1 day prior to                            procedure. ASA Grade Assessment: II - A patient                            with mild systemic disease. After reviewing the  risks and benefits, the patient was deemed in                            satisfactory condition to undergo the procedure.                           After obtaining informed consent, the scope was                            passed under direct vision. Throughout the                            procedure, the patient's blood pressure, pulse, and                            oxygen saturations were monitored continuously. The                             [id number] ([id number]) scope was introduced through                            the mouth, and used to inject contrast into and                            used to locate the major papilla. The ERCP was                            somewhat difficult due to abnormal anatomy. The                            patient tolerated the procedure well. Scope In: Scope Out: Findings:      The major papilla was on the rim of a diverticulum. One stent was       removed from the common bile duct using a snare. Biliary sphincterotomy       was made with a Hydratome sphincterotome using ERBE electrocautery. we       increased it just a little and There was no post-sphincterotomy       bleeding. The biliary tree was swept with a 15 mm balloon starting at       the upper third of the main bile duct. it was lowered to 12 mm to pull       through the sphincterotomy site.Sludge was swept from the duct. A few       tiny stones were removed. No stones remained. The bile duct was explored       endoscopically using the SpyGlass direct visualization probe. The       SpyGlass probe was advanced to the bifurcation. Visibility with the       probe was excellent. The middle third of the main bile duct contained       few specks of stone debris, the largest of which was 1 mm in diameter.       some of which were suctioned through the scope but some clogged the       channel and we continued to withdrawal and no other significant  abnormality was seen Impression:               - The major papilla was on the rim of a                            diverticulum.                           - Choledocholithiasis was found. Complete removal                            was accomplished by biliary sphincterotomy and                            balloon extraction.                           - One stent was removed from the common bile duct.                           - A biliary sphincterotomy was  performed.                           - The biliary tree was swept. ) direct                            visualization using spyglass revealed a few                            residual flecks of stone debris only Moderate Sedation:      N/A- Per Anesthesia Care Recommendation:           - Clear liquid diet today.                           - Continue present medications.                           - Avoid aspirin and nonsteroidal anti-inflammatory                            medicines for 3 days.                           - Return to GI clinic PRN.                           - Telephone GI clinic if symptomatic PRN. Procedure Code(s):        --- Professional ---                           [phone], Esophagogastroduodenoscopy, flexible,                            transoral; with removal of foreign body(s) Diagnosis Code(s):        --- Professional ---  K80.50, Calculus of bile duct without cholangitis                            or cholecystitis without obstruction                           Z46.59, Encounter for fitting and adjustment of                            other gastrointestinal appliance and device CPT copyright 2016 [employer]. All rights reserved. The codes documented in this report are preliminary and upon coder review may  be revised to meet current compliance requirements. Vida Rigger, MD 06/18/2016 2:20:22 PM This report has been signed electronically. Number of Addenda: 0

## 2016-06-20 ENCOUNTER — Encounter (HOSPITAL_COMMUNITY): Payer: Self-pay | Admitting: Gastroenterology

## 2016-09-24 NOTE — Addendum Note (Signed)
Addendum  created 09/24/16 1137 by Yajayra Feldt, MD   Sign clinical note    

## 2016-10-06 ENCOUNTER — Encounter (HOSPITAL_COMMUNITY): Payer: Self-pay

## 2016-10-06 ENCOUNTER — Emergency Department (HOSPITAL_COMMUNITY): Payer: Medicare Other

## 2016-10-06 ENCOUNTER — Emergency Department (HOSPITAL_COMMUNITY)
Admission: EM | Admit: 2016-10-06 | Discharge: 2016-10-06 | Disposition: A | Discharge status: Home / Self Care | Payer: Medicare Other | Attending: Emergency Medicine | Admitting: Emergency Medicine

## 2016-10-06 DIAGNOSIS — Z79899 Other long term (current) drug therapy: Secondary | ICD-10-CM | POA: Diagnosis not present

## 2016-10-06 DIAGNOSIS — H81392 Other peripheral vertigo, left ear: Secondary | ICD-10-CM | POA: Insufficient documentation

## 2016-10-06 DIAGNOSIS — I10 Essential (primary) hypertension: Secondary | ICD-10-CM | POA: Insufficient documentation

## 2016-10-06 DIAGNOSIS — Z7982 Long term (current) use of aspirin: Secondary | ICD-10-CM | POA: Diagnosis not present

## 2016-10-06 DIAGNOSIS — R55 Syncope and collapse: Secondary | ICD-10-CM | POA: Diagnosis present

## 2016-10-06 LAB — I-STAT TROPONIN, ED: TROPONIN I, POC: 0 ng/mL (ref 0.00–0.08)

## 2016-10-06 LAB — CBC WITH DIFFERENTIAL/PLATELET
Basophils Absolute: 0 10*3/uL (ref 0.0–0.1)
Basophils Relative: 0 %
Eosinophils Absolute: 0.3 10*3/uL (ref 0.0–0.7)
Eosinophils Relative: 5 %
HEMATOCRIT: 28.2 % — AB (ref 36.0–46.0)
HEMOGLOBIN: 9.4 g/dL — AB (ref 12.0–15.0)
LYMPHS ABS: 1 10*3/uL (ref 0.7–4.0)
LYMPHS PCT: 16 %
MCH: 35.2 pg — ABNORMAL HIGH (ref 26.0–34.0)
MCHC: 33.3 g/dL (ref 30.0–36.0)
MCV: 105.6 fL — AB (ref 78.0–100.0)
Monocytes Absolute: 0.4 10*3/uL (ref 0.1–1.0)
Monocytes Relative: 6 %
NEUTROS ABS: 4.7 10*3/uL (ref 1.7–7.7)
NEUTROS PCT: 73 %
Platelets: 163 10*3/uL (ref 150–400)
RBC: 2.67 MIL/uL — AB (ref 3.87–5.11)
RDW: 14.3 % (ref 11.5–15.5)
WBC: 6.4 10*3/uL (ref 4.0–10.5)

## 2016-10-06 LAB — COMPREHENSIVE METABOLIC PANEL
ALK PHOS: 73 U/L (ref 38–126)
ALT: 22 U/L (ref 14–54)
AST: 28 U/L (ref 15–41)
Albumin: 3.4 g/dL — ABNORMAL LOW (ref 3.5–5.0)
Anion gap: 8 (ref 5–15)
BILIRUBIN TOTAL: 0.7 mg/dL (ref 0.3–1.2)
BUN: 21 mg/dL — ABNORMAL HIGH (ref 6–20)
CALCIUM: 9 mg/dL (ref 8.9–10.3)
CO2: 27 mmol/L (ref 22–32)
CREATININE: 1.2 mg/dL — AB (ref 0.44–1.00)
Chloride: 102 mmol/L (ref 101–111)
GFR calc Af Amer: 43 mL/min — ABNORMAL LOW (ref >60)
GFR calc non Af Amer: 37 mL/min — ABNORMAL LOW (ref >60)
Glucose, Bld: 121 mg/dL — ABNORMAL HIGH (ref 65–99)
Potassium: 3.6 mmol/L (ref 3.5–5.1)
SODIUM: 137 mmol/L (ref 135–145)
TOTAL PROTEIN: 5.3 g/dL — AB (ref 6.5–8.1)

## 2016-10-06 LAB — LIPASE, BLOOD: Lipase: 30 U/L (ref 11–51)

## 2016-10-06 LAB — URINALYSIS, ROUTINE W REFLEX MICROSCOPIC
Bilirubin Urine: NEGATIVE
GLUCOSE, UA: NEGATIVE mg/dL
HGB URINE DIPSTICK: NEGATIVE
KETONES UR: NEGATIVE mg/dL
LEUKOCYTES UA: NEGATIVE
Nitrite: NEGATIVE
PH: 7 (ref 5.0–8.0)
Protein, ur: NEGATIVE mg/dL
Specific Gravity, Urine: 1.011 (ref 1.005–1.030)

## 2016-10-06 MED ORDER — MECLIZINE HCL 25 MG PO TABS
25.0000 mg | ORAL_TABLET | Freq: Once | ORAL | Status: AC
  Administered 2016-10-06: 25 mg via ORAL
  Filled 2016-10-06: qty 1

## 2016-10-06 MED ORDER — SODIUM CHLORIDE 0.9 % IV BOLUS (SEPSIS)
500.0000 mL | Freq: Once | INTRAVENOUS | Status: AC
  Administered 2016-10-06: 500 mL via INTRAVENOUS

## 2016-10-06 MED ORDER — ONDANSETRON HCL 4 MG/2ML IJ SOLN
4.0000 mg | Freq: Once | INTRAMUSCULAR | Status: AC
  Administered 2016-10-06: 4 mg via INTRAVENOUS
  Filled 2016-10-06: qty 2

## 2016-10-06 MED ORDER — MECLIZINE HCL 25 MG PO TABS: 25.0000 mg | ORAL_TABLET | Freq: Three times a day (TID) | ORAL | 0 refills | Status: DC | PRN

## 2016-10-06 NOTE — ED Notes (Signed)
Patient able to ambulate independently  

## 2016-10-06 NOTE — ED Notes (Signed)
Patient transported to MRI 

## 2016-10-06 NOTE — ED Triage Notes (Signed)
Pt brought in by EMS due to having near syncopal episode while at home. Pt endorse n/v. Per pt has hx of pancreatic stones. Pt a&ox4.

## 2016-10-06 NOTE — ED Provider Notes (Signed)
[facility]-EMERGENCY DEPT Provider Note   CSN: [id number] Arrival date & time: 10/06/16  1248     History   Chief Complaint Chief Complaint  Patient presents with  . Emesis  . Near Syncope    HPI Susan Blackwell is a 83 y.o. female.  HPI Patient presents with acute onset dizziness described as spinning sensation starting at 11:30 today. Associated with nausea and 2 episodes of vomiting. She is having difficulty ambulating. Complain of mild upper abdominal pain but denies chest pain at any point. The previously similar symptoms. Has chronic bilateral ear ringing. Denies any focal weakness or numbness. Normal bowel movement this morning. No blood in the vomit or stool. Past Medical History:  Diagnosis Date  . Bile duct stone 02/2016  . Hypertension   . PONV (postoperative nausea and vomiting)     Patient Active Problem List   Diagnosis Date Noted  . Anemia 03/25/2016  . Fever   . Bile duct stone 03/11/2016  . HTN (hypertension) 03/10/2016  . Common bile duct calculus 03/10/2016  . Common bile duct dilatation 03/10/2016    Past Surgical History:  Procedure Laterality Date  . BREAST BIOPSY    . CHOLECYSTECTOMY    . ERCP N/A 03/11/2016   Procedure: ENDOSCOPIC RETROGRADE CHOLANGIOPANCREATOGRAPHY (ERCP);  Surgeon: Vida Rigger, MD;  Location: [facility] ENDOSCOPY;  Service: Endoscopy;  Laterality: N/A;  . ERCP N/A 06/18/2016   Procedure: ENDOSCOPIC RETROGRADE CHOLANGIOPANCREATOGRAPHY (ERCP);  Surgeon: Vida Rigger, MD;  Location: [facility] ENDOSCOPY;  Service: Endoscopy;  Laterality: N/A;  moved for litho us/LH  . GASTROINTESTINAL STENT REMOVAL N/A 06/18/2016   Procedure: GASTROINTESTINAL STENT REMOVAL;  Surgeon: Vida Rigger, MD;  Location: [facility] ENDOSCOPY;  Service: Endoscopy;  Laterality: N/A;  . SPYGLASS CHOLANGIOSCOPY N/A 06/18/2016   Procedure: JKDTOIZT CHOLANGIOSCOPY;  Surgeon: Vida Rigger, MD;  Location: [facility] ENDOSCOPY;  Service: Endoscopy;  Laterality: N/A;  [place] LITHOTRIPSY N/A 06/18/2016   Procedure: IWPYKDXI LITHOTRIPSY;  Surgeon: Vida Rigger, MD;  Location: [facility] ENDOSCOPY;  Service: Endoscopy;  Laterality: N/A;    OB History    No data available       Home Medications    Prior to Admission medications   Medication Sig Start Date End Date Taking? Authorizing Provider  aspirin EC 81 MG tablet Take 81 mg by mouth daily.    [provider]  Calcium Carbonate-Vit D-Min (CALTRATE 600+D PLUS MINERALS) 600-800 MG-UNIT TABS Take 1 tablet by mouth daily.    [provider]  Cholecalciferol 2000 units TABS Take 2,000 Units by mouth daily.     [provider]  dimenhyDRINATE (DRAMAMINE) 50 MG tablet Take 50 mg by mouth every 8 (eight) hours as needed for dizziness.     [provider]  Homeopathic Products (ARNICARE) GEL Apply 1 application topically daily as needed (pain).    [provider]  hydrochlorothiazide (HYDRODIURIL) 25 MG tablet Take 25 mg by mouth daily. 03/28/16   [provider]  HYDROcodone-acetaminophen (NORCO/VICODIN) 5-325 MG tablet Take 1 tablet by mouth every 4 (four) hours as needed. 1 tablet at 1900, 0.5 tablet at 0000 Patient taking differently: Take 0.5-1 tablets by mouth every 6 (six) hours as needed for moderate pain.  03/14/16   Richarda Overlie, MD  levofloxacin (LEVAQUIN) 500 MG tablet Take 1 tablet (500 mg total) by mouth daily. Patient not taking: Reported on 06/13/2016 03/16/16   Richarda Overlie, MD  [facility] PAIN RELIEF PATCH EX) Apply 1 patch topically daily as needed (pain).    [provider]  LORazepam (ATIVAN) 0.5 MG tablet Take 1 tablet (0.5 mg total) by mouth at bedtime as needed for anxiety. Patient not taking: Reported on 06/13/2016 03/14/16   Richarda Overlie, MD  meclizine (ANTIVERT) 25 MG tablet Take 1 tablet (25 mg total) by mouth 3 (three) times daily as needed for dizziness. 10/06/16   Loren Racer, MD  Menthol-Methyl Salicylate (THERA-GESIC PLUS) CREA Apply 1 application  topically daily as needed (pain).     [provider]  metoprolol (LOPRESSOR) 50 MG tablet Take 50 mg by mouth 2 (two) times daily.    [provider]  potassium chloride (K-DUR) 10 MEQ tablet Take 10 mEq by mouth daily.  01/25/16   [provider]  sodium chloride (OCEAN) 0.65 % SOLN nasal spray Place 1 spray into both nostrils as needed for congestion.    [provider]  Tetrahydrozoline HCl (VISINE OP) Apply 1 drop to eye daily as needed (dry eyes).    [provider]  traZODone (DESYREL) 50 MG tablet Take 50 mg by mouth at bedtime as needed for sleep. 04/05/16   [provider]  valsartan (DIOVAN) 320 MG tablet Take 0.5 tablets (160 mg total) by mouth daily. 03/14/16   Richarda Overlie, MD  vitamin B-12 (CYANOCOBALAMIN) 1000 MCG tablet Take 1,000 mcg by mouth daily.    [provider]    Family History No family history on file.  Social History Social History  Substance Use Topics  . Smoking status: Never Smoker  . Smokeless tobacco: Never Used  . Alcohol use No     Allergies   Tramadol   Review of Systems Review of Systems  Constitutional: Negative for chills and fever.  HENT: Positive for tinnitus. Negative for ear pain and sore throat.   Respiratory: Negative for cough and shortness of breath.   Cardiovascular: Negative for chest pain, palpitations and leg swelling.  Gastrointestinal: Positive for abdominal pain, nausea and vomiting. Negative for blood in stool and diarrhea.  Genitourinary: Negative for dysuria, flank pain and frequency.  Musculoskeletal: Positive for gait problem. Negative for back pain, myalgias and neck pain.  Skin: Negative for rash and wound.  Neurological: Positive for dizziness. Negative for weakness, light-headedness, numbness and headaches.  All other systems reviewed and are negative.    Physical Exam Updated Vital Signs BP (!) 135/54 (BP Location: Right Arm)   Pulse 67   Temp  97.6 F (36.4 C) (Oral)   Resp 16   Ht 4\' 9"  (1.448 m)   Wt 43.1 kg (95 lb)   SpO2 99%   BMI 20.56 kg/m   Physical Exam  Constitutional: She is oriented to person, place, and time. She appears well-developed and well-nourished. No distress.  HENT:  Head: Normocephalic and atraumatic.  Mouth/Throat: Oropharynx is clear and moist. No oropharyngeal exudate.  Eyes: EOM are normal. Pupils are equal, round, and reactive to light.  No appreciable nystagmus  Neck: Normal range of motion. Neck supple.  Cardiovascular: Normal rate and regular rhythm.  Exam reveals no gallop and no friction rub.   No murmur heard. Pulmonary/Chest: Effort normal and breath sounds normal. No respiratory distress. She has no wheezes. She has no rales. She exhibits no tenderness.  Abdominal: Soft. Bowel sounds are normal. There is no tenderness. There is no rebound and no guarding.  Musculoskeletal: Normal range of motion. She exhibits no edema or tenderness.  Neurological: She is alert and oriented to person, place, and time.  Patient is alert and  oriented x3 with clear, goal oriented speech. Patient has 5/5 motor in all extremities. Sensation is intact to light touch. Bilateral finger-to-nose is normal with no signs of dysmetria.   Skin: Skin is warm and dry. No rash noted. No erythema.  Psychiatric: She has a normal mood and affect. Her behavior is normal.  Nursing note and vitals reviewed.    ED Treatments / Results  Labs (all labs ordered are listed, but only abnormal results are displayed) Labs Reviewed  CBC WITH DIFFERENTIAL/PLATELET - Abnormal; Notable for the following:       Result Value   RBC 2.67 (*)    Hemoglobin 9.4 (*)    HCT 28.2 (*)    MCV 105.6 (*)    MCH 35.2 (*)    All other components within normal limits  COMPREHENSIVE METABOLIC PANEL - Abnormal; Notable for the following:    Glucose, Bld 121 (*)    BUN 21 (*)    Creatinine, Ser 1.20 (*)    Total Protein 5.3 (*)    Albumin 3.4  (*)    GFR calc non Af Amer 37 (*)    GFR calc Af Amer 43 (*)    All other components within normal limits  LIPASE, BLOOD  URINALYSIS, ROUTINE W REFLEX MICROSCOPIC  I-STAT TROPOININ, ED    EKG  EKG Interpretation  Date/Time:  Saturday October 06 2016 13:08:44 EDT Ventricular Rate:  66 PR Interval:    QRS Duration: 134 QT Interval:  472 QTC Calculation: 495 R Axis:   13 Text Interpretation:  Sinus rhythm Left bundle branch block Confirmed by Ranae Palms  MD, Sasan Wilkie ([id number]) on 10/06/2016 1:12:38 PM Also confirmed by Ranae Palms  MD, Aurthur Wingerter ([id number]), editor Misty Stanley [phone])  on 10/06/2016 1:24:35 PM       Radiology Mr Brain Wo Contrast  Result Date: 10/06/2016 CLINICAL DATA:  Dizziness, near-syncope EXAM: MRI HEAD WITHOUT CONTRAST TECHNIQUE: Multiplanar, multiecho pulse sequences of the brain and surrounding structures were obtained without intravenous contrast. COMPARISON:  None. FINDINGS: Brain: Moderate atrophy. Negative for hydrocephalus. Negative for acute infarct. Mild chronic white matter changes. Negative for hemorrhage or mass or midline shift. Vascular: Normal arterial flow void Skull and upper cervical spine: Negative Sinuses/Orbits: Mild mucosal edema in the paranasal sinuses. Bilateral lens replacement Other: None IMPRESSION: Atrophy and mild chronic white matter changes due to microvascular ischemia. No acute abnormality. Electronically Signed   By: Marlan Palau M.D.   On: 10/06/2016 15:00    Procedures Procedures (including critical care time)  Medications Ordered in ED Medications  sodium chloride 0.9 % bolus 500 mL (0 mLs Intravenous Stopped 10/06/16 1425)  ondansetron (ZOFRAN) injection 4 mg (4 mg Intravenous Given 10/06/16 1321)  meclizine (ANTIVERT) tablet 25 mg (25 mg Oral Given 10/06/16 1528)   Patient EKG consistent with left bundle branch block. Previous EKG showed that she did have an intraventricular block.   Initial Impression / Assessment and Plan /  ED Course  I have reviewed the triage vital signs and the nursing notes.  Pertinent labs & imaging results that were available during my care of the patient were reviewed by me and considered in my medical decision making (see chart for details).    MRI is normal. Patient states she is feeling much better. Ambulating without any difficulty. Also I'll first that she's been having some left ear fullness and pain in using left ear drops for several days. Examination reveals mildly bulging left TM is without erythema. Start patient on meclizine  and have follow-up with ENT. Return precautions have been given.   Final Clinical Impressions(s) / ED Diagnoses   Final diagnoses:  Peripheral vertigo involving left ear    New Prescriptions New Prescriptions   MECLIZINE (ANTIVERT) 25 MG TABLET    Take 1 tablet (25 mg total) by mouth 3 (three) times daily as needed for dizziness.     Loren Racer, MD 10/06/16 [phone]

## 2016-10-06 NOTE — ED Notes (Signed)
Patient up to restroom.  Gait steady and even.  Able to ambulate independently.

## 2017-09-30 ENCOUNTER — Other Ambulatory Visit: Payer: Self-pay | Admitting: Physician Assistant

## 2017-09-30 ENCOUNTER — Ambulatory Visit
Admission: RE | Admit: 2017-09-30 | Discharge: 2017-09-30 | Disposition: A | Discharge status: Home / Self Care | Payer: Medicare Other | Source: Ambulatory Visit | Attending: Physician Assistant | Admitting: Physician Assistant

## 2017-09-30 DIAGNOSIS — R918 Other nonspecific abnormal finding of lung field: Secondary | ICD-10-CM | POA: Insufficient documentation

## 2017-09-30 DIAGNOSIS — R0789 Other chest pain: Secondary | ICD-10-CM | POA: Diagnosis present

## 2017-09-30 DIAGNOSIS — S2232XA Fracture of one rib, left side, initial encounter for closed fracture: Secondary | ICD-10-CM | POA: Insufficient documentation

## 2017-09-30 DIAGNOSIS — I700 Atherosclerosis of aorta: Secondary | ICD-10-CM | POA: Diagnosis not present

## 2017-09-30 DIAGNOSIS — X58XXXA Exposure to other specified factors, initial encounter: Secondary | ICD-10-CM | POA: Diagnosis not present

## 2018-08-23 ENCOUNTER — Emergency Department
Admission: EM | Admit: 2018-08-23 | Discharge: 2018-08-23 | Disposition: A | Discharge status: Home / Self Care | Payer: Medicare Other | Attending: Emergency Medicine | Admitting: Emergency Medicine

## 2018-08-23 ENCOUNTER — Encounter: Payer: Self-pay | Admitting: Emergency Medicine

## 2018-08-23 ENCOUNTER — Emergency Department: Payer: Medicare Other

## 2018-08-23 ENCOUNTER — Other Ambulatory Visit: Payer: Self-pay

## 2018-08-23 DIAGNOSIS — W19XXXA Unspecified fall, initial encounter: Secondary | ICD-10-CM

## 2018-08-23 DIAGNOSIS — M549 Dorsalgia, unspecified: Secondary | ICD-10-CM | POA: Diagnosis not present

## 2018-08-23 DIAGNOSIS — M542 Cervicalgia: Secondary | ICD-10-CM | POA: Insufficient documentation

## 2018-08-23 DIAGNOSIS — M25511 Pain in right shoulder: Secondary | ICD-10-CM | POA: Diagnosis not present

## 2018-08-23 DIAGNOSIS — M25512 Pain in left shoulder: Secondary | ICD-10-CM | POA: Diagnosis not present

## 2018-08-23 DIAGNOSIS — Z7982 Long term (current) use of aspirin: Secondary | ICD-10-CM | POA: Insufficient documentation

## 2018-08-23 DIAGNOSIS — Z79899 Other long term (current) drug therapy: Secondary | ICD-10-CM | POA: Diagnosis not present

## 2018-08-23 DIAGNOSIS — I10 Essential (primary) hypertension: Secondary | ICD-10-CM | POA: Insufficient documentation

## 2018-08-23 MED ORDER — HYDROCODONE-ACETAMINOPHEN 5-325 MG PO TABS: 1.0000 | ORAL_TABLET | Freq: Four times a day (QID) | ORAL | 0 refills | Status: DC | PRN

## 2018-08-23 MED ORDER — HYDROCODONE-ACETAMINOPHEN 5-325 MG PO TABS
1.0000 | ORAL_TABLET | Freq: Once | ORAL | Status: AC
  Administered 2018-08-23: 1 via ORAL
  Filled 2018-08-23: qty 1

## 2018-08-23 NOTE — ED Provider Notes (Signed)
[facility] Emergency Department Provider Note   ____________________________________________   First MD Initiated Contact with Patient 08/23/18 [phone]     (approximate)  I have reviewed the triage vital signs and the nursing notes.   HISTORY  Chief Complaint Fall and Shoulder Pain    HPI Susan Blackwell is a 85 y.o. female who fell 2 days ago and complains of increasing pain in both shoulders and the upper back.  She says last night was particularly bad.  There is no numbness or weakness or fever or any other complaints.  Patient denies any headache.  She denies ever hitting her head.         Past Medical History:  Diagnosis Date   Bile duct stone 02/2016   Hypertension    PONV (postoperative nausea and vomiting)     Patient Active Problem List   Diagnosis Date Noted   Anemia 03/25/2016   Fever    Bile duct stone 03/11/2016   HTN (hypertension) 03/10/2016   Common bile duct calculus 03/10/2016   Common bile duct dilatation 03/10/2016    Past Surgical History:  Procedure Laterality Date   BREAST BIOPSY     CHOLECYSTECTOMY     ERCP N/A 03/11/2016   Procedure: ENDOSCOPIC RETROGRADE CHOLANGIOPANCREATOGRAPHY (ERCP);  Surgeon: Vida RiggerMarc Magod, MD;  [facility];  Service: Endoscopy;  Laterality: N/A;   ERCP N/A 06/18/2016   Procedure: ENDOSCOPIC RETROGRADE CHOLANGIOPANCREATOGRAPHY (ERCP);  Surgeon: Vida RiggerMarc Magod, MD;  [facility];  Service: Endoscopy;  Laterality: N/A;  moved for litho us/LH   GASTROINTESTINAL STENT REMOVAL N/A 06/18/2016   Procedure: GASTROINTESTINAL STENT REMOVAL;  Surgeon: Vida RiggerMarc Magod, MD;  Location: [facility] ENDOSCOPY;  Service: Endoscopy;  Laterality: N/A;   SPYGLASS CHOLANGIOSCOPY N/A 06/18/2016   Procedure: RUEAVWUJSPYGLASS CHOLANGIOSCOPY;  Surgeon: Vida RiggerMarc Magod, MD;  Location: [facility] ENDOSCOPY;  Service: Endoscopy;  Laterality: N/A;   SPYGLASS LITHOTRIPSY N/A 06/18/2016   Procedure: WJXBJYNWSPYGLASS LITHOTRIPSY;  Surgeon:  Vida RiggerMarc Magod, MD;  Location: [facility] ENDOSCOPY;  Service: Endoscopy;  Laterality: N/A;    Prior to Admission medications   Medication Sig Start Date End Date Taking? Authorizing Provider  aspirin EC 81 MG tablet Take 81 mg by mouth daily.    [provider]  Calcium Carbonate-Vit D-Min (CALTRATE 600+D PLUS MINERALS) 600-800 MG-UNIT TABS Take 1 tablet by mouth daily.    [provider]  Cholecalciferol 2000 units TABS Take 2,000 Units by mouth daily.     [provider]  dimenhyDRINATE (DRAMAMINE) 50 MG tablet Take 50 mg by mouth every 8 (eight) hours as needed for dizziness.     [provider]  Homeopathic Products (ARNICARE) GEL Apply 1 application topically daily as needed (pain).    [provider]  hydrochlorothiazide (HYDRODIURIL) 25 MG tablet Take 25 mg by mouth daily. 03/28/16   [provider]  HYDROcodone-acetaminophen (NORCO/VICODIN) 5-325 MG tablet Take 1 tablet by mouth every 4 (four) hours as needed. 1 tablet at 1900, 0.5 tablet at 0000 Patient taking differently: Take 0.5-1 tablets by mouth every 6 (six) hours as needed for moderate pain.  03/14/16   Richarda OverlieAbrol, Nayana, MD  levofloxacin (LEVAQUIN) 500 MG tablet Take 1 tablet (500 mg total) by mouth daily. Patient not taking: Reported on 06/13/2016 03/16/16   Richarda OverlieAbrol, Nayana, MD  [facility](SALONPAS PAIN RELIEF PATCH EX) Apply 1 patch topically daily as needed (pain).    [provider]  LORazepam (ATIVAN) 0.5 MG tablet Take 1 tablet (0.5 mg total) by mouth at  bedtime as needed for anxiety. Patient not taking: Reported on 06/13/2016 03/14/16   Richarda Overlie, MD  meclizine (ANTIVERT) 25 MG tablet Take 1 tablet (25 mg total) by mouth 3 (three) times daily as needed for dizziness. 10/06/16   Loren Racer, MD  Menthol-Methyl Salicylate (THERA-GESIC PLUS) CREA Apply 1 application topically daily as needed (pain).     [provider]  metoprolol (LOPRESSOR) 50 MG tablet Take 50 mg  by mouth 2 (two) times daily.    [provider]  potassium chloride (K-DUR) 10 MEQ tablet Take 10 mEq by mouth daily.  01/25/16   [provider]  sodium chloride (OCEAN) 0.65 % SOLN nasal spray Place 1 spray into both nostrils as needed for congestion.    [provider]  Tetrahydrozoline HCl (VISINE OP) Apply 1 drop to eye daily as needed (dry eyes).    [provider]  traZODone (DESYREL) 50 MG tablet Take 50 mg by mouth at bedtime as needed for sleep. 04/05/16   [provider]  valsartan (DIOVAN) 320 MG tablet Take 0.5 tablets (160 mg total) by mouth daily. 03/14/16   Richarda Overlie, MD  vitamin B-12 (CYANOCOBALAMIN) 1000 MCG tablet Take 1,000 mcg by mouth daily.    [provider]    Allergies Tramadol  History reviewed. No pertinent family history.  Social History Social History   Tobacco Use   Smoking status: Never Smoker   Smokeless tobacco: Never Used  Substance Use Topics   Alcohol use: No   Drug use: No    Review of Systems  Constitutional: No fever/chills Eyes: No visual changes. ENT: No sore throat. Cardiovascular: Denies chest pain. Respiratory: Denies shortness of breath. Gastrointestinal: No abdominal pain.  No nausea, no vomiting.  No diarrhea.  No constipation. Genitourinary: Negative for dysuria. Musculoskeletal: Negative for back pain. Skin: Negative for rash. Neurological: Negative for headaches, focal weakness    ____________________________________________   PHYSICAL EXAM:  VITAL SIGNS: ED Triage Vitals  Enc Vitals Group     BP 08/23/18 1001 (!) 169/69     Pulse Rate 08/23/18 1001 65     Resp 08/23/18 1001 16     Temp 08/23/18 1001 98.6 F (37 C)     Temp Source 08/23/18 1001 Oral     SpO2 08/23/18 1001 97 %     Weight --      Height --      Head Circumference --      Peak Flow --      Pain Score 08/23/18 0959 0     Pain Loc --      Pain Edu? --      Excl. in GC? --      Constitutional: Alert and oriented. Well appearing and in no acute distress. Eyes: Conjunctivae are normal.  Head: Atraumatic. Nose: No congestion/rhinnorhea. Mouth/Throat: Mucous membranes are moist.  Oropharynx non-erythematous. Neck: No stridor.  Cardiovascular: Normal rate, regular rhythm. Grossly normal heart sounds.  Good peripheral circulation. Respiratory: Normal respiratory effort.  No retractions. Lungs CTAB.  Patient does have pain on palpation of the upper thoracic spine across the top of her shoulders. Gastrointestinal: Soft and nontender. No distention. No abdominal bruits. No CVA tenderness.   Musculoskeletal: No lower extremity tenderness nor edema. Neurologic:  Normal speech and language. No gross focal neurologic deficits are appreciated. No gait instability. Skin:  Skin is warm, dry and intact. No rash noted. Psychiatric: Mood and affect are normal. Speech and behavior are normal.  ____________________________________________  LABS (all labs ordered are listed, but only abnormal results are displayed)  Labs Reviewed - No data to display ____________________________________________  EKG   ____________________________________________  RADIOLOGY  ED MD interpretation  Official radiology report(s): Dg Chest 1 View  Result Date: 08/23/2018 CLINICAL DATA:  Status post fall, back pain, shoulder pain EXAM: CHEST  1 VIEW COMPARISON:  CT chest 09/30/2017 FINDINGS: There is bilateral chronic interstitial lung disease. There is no focal consolidation. There is no pleural effusion or pneumothorax. There is mild stable cardiomegaly. There is thoracic aortic atherosclerosis. There is an S-shaped curvature of the thoracolumbar spine. There is no acute osseous abnormality. IMPRESSION: No active disease. Electronically Signed   By: Elige KoHetal  Patel   On: 08/23/2018 11:30   Dg Thoracic Spine 2 View  Result Date: 08/23/2018 CLINICAL DATA:  Larey SeatFell 2 days ago.  Severe upper back  pain. EXAM: THORACIC SPINE 2 VIEWS COMPARISON:  Chest CT 09/30/2017 FINDINGS: Mild S-shaped scoliosis in the spine. Dextroscoliosis of the thoracic spine with levoscoliosis of the upper lumbar spine. Vertebral body heights are maintained. No evidence for a compression fracture. Atherosclerotic calcifications in the aorta. IMPRESSION: 1. No acute abnormality in the thoracic spine. 2. Scoliosis involving the thoracic and lumbar spine. Electronically Signed   By: Richarda OverlieAdam  Henn M.D.   On: 08/23/2018 11:36   Dg Shoulder Right  Result Date: 08/23/2018 CLINICAL DATA:  Status post fall, bilateral shoulder pain EXAM: RIGHT SHOULDER - 2+ VIEW COMPARISON:  Chest x-ray 03/14/2016 FINDINGS: Generalized osteopenia. No acute fracture or dislocation. Mild osteoarthritis of the glenohumeral joint. Mild arthropathy of the acromioclavicular joint. No aggressive osseous lesion. IMPRESSION: No acute osseous injury of the right shoulder. Electronically Signed   By: Elige KoHetal  Patel   On: 08/23/2018 11:33   Ct Head Wo Contrast  Result Date: 08/23/2018 CLINICAL DATA:  Fall 2 days prior. Base of neck, bilateral shoulder and upper back pain. EXAM: CT HEAD WITHOUT CONTRAST CT CERVICAL SPINE WITHOUT CONTRAST TECHNIQUE: Multidetector CT imaging of the head and cervical spine was performed following the standard protocol without intravenous contrast. Multiplanar CT image reconstructions of the cervical spine were also generated. COMPARISON:  10/06/2016 brain MRI. FINDINGS: CT HEAD FINDINGS Brain: No evidence of parenchymal hemorrhage or extra-axial fluid collection. No mass lesion, mass effect, or midline shift. No CT evidence of acute infarction. Generalized cerebral volume loss. Nonspecific mild subcortical and periventricular white matter hypodensity, most in keeping with chronic small vessel ischemic change. No ventriculomegaly. Vascular: No acute abnormality. Skull: No evidence of calvarial fracture. Sinuses/Orbits: The visualized paranasal  sinuses are essentially clear. Other:  The mastoid air cells are unopacified. CT CERVICAL SPINE FINDINGS Alignment: Straightening of the cervical spine. No facet subluxation. Dens is well positioned between the lateral masses of C1. Minimal 2 mm anterolisthesis at C3-4 and C5-6. Skull base and vertebrae: Nondisplaced fracture of the tip of transverse right T1 process. No cervical spine fracture. No primary bone lesion or focal pathologic process. Soft tissues and spinal canal: No prevertebral edema. No visible canal hematoma. Disc levels: Marked multilevel degenerative disc disease throughout the cervical spine, most prominent at C6-7. Advanced bilateral facet arthropathy. Mild degenerative foraminal stenosis on the right at C4-5. Upper chest: No acute abnormality. Other: Visualized mastoid air cells appear clear. No discrete thyroid nodules. No pathologically enlarged cervical nodes. IMPRESSION: 1. No evidence of acute intracranial abnormality. No evidence of calvarial fracture. 2. Generalized cerebral volume loss and mild chronic small vessel ischemic changes in the cerebral white matter. 3. Nondisplaced  fracture of the tip of the right T1 transverse process. No fracture within the cervical spine. No facet subluxation. 4. Marked multilevel degenerative changes in the cervical spine as detailed. Electronically Signed   By: Delbert Phenix M.D.   On: 08/23/2018 11:06   Ct Cervical Spine Wo Contrast  Result Date: 08/23/2018 CLINICAL DATA:  Fall 2 days prior. Base of neck, bilateral shoulder and upper back pain. EXAM: CT HEAD WITHOUT CONTRAST CT CERVICAL SPINE WITHOUT CONTRAST TECHNIQUE: Multidetector CT imaging of the head and cervical spine was performed following the standard protocol without intravenous contrast. Multiplanar CT image reconstructions of the cervical spine were also generated. COMPARISON:  10/06/2016 brain MRI. FINDINGS: CT HEAD FINDINGS Brain: No evidence of parenchymal hemorrhage or extra-axial  fluid collection. No mass lesion, mass effect, or midline shift. No CT evidence of acute infarction. Generalized cerebral volume loss. Nonspecific mild subcortical and periventricular white matter hypodensity, most in keeping with chronic small vessel ischemic change. No ventriculomegaly. Vascular: No acute abnormality. Skull: No evidence of calvarial fracture. Sinuses/Orbits: The visualized paranasal sinuses are essentially clear. Other:  The mastoid air cells are unopacified. CT CERVICAL SPINE FINDINGS Alignment: Straightening of the cervical spine. No facet subluxation. Dens is well positioned between the lateral masses of C1. Minimal 2 mm anterolisthesis at C3-4 and C5-6. Skull base and vertebrae: Nondisplaced fracture of the tip of transverse right T1 process. No cervical spine fracture. No primary bone lesion or focal pathologic process. Soft tissues and spinal canal: No prevertebral edema. No visible canal hematoma. Disc levels: Marked multilevel degenerative disc disease throughout the cervical spine, most prominent at C6-7. Advanced bilateral facet arthropathy. Mild degenerative foraminal stenosis on the right at C4-5. Upper chest: No acute abnormality. Other: Visualized mastoid air cells appear clear. No discrete thyroid nodules. No pathologically enlarged cervical nodes. IMPRESSION: 1. No evidence of acute intracranial abnormality. No evidence of calvarial fracture. 2. Generalized cerebral volume loss and mild chronic small vessel ischemic changes in the cerebral white matter. 3. Nondisplaced fracture of the tip of the right T1 transverse process. No fracture within the cervical spine. No facet subluxation. 4. Marked multilevel degenerative changes in the cervical spine as detailed. Electronically Signed   By: Delbert Phenix M.D.   On: 08/23/2018 11:06   Ct Thoracic Spine Wo Contrast  Result Date: 08/23/2018 CLINICAL DATA:  Fall 2 days ago with back pain bilateral shoulder pain since a fall. EXAM: CT  THORACIC SPINE WITHOUT CONTRAST TECHNIQUE: Multidetector CT images of the thoracic were obtained using the standard protocol without intravenous contrast. COMPARISON:  None. FINDINGS: Alignment: No traumatic malalignment. Mild degenerative thoracic dextrocurvature. Vertebrae: Negative for vertebral fracture. The posterior left eighth rib is fractured on a chronic basis when compared with 09/30/2017 chest CT. Paraspinal and other soft tissues: No acute finding in the lungs when allowing for motion artifact. There is trace pleural fluid or pleural thickening in the posterior left chest. Atherosclerosis. Disc levels: Diffuse degenerative disc narrowing and endplate spurring IMPRESSION: No acute finding.  Negative for thoracic spine fracture. Electronically Signed   By: Marnee Spring M.D.   On: 08/23/2018 12:23   Dg Shoulder Left  Result Date: 08/23/2018 CLINICAL DATA:  Status post fall, bilateral shoulder pain EXAM: LEFT SHOULDER - 2+ VIEW COMPARISON:  Chest x-ray 03/14/2016 FINDINGS: Generalized osteopenia. No acute fracture or dislocation. No aggressive osseous lesion. Mild osteoarthritis of the glenohumeral joint. Mild arthropathy of the acromioclavicular joint. Chondrocalcinosis of glenohumeral joint as can be seen with CPPD. IMPRESSION: 1.  No acute osseous injury of the left shoulder. 2. Mild osteoarthritis of the left glenohumeral joint. Electronically Signed   By: Elige Ko   On: 08/23/2018 11:32    ____________________________________________   PROCEDURES  Procedure(s) performed (including Critical Care):  Procedures   ____________________________________________   INITIAL IMPRESSION / ASSESSMENT AND PLAN / ED COURSE  CT of the C-spine shows there is a fracture of the tip of T1 CT of the T-spine does not show anything.  X-rays are good.  All the other radiology studies are also negative.  Patient has some hydrocodone that she takes once a day.  I will give her a little bit more she  can take it twice a day if need be.  I warned her about getting woozy from it and not falling and I also warned her about the constipation can get with it.  I will have her follow-up with her doctor return if she is worse or not any better in the next week or so.             ____________________________________________   FINAL CLINICAL IMPRESSION(S) / ED DIAGNOSES  Final diagnoses:  Fall, initial encounter  Acute pain of both shoulders     ED Discharge Orders    None       Note:  This document was prepared using Dragon voice recognition software and may include unintentional dictation errors.    Arnaldo Natal, MD 08/23/18 1249

## 2018-08-23 NOTE — ED Notes (Signed)
Pt ride Susan Blackwell called and she is on her way now.

## 2018-08-23 NOTE — Discharge Instructions (Addendum)
Please take the hydrocodone as directed.  You can take 1 twice a day if need be.  If the pain is very bad at night you can take 1 in the middle of the night about 6 to 8 hours after the first 1.  Be careful the hydrocodone can make you woozy.  Do not fall.  Also the hydrocodone can make you constipated.  Make sure you are drinking plenty of fluids and eating a lot of fiber.

## 2018-08-23 NOTE — ED Notes (Signed)
Pt assisted to use a bedpan. Pt requests something for pain. See new orders.

## 2018-08-23 NOTE — ED Triage Notes (Signed)
Pt to ED via [facility] from home for fall 2 days ago. Pt has been c/o bilateral shoulder pain that has gotten worse since the fall and pt has been unable to sleep due to the pain. Pt denies pain at this time. Pt states that pain was severe last night when she was moving. No LOC with fall, no head, neck, or back pain. Pt is in NAD.

## 2018-09-18 NOTE — Progress Notes (Signed)
Patient's Name: Susan Blackwell  MRN: [id number]  Referring Provider: Marin Olp, PA-C  DOB: 05-28-1919  PCP: Kirk Ruths, MD  DOS: 09/25/2018  Note by: Gillis Santa, MD  Service setting: Ambulatory outpatient  Specialty: Interventional Pain Management  Location: [facility] Pain Management Virtual Visit  Visit type: Initial Patient Evaluation  Patient type: New Patient   Pain Management Virtual Encounter Note - Virtual Visit via Telephone Telehealth (real-time audio visits between healthcare provider and patient).  Patient's Phone No.:  [phone] (home); [phone] (mobile); (Preferred) [phone] No e-mail address on record  [place], [place] [address] Phone: [phone] Fax: [place], [place] [address] Phone: [phone] Fax: [phone]   Pre-screening note:  Our staff contacted Susan Blackwell and offered her an "in person", "face-to-face" appointment versus a telephone encounter. She indicated preferring the telephone encounter, at this time.  Primary Reason(s) for Visit: Tele-Encounter for initial evaluation of one or more chronic problems (new to examiner) potentially causing chronic pain, and posing a threat to normal musculoskeletal function. (Level of risk: High) CC: Back Pain and Shoulder Pain (left)  I contacted Susan Blackwell on 09/25/2018 at 12:54 PM via telephone.      I clearly identified myself as Gillis Santa, MD. I verified that I was speaking with the correct person using two identifiers (Name and date of birth: [birth year]).  Advanced Informed Consent I sought verbal advanced consent from Susan Blackwell for virtual visit interactions. I informed Susan Blackwell of possible security and privacy concerns, risks, and limitations associated with providing "not-in-person" medical evaluation and management  services. I also informed Susan Blackwell of the availability of "in-person" appointments. Finally, I informed her that there would be a charge for the virtual visit and that she could be  personally, fully or partially, financially responsible for it. Susan Blackwell expressed understanding and agreed to proceed.   HPI  Susan Blackwell is a 85 y.o. year old, female patient, contacted today for an initial evaluation of her chronic pain. She has HTN (hypertension); Common bile duct calculus; Common bile duct dilatation; Bile duct stone; Fever; Anemia; Chronic pain syndrome; Closed fracture of transverse process of thoracic vertebra (HCC); DDD (degenerative disc disease), cervical; Lumbar degenerative disc disease; Primary osteoarthritis of both shoulders; and Encounter for long-term opiate analgesic use on their problem list.  Pain Assessment: Location:    hips and leg pain, also neck ain Radiating:  yes to lower extremity Onset:   many years ago, >10 years Duration:  lasts many hours Quality:  aching, throbbing, sharp, shooting Severity: 4 /10 (subjective, self-reported pain score)  Effect on ADL:  Limits ability to walk and do household chores Timing:   Pain all the time, not affected by timing of day Modifying factors:  Rest, hydrocodone, Tylenol arthritis  Onset and Duration: Present longer than 3 months Cause of pain: has fallen 4 times Severity: Getting worse Timing: Night and Not influenced by the time of the day Aggravating Factors: Bending, Motion, Squatting, Stooping , Twisting and Walking Alleviating Factors: Hot packs, Lying down, Medications, Resting and Sleeping Associated Problems: Pain that wakes patient up Quality of Pain: Aching and Distressing Previous Examinations or Tests: X-rays Previous Treatments: The patient denies treatments  Patient is a very pleasant 85 year old female who presents with a chief complaint of low back pain, hip pain as well as neck pain.  Upon further discussion,  she states that she also has pain in multiple joints.  Patient has sustained falls in the last 3 to 4 months.  She fell most recently on April 30 and was evaluated in the ED on May 2.  She was having neck back and bilateral shoulder pain at that time.  Imaging was significant for right T1 transverse process fracture.  Today on discussion, patient states that her pain is improved.  She states that her primary care physician prefers her hydrocodone to be managed at the pain clinic.  This is reasonable.  Patient has not received any epidural steroid injections in the past.  She has not performed physical therapy in the past.  This is something that we can consider.  Historic Controlled Substance Pharmacotherapy Review   09/09/2018  1   09/09/2018  Hydrocodone-Acetamin 5-325 MG  30.00 5 Ma And   [id number]   [place] (4800)   0  30.00 MME  Medicare   [place]    Medications: Bottles not available for inspection. Pharmacodynamics: Desired effects: Analgesia: The patient reports >50% benefit. Reported improvement in function: The patient reports medication allows her to accomplish basic ADLs. Clinically meaningful improvement in function (CMIF): Sustained CMIF goals met Perceived effectiveness: Described as relatively effective, allowing for increase in activities of daily living (ADL) Undesirable effects: Side-effects or Adverse reactions: None reported Historical Monitoring: The patient  reports no history of drug use. List of all UDS Test(s): No results found for: MDMA, COCAINSCRNUR, [place], [place], CANNABQUANT, [place], [place] List of other Serum/Urine Drug Screening Test(s):  No results found for: AMPHSCRSER, BARBSCRSER, BENZOSCRSER, COCAINSCRSER, COCAINSCRNUR, PCPSCRSER, PCPQUANT, THCSCRSER, THCU, CANNABQUANT, OPIATESCRSER, OXYSCRSER, PROPOXSCRSER, ETH Historical Background Evaluation: [place] PMP: PDMP not reviewed this encounter. Six (6) year initial data search conducted.             [place] Department of public  safety, offender search: Editor, commissioning Information) Non-contributory Risk Assessment Profile: Aberrant behavior: None observed or detected today Risk factors for fatal opioid overdose: None identified today Fatal overdose hazard ratio (HR): Calculation deferred Non-fatal overdose hazard ratio (HR): Calculation deferred Risk of opioid abuse or dependence: 0.7-3.0% with doses ? 36 MME/day and 6.1-26% with doses ? 120 MME/day. Substance use disorder (SUD) risk level: Low Personal History of Substance Abuse (SUD-Substance use disorder):  Alcohol:    Illegal Drugs:    Rx Drugs:    ORT Risk Level calculation:    ORT Scoring interpretation table:  Score <3 = Low Risk for SUD  Score between 4-7 = Moderate Risk for SUD  Score >8 = High Risk for Opioid Abuse   Pharmacologic Plan: As per protocol, I have not taken over any controlled substance management, pending the results of ordered tests and/or consults.            Initial impression: Pending review of available data and ordered tests.  Meds   Current Outpatient Medications:  .  aspirin EC 81 MG tablet, Take 81 mg by mouth daily., Disp: , Rfl:  .  Calcium Carbonate-Vit D-Min (CALTRATE 600+D PLUS MINERALS) 600-800 MG-UNIT TABS, Take 1 tablet by mouth daily., Disp: , Rfl:  .  Cholecalciferol 2000 units TABS, Take 2,000 Units by mouth daily. , Disp: , Rfl:  .  dimenhyDRINATE (DRAMAMINE) 50 MG tablet, Take 50 mg by mouth every 8 (eight) hours as needed for dizziness. , Disp: , Rfl:  .  Homeopathic Products ([facility]) GEL, Apply 1 application topically daily as needed (pain)., Disp: , Rfl:  .  hydrochlorothiazide (HYDRODIURIL) 25 MG tablet, Take 25 mg by mouth daily., Disp: , Rfl:  .  HYDROcodone-acetaminophen (NORCO) 5-325 MG tablet, Take 1 tablet by mouth every 6 (six) hours as needed for moderate pain (Can take it up to 4 times a day.  If you only need it twice a day that is fine too.)., Disp: 20 tablet, Rfl: 0 .  HYDROcodone-acetaminophen  (NORCO/VICODIN) 5-325 MG tablet, Take 1 tablet by mouth every 4 (four) hours as needed. 1 tablet at 1900, 0.5 tablet at 0000 (Patient taking differently: Take 0.5-1 tablets by mouth every 6 (six) hours as needed for moderate pain. ), Disp: 5 tablet, Rfl: 0 .  Liniments (SALONPAS PAIN RELIEF PATCH EX), Apply 1 patch topically daily as needed (pain)., Disp: , Rfl:  .  meclizine (ANTIVERT) 25 MG tablet, Take 1 tablet (25 mg total) by mouth 3 (three) times daily as needed for dizziness., Disp: 30 tablet, Rfl: 0 .  Menthol-Methyl Salicylate (THERA-GESIC PLUS) CREA, Apply 1 application topically daily as needed (pain). , Disp: , Rfl:  .  metoprolol (LOPRESSOR) 50 MG tablet, Take 50 mg by mouth 2 (two) times daily., Disp: , Rfl:  .  potassium chloride (K-DUR) 10 MEQ tablet, Take 10 mEq by mouth daily. , Disp: , Rfl:  .  sodium chloride (OCEAN) 0.65 % SOLN nasal spray, Place 1 spray into both nostrils as needed for congestion., Disp: , Rfl:  .  Tetrahydrozoline HCl (VISINE OP), Apply 1 drop to eye daily as needed (dry eyes)., Disp: , Rfl:  .  traZODone (DESYREL) 50 MG tablet, Take 50 mg by mouth at bedtime as needed for sleep., Disp: , Rfl:  .  valsartan (DIOVAN) 320 MG tablet, Take 0.5 tablets (160 mg total) by mouth daily., Disp: 30 tablet, Rfl: 1 .  vitamin B-12 (CYANOCOBALAMIN) 1000 MCG tablet, Take 1,000 mcg by mouth daily., Disp: , Rfl:   Current Facility-Administered Medications:  .  cefoTEtan (CEFOTAN) 1 g in dextrose 5 % 50 mL IVPB, 1 g, Intravenous, Once, Clarene Essex, MD  ROS  Cardiovascular: No reported cardiovascular signs or symptoms such as High blood pressure, coronary artery disease, abnormal heart rate or rhythm, heart attack, blood thinner therapy or heart weakness and/or failure Pulmonary or Respiratory: No reported pulmonary signs or symptoms such as wheezing and difficulty taking a deep full breath (Asthma), difficulty blowing air out (Emphysema), coughing up mucus (Bronchitis),  persistent dry cough, or temporary stoppage of breathing during sleep Neurological: No reported neurological signs or symptoms such as seizures, abnormal skin sensations, urinary and/or fecal incontinence, being born with an abnormal open spine and/or a tethered spinal cord Review of Past Neurological Studies:  Results for orders placed or performed during the hospital encounter of 08/23/18  CT Head Wo Contrast   Narrative   CLINICAL DATA:  Fall 2 days prior. Base of neck, bilateral shoulder and upper back pain.  EXAM: CT HEAD WITHOUT CONTRAST  CT CERVICAL SPINE WITHOUT CONTRAST  TECHNIQUE: Multidetector CT imaging of the head and cervical spine was performed following the standard protocol without intravenous contrast. Multiplanar CT image reconstructions of the cervical spine were also generated.  COMPARISON:  10/06/2016 brain MRI.  FINDINGS: CT HEAD FINDINGS  Brain: No evidence of parenchymal hemorrhage or extra-axial fluid collection. No mass lesion, mass effect, or midline shift. No CT evidence of acute infarction. Generalized cerebral volume loss. Nonspecific mild subcortical and periventricular white matter hypodensity, most in keeping with chronic small vessel ischemic change. No ventriculomegaly.  Vascular:  No acute abnormality.  Skull: No evidence of calvarial fracture.  Sinuses/Orbits: The visualized paranasal sinuses are essentially clear.  Other:  The mastoid air cells are unopacified.  CT CERVICAL SPINE FINDINGS  Alignment: Straightening of the cervical spine. No facet subluxation. Dens is well positioned between the lateral masses of C1. Minimal 2 mm anterolisthesis at C3-4 and C5-6.  Skull base and vertebrae: Nondisplaced fracture of the tip of transverse right T1 process. No cervical spine fracture. No primary bone lesion or focal pathologic process.  Soft tissues and spinal canal: No prevertebral edema. No visible canal hematoma.  Disc levels:  Marked multilevel degenerative disc disease throughout the cervical spine, most prominent at C6-7. Advanced bilateral facet arthropathy. Mild degenerative foraminal stenosis on the right at C4-5.  Upper chest: No acute abnormality.  Other: Visualized mastoid air cells appear clear. No discrete thyroid nodules. No pathologically enlarged cervical nodes.  IMPRESSION: 1. No evidence of acute intracranial abnormality. No evidence of calvarial fracture. 2. Generalized cerebral volume loss and mild chronic small vessel ischemic changes in the cerebral white matter. 3. Nondisplaced fracture of the tip of the right T1 transverse process. No fracture within the cervical spine. No facet subluxation. 4. Marked multilevel degenerative changes in the cervical spine as detailed.   Electronically Signed   By: Ilona Sorrel M.D.   On: 08/23/2018 11:06   Results for orders placed or performed during the hospital encounter of 10/06/16  MR Brain Wo Contrast   Narrative   CLINICAL DATA:  Dizziness, near-syncope  EXAM: MRI HEAD WITHOUT CONTRAST  TECHNIQUE: Multiplanar, multiecho pulse sequences of the brain and surrounding structures were obtained without intravenous contrast.  COMPARISON:  None.  FINDINGS: Brain: Moderate atrophy. Negative for hydrocephalus. Negative for acute infarct. Mild chronic white matter changes. Negative for hemorrhage or mass or midline shift.  Vascular: Normal arterial flow void  Skull and upper cervical spine: Negative  Sinuses/Orbits: Mild mucosal edema in the paranasal sinuses. Bilateral lens replacement  Other: None  IMPRESSION: Atrophy and mild chronic white matter changes due to microvascular ischemia. No acute abnormality.   Electronically Signed   By: Franchot Gallo M.D.   On: 10/06/2016 15:00    Psychological-Psychiatric: Depressed Gastrointestinal: Inflamed pancreas (Pancreatitis) Genitourinary: Passing kidney stones Hematological:  Weakness due to low blood hemoglobin or red blood cell count (Anemia) and Brusing easily Endocrine: No reported endocrine signs or symptoms such as high or low blood sugar, rapid heart rate due to high thyroid levels, obesity or weight gain due to slow thyroid or thyroid disease Rheumatologic: Joint aches and or swelling due to excess weight (Osteoarthritis) Musculoskeletal: Negative for myasthenia gravis, muscular dystrophy, multiple sclerosis or malignant hyperthermia Work History: Retired  Allergies  Susan Blackwell is allergic to tramadol.  Laboratory Chemistry  Inflammation Markers (CRP: Acute Phase) (ESR: Chronic Phase) No results found for: CRP, ESRSEDRATE, LATICACIDVEN                       Rheumatology Markers No results found for: RF, ANA, LABURIC, URICUR, LYMEIGGIGMAB, LYMEABIGMQN, HLAB27                      Renal Function Markers Lab Results  Component Value Date   BUN 21 (H) 10/06/2016   CREATININE 1.20 (H) 10/06/2016   GFRAA 43 (L) 10/06/2016   GFRNONAA 37 (L) 10/06/2016  Hepatic Function Markers Lab Results  Component Value Date   AST 28 10/06/2016   ALT 22 10/06/2016   ALBUMIN 3.4 (L) 10/06/2016   ALKPHOS 73 10/06/2016   LIPASE 30 10/06/2016                        Electrolytes Lab Results  Component Value Date   NA 137 10/06/2016   K 3.6 10/06/2016   CL 102 10/06/2016   CALCIUM 9.0 10/06/2016                        Neuropathy Markers No results found for: VITAMINB12, FOLATE, HGBA1C, HIV                      CNS Tests No results found for: COLORCSF, APPEARCSF, RBCCOUNTCSF, WBCCSF, POLYSCSF, LYMPHSCSF, EOSCSF, PROTEINCSF, GLUCCSF, JCVIRUS, CSFOLI, IGGCSF, LABACHR, ACETBL                      Bone Pathology Markers No results found for: [place], [id number], [id number], [id number], 25OHVITD1, 25OHVITD2, 25OHVITD3, TESTOFREE, TESTOSTERONE                       Coagulation Parameters Lab Results  Component Value Date   PLT 163  10/06/2016                        Cardiovascular Markers Lab Results  Component Value Date   TROPONINI <0.03 03/10/2016   HGB 9.4 (L) 10/06/2016   HCT 28.2 (L) 10/06/2016                         ID Markers No results found for: LYMEIGGIGMAB, HIV                      CA Markers No results found for: CEA, CA125, LABCA2                      Endocrine Markers No results found for: TSH, FREET4, TESTOFREE, TESTOSTERONE, SHBG, ESTRADIOL, ESTRADIOLPCT, ESTRADIOLFRE, LABPREG, ACTH                      Note: Lab results reviewed.  Imaging Review  Cervical Imaging:  Results for orders placed during the hospital encounter of 08/23/18  CT Cervical Spine Wo Contrast   Narrative CLINICAL DATA:  Fall 2 days prior. Base of neck, bilateral shoulder and upper back pain.  EXAM: CT HEAD WITHOUT CONTRAST  CT CERVICAL SPINE WITHOUT CONTRAST  TECHNIQUE: Multidetector CT imaging of the head and cervical spine was performed following the standard protocol without intravenous contrast. Multiplanar CT image reconstructions of the cervical spine were also generated.  COMPARISON:  10/06/2016 brain MRI.  FINDINGS: CT HEAD FINDINGS  Brain: No evidence of parenchymal hemorrhage or extra-axial fluid collection. No mass lesion, mass effect, or midline shift. No CT evidence of acute infarction. Generalized cerebral volume loss. Nonspecific mild subcortical and periventricular white matter hypodensity, most in keeping with chronic small vessel ischemic change. No ventriculomegaly.  Vascular: No acute abnormality.  Skull: No evidence of calvarial fracture.  Sinuses/Orbits: The visualized paranasal sinuses are essentially clear.  Other:  The mastoid air cells are unopacified.  CT CERVICAL SPINE FINDINGS  Alignment: Straightening of the cervical spine. No facet subluxation. Dens is well positioned between the lateral  masses of C1. Minimal 2 mm anterolisthesis at C3-4 and C5-6.  Skull base  and vertebrae: Nondisplaced fracture of the tip of transverse right T1 process. No cervical spine fracture. No primary bone lesion or focal pathologic process.  Soft tissues and spinal canal: No prevertebral edema. No visible canal hematoma.  Disc levels: Marked multilevel degenerative disc disease throughout the cervical spine, most prominent at C6-7. Advanced bilateral facet arthropathy. Mild degenerative foraminal stenosis on the right at C4-5.  Upper chest: No acute abnormality.  Other: Visualized mastoid air cells appear clear. No discrete thyroid nodules. No pathologically enlarged cervical nodes.  IMPRESSION: 1. No evidence of acute intracranial abnormality. No evidence of calvarial fracture. 2. Generalized cerebral volume loss and mild chronic small vessel ischemic changes in the cerebral white matter. 3. Nondisplaced fracture of the tip of the right T1 transverse process. No fracture within the cervical spine. No facet subluxation. 4. Marked multilevel degenerative changes in the cervical spine as detailed.   Electronically Signed   By: Ilona Sorrel M.D.   On: 08/23/2018 11:06      Shoulder-L DG 1 view: No results found for this or any previous visit. Shoulder-R DG:  Results for orders placed during the hospital encounter of 08/23/18  DG Shoulder Right   Narrative CLINICAL DATA:  Status post fall, bilateral shoulder pain  EXAM: RIGHT SHOULDER - 2+ VIEW  COMPARISON:  Chest x-ray 03/14/2016  FINDINGS: Generalized osteopenia. No acute fracture or dislocation. Mild osteoarthritis of the glenohumeral joint. Mild arthropathy of the acromioclavicular joint. No aggressive osseous lesion.  IMPRESSION: No acute osseous injury of the right shoulder.   Electronically Signed   By: Kathreen Devoid   On: 08/23/2018 11:33    Shoulder-L DG:  Results for orders placed during the hospital encounter of 08/23/18  DG Shoulder Left   Narrative CLINICAL DATA:  Status  post fall, bilateral shoulder pain  EXAM: LEFT SHOULDER - 2+ VIEW  COMPARISON:  Chest x-ray 03/14/2016  FINDINGS: Generalized osteopenia. No acute fracture or dislocation. No aggressive osseous lesion. Mild osteoarthritis of the glenohumeral joint. Mild arthropathy of the acromioclavicular joint. Chondrocalcinosis of glenohumeral joint as can be seen with CPPD.  IMPRESSION: 1.  No acute osseous injury of the left shoulder. 2. Mild osteoarthritis of the left glenohumeral joint.   Electronically Signed   By: Kathreen Devoid   On: 08/23/2018 11:32     Thoracic CT wo contrast:  Results for orders placed during the hospital encounter of 08/23/18  CT Thoracic Spine Wo Contrast   Narrative CLINICAL DATA:  Fall 2 days ago with back pain bilateral shoulder pain since a fall.  EXAM: CT THORACIC SPINE WITHOUT CONTRAST  TECHNIQUE: Multidetector CT images of the thoracic were obtained using the standard protocol without intravenous contrast.  COMPARISON:  None.  FINDINGS: Alignment: No traumatic malalignment. Mild degenerative thoracic dextrocurvature.  Vertebrae: Negative for vertebral fracture. The posterior left eighth rib is fractured on a chronic basis when compared with 09/30/2017 chest CT.  Paraspinal and other soft tissues: No acute finding in the lungs when allowing for motion artifact. There is trace pleural fluid or pleural thickening in the posterior left chest. Atherosclerosis.  Disc levels: Diffuse degenerative disc narrowing and endplate spurring  IMPRESSION: No acute finding.  Negative for thoracic spine fracture.   Electronically Signed   By: Monte Fantasia M.D.   On: 08/23/2018 12:23     Thoracic DG 2-3 views:  Results for orders placed during the hospital encounter of  08/23/18  DG Thoracic Spine 2 View   Narrative CLINICAL DATA:  [place] 2 days ago.  Severe upper back pain.  EXAM: THORACIC SPINE 2 VIEWS  COMPARISON:  Chest CT  09/30/2017  FINDINGS: Mild S-shaped scoliosis in the spine. Dextroscoliosis of the thoracic spine with levoscoliosis of the upper lumbar spine. Vertebral body heights are maintained. No evidence for a compression fracture. Atherosclerotic calcifications in the aorta.  IMPRESSION: 1. No acute abnormality in the thoracic spine. 2. Scoliosis involving the thoracic and lumbar spine.   Electronically Signed   By: Markus Daft M.D.   On: 08/23/2018 11:36     Hand-L DG Complete: No results found for this or any previous visit.  Complexity Note: Imaging results reviewed. Results shared with Susan Blackwell, using Layman's terms.                         PFSH  Drug: Susan Blackwell  reports no history of drug use. Alcohol:  reports no history of alcohol use. Tobacco:  reports that she has never smoked. She has never used smokeless tobacco. Medical:  has a past medical history of Bile duct stone (02/2016), Hypertension, and PONV (postoperative nausea and vomiting). Family: family history is not on file.  Past Surgical History:  Procedure Laterality Date  . BREAST BIOPSY    . CHOLECYSTECTOMY    . ERCP N/A 03/11/2016   Procedure: ENDOSCOPIC RETROGRADE CHOLANGIOPANCREATOGRAPHY (ERCP);  Surgeon: Clarene Essex, MD;  Location: [facility] ENDOSCOPY;  Service: Endoscopy;  Laterality: N/A;  . ERCP N/A 06/18/2016   Procedure: ENDOSCOPIC RETROGRADE CHOLANGIOPANCREATOGRAPHY (ERCP);  Surgeon: Clarene Essex, MD;  Location: [facility] ENDOSCOPY;  Service: Endoscopy;  Laterality: N/A;  moved for litho [place]  . GASTROINTESTINAL STENT REMOVAL N/A 06/18/2016   Procedure: GASTROINTESTINAL STENT REMOVAL;  Surgeon: Clarene Essex, MD;  Location: [facility] ENDOSCOPY;  Service: Endoscopy;  Laterality: N/A;  . SPYGLASS CHOLANGIOSCOPY N/A 06/18/2016   Procedure: [id number] CHOLANGIOSCOPY;  Surgeon: Clarene Essex, MD;  Location: [facility] ENDOSCOPY;  Service: Endoscopy;  Laterality: N/A;  Bess Kinds LITHOTRIPSY N/A 06/18/2016   Procedure: TSVXBLTJ LITHOTRIPSY;  Surgeon:  Clarene Essex, MD;  Location: [facility] ENDOSCOPY;  Service: Endoscopy;  Laterality: N/A;   Active Ambulatory Problems    Diagnosis Date Noted  . HTN (hypertension) 03/10/2016  . Common bile duct calculus 03/10/2016  . Common bile duct dilatation 03/10/2016  . Bile duct stone 03/11/2016  . Fever   . Anemia 03/25/2016  . Chronic pain syndrome 09/25/2018  . Closed fracture of transverse process of thoracic vertebra ([place]) 09/25/2018  . DDD (degenerative disc disease), cervical 09/25/2018  . Lumbar degenerative disc disease 09/25/2018  . Primary osteoarthritis of both shoulders 09/25/2018  . Encounter for long-term opiate analgesic use 09/25/2018   Resolved Ambulatory Problems    Diagnosis Date Noted  . No Resolved Ambulatory Problems   Past Medical History:  Diagnosis Date  . Hypertension   . PONV (postoperative nausea and vomiting)    Assessment  Primary Diagnosis & Pertinent Problem List: The primary encounter diagnosis was Chronic pain syndrome. Diagnoses of Closed fracture of transverse process of thoracic vertebra, sequela, DDD (degenerative disc disease), cervical, Lumbar degenerative disc disease, Primary osteoarthritis of both shoulders, and Encounter for long-term opiate analgesic use were also pertinent to this visit.  Visit Diagnosis (New problems to examiner): 1. Chronic pain syndrome   2. Closed fracture of transverse process of thoracic vertebra, sequela   3. DDD (degenerative disc disease), cervical   4. Lumbar  degenerative disc disease   5. Primary osteoarthritis of both shoulders   6. Encounter for long-term opiate analgesic use    Plan of Care (Initial workup plan)  Note: Susan Blackwell was reminded that as per protocol, today's visit has been an evaluation only. We have not taken over the patient's controlled substance management.  Being referred here for chronic pain management.  Has been on long-term opioid therapy, hydrocodone 5 mg daily PRN, quantity 30/month.  States  that PCP would like for her to transition her pain management care to the pain clinic.  This is reasonable.  Will obtain UDS.    Lab Orders     Compliance Drug Analysis, Ur   Pharmacological management options:  Opioid Analgesics: The patient was informed that there is no guarantee that she would be a candidate for opioid analgesics. The decision will be made following CDC guidelines. This decision will be based on the results of diagnostic studies, as well as Susan Blackwell's risk profile.   Membrane stabilizer: To be determined at a later time  Muscle relaxant: To be determined at a later time  NSAID: To be determined at a later time  Other analgesic(s): To be determined at a later time   Interventional management options: Susan Blackwell was informed that there is no guarantee that she would be a candidate for interventional therapies. The decision will be based on the results of diagnostic studies, as well as Susan Blackwell's risk profile.  Procedure(s) under consideration:  Diagnostic cervical facet medial branch nerve blocks Diagnostic lumbar facet medial branch nerve blocks Trigger point injection   Provider-requested follow-up: Return in about 19 days (around 10/14/2018) for Medication Management.  No future appointments.  Total duration of non-face-to-face encounter:34mnutes.  Primary Care Physician: AKirk Ruths MD [facility] Note by: BGillis Santa MD Date: 09/25/2018; Time: 12:54 PM  Note: This dictation was prepared with Dragon dictation. Any transcriptional errors that may result from this process are unintentional.

## 2018-09-25 ENCOUNTER — Other Ambulatory Visit: Payer: Self-pay

## 2018-09-25 ENCOUNTER — Encounter: Payer: Self-pay | Admitting: Student in an Organized Health Care Education/Training Program

## 2018-09-25 ENCOUNTER — Ambulatory Visit
Payer: Medicare Other | Attending: Student in an Organized Health Care Education/Training Program | Admitting: Student in an Organized Health Care Education/Training Program

## 2018-09-25 VITALS — Ht <= 58 in | Wt 96.0 lb

## 2018-09-25 DIAGNOSIS — S22009S Unspecified fracture of unspecified thoracic vertebra, sequela: Secondary | ICD-10-CM | POA: Diagnosis not present

## 2018-09-25 DIAGNOSIS — M5136 Other intervertebral disc degeneration, lumbar region: Secondary | ICD-10-CM

## 2018-09-25 DIAGNOSIS — M5030 Other cervical disc degeneration, unspecified cervical region: Secondary | ICD-10-CM

## 2018-09-25 DIAGNOSIS — M19011 Primary osteoarthritis, right shoulder: Secondary | ICD-10-CM

## 2018-09-25 DIAGNOSIS — G894 Chronic pain syndrome: Secondary | ICD-10-CM | POA: Diagnosis not present

## 2018-09-25 DIAGNOSIS — S22009A Unspecified fracture of unspecified thoracic vertebra, initial encounter for closed fracture: Secondary | ICD-10-CM | POA: Insufficient documentation

## 2018-09-25 DIAGNOSIS — M19012 Primary osteoarthritis, left shoulder: Secondary | ICD-10-CM

## 2018-09-25 DIAGNOSIS — Z79891 Long term (current) use of opiate analgesic: Secondary | ICD-10-CM

## 2018-09-30 ENCOUNTER — Other Ambulatory Visit
Admission: RE | Admit: 2018-09-30 | Discharge: 2018-09-30 | Disposition: A | Discharge status: Home / Self Care | Payer: Medicare Other | Attending: Student in an Organized Health Care Education/Training Program | Admitting: Student in an Organized Health Care Education/Training Program

## 2018-09-30 ENCOUNTER — Other Ambulatory Visit: Payer: Self-pay

## 2018-09-30 DIAGNOSIS — G894 Chronic pain syndrome: Secondary | ICD-10-CM | POA: Diagnosis present

## 2018-09-30 LAB — URINE DRUG SCREEN, QUALITATIVE (ARMC ONLY)
Amphetamines, Ur Screen: NOT DETECTED
Barbiturates, Ur Screen: NOT DETECTED
Benzodiazepine, Ur Scrn: NOT DETECTED
Cannabinoid 50 Ng, Ur ~~LOC~~: NOT DETECTED
Cocaine Metabolite,Ur ~~LOC~~: NOT DETECTED
MDMA (Ecstasy)Ur Screen: NOT DETECTED
Methadone Scn, Ur: NOT DETECTED
Opiate, Ur Screen: NOT DETECTED
Phencyclidine (PCP) Ur S: NOT DETECTED
Tricyclic, Ur Screen: NOT DETECTED

## 2018-10-13 ENCOUNTER — Encounter: Payer: Self-pay | Admitting: Student in an Organized Health Care Education/Training Program

## 2018-10-14 ENCOUNTER — Other Ambulatory Visit: Payer: Self-pay

## 2018-10-14 ENCOUNTER — Encounter: Payer: Self-pay | Admitting: Student in an Organized Health Care Education/Training Program

## 2018-10-14 ENCOUNTER — Ambulatory Visit
Payer: Medicare Other | Attending: Student in an Organized Health Care Education/Training Program | Admitting: Student in an Organized Health Care Education/Training Program

## 2018-10-14 DIAGNOSIS — M5136 Other intervertebral disc degeneration, lumbar region: Secondary | ICD-10-CM

## 2018-10-14 DIAGNOSIS — S22009S Unspecified fracture of unspecified thoracic vertebra, sequela: Secondary | ICD-10-CM

## 2018-10-14 DIAGNOSIS — M19011 Primary osteoarthritis, right shoulder: Secondary | ICD-10-CM

## 2018-10-14 DIAGNOSIS — G894 Chronic pain syndrome: Secondary | ICD-10-CM

## 2018-10-14 DIAGNOSIS — M1712 Unilateral primary osteoarthritis, left knee: Secondary | ICD-10-CM

## 2018-10-14 DIAGNOSIS — M5030 Other cervical disc degeneration, unspecified cervical region: Secondary | ICD-10-CM | POA: Diagnosis not present

## 2018-10-14 DIAGNOSIS — M19012 Primary osteoarthritis, left shoulder: Secondary | ICD-10-CM

## 2018-10-14 DIAGNOSIS — Z79891 Long term (current) use of opiate analgesic: Secondary | ICD-10-CM

## 2018-10-14 MED ORDER — HYDROCODONE-ACETAMINOPHEN 5-325 MG PO TABS: 1.0000 | ORAL_TABLET | Freq: Every day | ORAL | 0 refills | Status: DC | PRN

## 2018-10-14 NOTE — Progress Notes (Signed)
Patient's Name: Susan Blackwell  MRN: [id number]  Referring Provider: Kirk Ruths, MD  DOB: [birth year]  PCP: Kirk Ruths, MD  DOS: 10/14/2018  Note by: Gillis Santa, MD  Service setting: Virtual Visit (Telephone)  Attending: Gillis Santa, MD  Location: Telephone Encounter  Specialty: Interventional Pain Management  Patient type: Established   Pain Management Virtual Encounter Note - Virtual Visit via Telephone Telehealth (real-time audio visits between healthcare provider and patient).   Patient's Phone No.:  [phone] (home); [phone] (mobile); (Preferred) [phone] No e-mail address on record  [place], [place] [address] Phone: [phone] Fax: [place], [place] [address] Phone: [phone] Fax: [phone]    Pre-screening note:  Our staff contacted Susan Blackwell and offered her an "in person", "face-to-face" appointment versus a telephone encounter. She indicated preferring the telephone encounter, at this time.   Primary Reason(s) for Virtual Visit: Encounter for evaluation before starting new chronic pain management plan of care (Level of risk: moderate) COVID-19*  Social distancing based on CDC ans AMA recommendations.    I contacted Susan Blackwell on 10/14/2018 via telephone.      I clearly identified myself as Gillis Santa, MD. I verified that I was speaking with the correct person using two identifiers (Name: Susan Blackwell, and date of birth: 1919-06-22).  Advanced Informed Consent I sought verbal advanced consent from Susan Blackwell for virtual visit interactions. I informed Susan Blackwell of possible security and privacy concerns, risks, and limitations associated with providing "not-in-person" medical evaluation and management services. I also informed Susan Blackwell of the  availability of "in-person" appointments. Finally, I informed her that there would be a charge for the virtual visit and that she could be  personally, fully or partially, financially responsible for it. Susan Blackwell expressed understanding and agreed to proceed.   Historic Elements   Susan Blackwell is a 85 y.o. year old, female patient evaluated today after her last encounter by our practice on 09/25/2018. Susan Blackwell  has a past medical history of Bile duct stone (02/2016), Hypertension, and PONV (postoperative nausea and vomiting). She also  has a past surgical history that includes Cholecystectomy; ERCP (N/A, 03/11/2016); Breast biopsy; ERCP (N/A, 06/18/2016); Gastrointestinal stent removal (N/A, 06/18/2016); Spyglass cholangioscopy (N/A, 06/18/2016); and spyglass lithotripsy (N/A, 06/18/2016). Susan Blackwell has a current medication list which includes the following prescription(s): acetaminophen, aspirin ec, caltrate 600+d plus minerals, cholecalciferol, arnicare, hydrochlorothiazide, hydrocodone-acetaminophen, hydrocodone-acetaminophen, liniments, losartan, magnesium hydroxide, meclizine, thera-gesic plus, metoprolol tartrate, pantoprazole, potassium chloride, tetrahydrozoline hcl, vitamin b-12, dimenhydrinate, sodium chloride, and trazodone, and the following Facility-Administered Medications: cefoTEtan (CEFOTAN) 1 g in dextrose 5 % 50 mL IVPB. She  reports that she has never smoked. She has never used smokeless tobacco. She reports that she does not drink alcohol or use drugs. Susan Blackwell is allergic to tramadol and nsaids.   HPI  She is being evaluated for review of studies ordered on initial visit and to consider treatment plan options. Today I went over the results of her tests. These were explained in "Layman's terms". During today's appointment I went over my diagnostic impression, as well as the proposed treatment plan.  No change in medical history, was able to complete urine drug screen with results  below.  UDS was unremarkable.  I informed the patient that we will take over hydrocodone 5 mg, quantity 30/month.  She is endorsing left knee pain.  Patient denies having received left steroid intra-articular knee injection.  Discussed procedure with patient.  Risks and benefits explained.  PRN order placed if patient would like to proceed.  Controlled Substance Pharmacotherapy Assessment REMS (Risk Evaluation and Mitigation Strategy)   10/08/2018  1   10/08/2018  Hydrocodone-Acetamin 5-325 MG  30.00 5 Ma And   [id number]   Gib (4800)   0  30.00 MME  Medicare   [place]     Monitoring: [place] PMP: PDMP reviewed during this encounter.       Not applicable at this point since we have not taken over the patient's medication management yet. List of other Serum/Urine Drug Screening Test(s):  Lab Results  Component Value Date   COCAINSCRNUR NONE DETECTED 09/30/2018   THCU NONE DETECTED 82/64/1583   Tricyclic, Ur Screen NONE DETECTED NONE DETECTED   Amphetamines, Ur Screen NONE DETECTED NONE DETECTED   MDMA (Ecstasy)Ur Screen NONE DETECTED NONE DETECTED   Cocaine Metabolite,Ur [place] NONE DETECTED NONE DETECTED   Opiate, Ur Screen NONE DETECTED NONE DETECTED   Phencyclidine (PCP) Ur S NONE DETECTED NONE DETECTED   Cannabinoid 50 Ng, Ur [place] NONE DETECTED NONE DETECTED   Barbiturates, Ur Screen NONE DETECTED NONE DETECTED   Benzodiazepine, Ur Scrn NONE DETECTED NONE DETECTED   Methadone Scn, Ur NONE DETECTED NONE DETECTED      List of all UDS test(s) done:  No results found for: TOXASSSELUR, SUMMARY Last UDS on record: No results found for: TOXASSSELUR, SUMMARY UDS interpretation: No unexpected findings.          Medication Assessment Form: Not applicable. Treatment compliance: Treatment may start today if patient agrees with proposed plan. Evaluation of compliance is not applicable at this point Risk Assessment Profile: Aberrant behavior: See initial evaluations. None observed or detected today Comorbid  factors increasing risk of overdose: See initial evaluation. No additional risks detected today Opioid risk tool (ORT):  No flowsheet data found.  ORT Scoring interpretation table:  Score <3 = Low Risk for SUD  Score between 4-7 = Moderate Risk for SUD  Score >8 = High Risk for Opioid Abuse   Risk of substance use disorder (SUD): Low  Risk Mitigation Strategies:  Patient opioid safety counseling: Covered.  I do not feel comfortable having the patient come to the hospital at her given age to sign a pain contract.  She is low risk for opioid misuse abuse.  We will have her sign pain contract when she comes in for her PRN knee injection for 2 months medication management refill. Patient-Prescriber Agreement (PPA): No agreement signed.  Will obtain at next visit but I have verbally informed the patient and her family member that she has a pain contract with the pain clinic and I explained to her what that entails. Controlled substance notification to other providers: Written and sent today.  Pharmacologic Plan: Today we may be taking over the patient's pharmacological regimen. See below.             Meds   Current Outpatient Medications:  .  Acetaminophen (TYLENOL ARTHRITIS PAIN PO), Take by mouth daily., Disp: , Rfl:  .  aspirin EC 81 MG tablet, Take 81 mg by mouth daily., Disp: , Rfl:  .  Calcium Carbonate-Vit D-Min (CALTRATE 600+D PLUS MINERALS) 600-800 MG-UNIT TABS, Take 1 tablet by mouth daily., Disp: , Rfl:  .  Cholecalciferol 2000 units TABS, Take 2,000 Units by mouth daily. , Disp: , Rfl:  .  Homeopathic Products (ARNICARE) GEL, Apply 1 application topically daily as needed (pain)., Disp: , Rfl:  .  hydrochlorothiazide (HYDRODIURIL) 25 MG tablet, Take 25 mg by mouth daily., Disp: , Rfl:  .  [START ON 11/07/2018] HYDROcodone-acetaminophen (NORCO) 5-325 MG tablet, Take 1 tablet by mouth daily as needed for up to 30 days for moderate pain. For chronic pain.  To last 30 days., Disp: 30  tablet, Rfl: 0 .  [START ON 12/07/2018] HYDROcodone-acetaminophen (NORCO) 5-325 MG tablet, Take 1 tablet by mouth daily as needed for up to 30 days for moderate pain. For chronic pain.  To last 30 days., Disp: 30 tablet, Rfl: 0 .  Liniments (SALONPAS PAIN RELIEF PATCH EX), Apply 1 patch topically daily as needed (pain)., Disp: , Rfl:  .  losartan (COZAAR) 100 MG tablet, Take 100 mg by mouth daily., Disp: , Rfl:  .  magnesium hydroxide (MILK OF MAGNESIA) 400 MG/5ML suspension, Take by mouth daily as needed for mild constipation., Disp: , Rfl:  .  meclizine (ANTIVERT) 25 MG tablet, Take 1 tablet (25 mg total) by mouth 3 (three) times daily as needed for dizziness., Disp: 30 tablet, Rfl: 0 .  Menthol-Methyl Salicylate (THERA-GESIC PLUS) CREA, Apply 1 application topically daily as needed (pain). , Disp: , Rfl:  .  metoprolol (LOPRESSOR) 50 MG tablet, Take 50 mg by mouth 2 (two) times daily., Disp: , Rfl:  .  pantoprazole (PROTONIX) 40 MG tablet, Take 40 mg by mouth daily., Disp: , Rfl:  .  potassium chloride (K-DUR) 10 MEQ tablet, Take 10 mEq by mouth daily. , Disp: , Rfl:  .  Tetrahydrozoline HCl (VISINE OP), Apply 1 drop to eye daily as needed (dry eyes)., Disp: , Rfl:  .  vitamin B-12 (CYANOCOBALAMIN) 1000 MCG tablet, Take 1,000 mcg by mouth daily., Disp: , Rfl:  .  dimenhyDRINATE (DRAMAMINE) 50 MG tablet, Take 50 mg by mouth every 8 (eight) hours as needed for dizziness. , Disp: , Rfl:  .  sodium chloride (OCEAN) 0.65 % SOLN nasal spray, Place 1 spray into both nostrils as needed for congestion., Disp: , Rfl:  .  traZODone (DESYREL) 50 MG tablet, Take 50 mg by mouth at bedtime as needed for sleep., Disp: , Rfl:   Current Facility-Administered Medications:  .  cefoTEtan (CEFOTAN) 1 g in dextrose 5 % 50 mL IVPB, 1 g, Intravenous, Once, Clarene Essex, MD  Laboratory Chemistry   SAFETY SCREENING Profile No results found for: [place], [place], STAPHAUREUS, MRSAPCR, HCVAB, HIV,  PREGTESTUR Inflammation Markers (CRP: Acute Phase) (ESR: Chronic Phase) No results found for: CRP, ESRSEDRATE, LATICACIDVEN                       Rheumatology Markers No results found for: RF, ANA, LABURIC, URICUR, LYMEIGGIGMAB, LYMEABIGMQN, HLAB27                      Renal Function Markers Lab Results  Component Value Date   BUN 21 (H) 10/06/2016   CREATININE 1.20 (H) 10/06/2016   GFRAA 43 (L) 10/06/2016   GFRNONAA 37 (L) 10/06/2016                             Hepatic Function Markers Lab Results  Component Value Date   AST 28 10/06/2016   ALT 22 10/06/2016   ALBUMIN 3.4 (L) 10/06/2016   ALKPHOS 73 10/06/2016   LIPASE 30 10/06/2016  Electrolytes Lab Results  Component Value Date   NA 137 10/06/2016   K 3.6 10/06/2016   CL 102 10/06/2016   CALCIUM 9.0 10/06/2016                         Coagulation Parameters Lab Results  Component Value Date   PLT 163 10/06/2016                        Cardiovascular Markers Lab Results  Component Value Date   TROPONINI <0.03 03/10/2016   HGB 9.4 (L) 10/06/2016   HCT 28.2 (L) 10/06/2016                         Note: Lab results reviewed.  Recent Diagnostic Imaging Review  Cervical Imaging:  Cervical CT wo contrast:  Results for orders placed during the hospital encounter of 08/23/18  CT Cervical Spine Wo Contrast   Narrative CLINICAL DATA:  Fall 2 days prior. Base of neck, bilateral shoulder and upper back pain.  EXAM: CT HEAD WITHOUT CONTRAST  CT CERVICAL SPINE WITHOUT CONTRAST  TECHNIQUE: Multidetector CT imaging of the head and cervical spine was performed following the standard protocol without intravenous contrast. Multiplanar CT image reconstructions of the cervical spine were also generated.  COMPARISON:  10/06/2016 brain MRI.  FINDINGS: CT HEAD FINDINGS  Brain: No evidence of parenchymal hemorrhage or extra-axial fluid collection. No mass lesion, mass effect, or midline  shift. No CT evidence of acute infarction. Generalized cerebral volume loss. Nonspecific mild subcortical and periventricular white matter hypodensity, most in keeping with chronic small vessel ischemic change. No ventriculomegaly.  Vascular: No acute abnormality.  Skull: No evidence of calvarial fracture.  Sinuses/Orbits: The visualized paranasal sinuses are essentially clear.  Other:  The mastoid air cells are unopacified.  CT CERVICAL SPINE FINDINGS  Alignment: Straightening of the cervical spine. No facet subluxation. Dens is well positioned between the lateral masses of C1. Minimal 2 mm anterolisthesis at C3-4 and C5-6.  Skull base and vertebrae: Nondisplaced fracture of the tip of transverse right T1 process. No cervical spine fracture. No primary bone lesion or focal pathologic process.  Soft tissues and spinal canal: No prevertebral edema. No visible canal hematoma.  Disc levels: Marked multilevel degenerative disc disease throughout the cervical spine, most prominent at C6-7. Advanced bilateral facet arthropathy. Mild degenerative foraminal stenosis on the right at C4-5.  Upper chest: No acute abnormality.  Other: Visualized mastoid air cells appear clear. No discrete thyroid nodules. No pathologically enlarged cervical nodes.  IMPRESSION: 1. No evidence of acute intracranial abnormality. No evidence of calvarial fracture. 2. Generalized cerebral volume loss and mild chronic small vessel ischemic changes in the cerebral white matter. 3. Nondisplaced fracture of the tip of the right T1 transverse process. No fracture within the cervical spine. No facet subluxation. 4. Marked multilevel degenerative changes in the cervical spine as detailed.   Electronically Signed   By: Ilona Sorrel M.D.   On: 08/23/2018 11:06     Results for orders placed during the hospital encounter of 08/23/18  DG Shoulder Right   Narrative CLINICAL DATA:  Status post fall,  bilateral shoulder pain  EXAM: RIGHT SHOULDER - 2+ VIEW  COMPARISON:  Chest x-ray 03/14/2016  FINDINGS: Generalized osteopenia. No acute fracture or dislocation. Mild osteoarthritis of the glenohumeral joint. Mild arthropathy of the acromioclavicular joint. No aggressive osseous lesion.  IMPRESSION: No acute osseous  injury of the right shoulder.   Electronically Signed   By: Kathreen Devoid   On: 08/23/2018 11:33    Shoulder-L DG:  Results for orders placed during the hospital encounter of 08/23/18  DG Shoulder Left   Narrative CLINICAL DATA:  Status post fall, bilateral shoulder pain  EXAM: LEFT SHOULDER - 2+ VIEW  COMPARISON:  Chest x-ray 03/14/2016  FINDINGS: Generalized osteopenia. No acute fracture or dislocation. No aggressive osseous lesion. Mild osteoarthritis of the glenohumeral joint. Mild arthropathy of the acromioclavicular joint. Chondrocalcinosis of glenohumeral joint as can be seen with CPPD.  IMPRESSION: 1.  No acute osseous injury of the left shoulder. 2. Mild osteoarthritis of the left glenohumeral joint.   Electronically Signed   By: Kathreen Devoid   On: 08/23/2018 11:32    Results for orders placed during the hospital encounter of 08/23/18  CT Thoracic Spine Wo Contrast   Narrative CLINICAL DATA:  Fall 2 days ago with back pain bilateral shoulder pain since a fall.  EXAM: CT THORACIC SPINE WITHOUT CONTRAST  TECHNIQUE: Multidetector CT images of the thoracic were obtained using the standard protocol without intravenous contrast.  COMPARISON:  None.  FINDINGS: Alignment: No traumatic malalignment. Mild degenerative thoracic dextrocurvature.  Vertebrae: Negative for vertebral fracture. The posterior left eighth rib is fractured on a chronic basis when compared with 09/30/2017 chest CT.  Paraspinal and other soft tissues: No acute finding in the lungs when allowing for motion artifact. There is trace pleural fluid or pleural thickening  in the posterior left chest. Atherosclerosis.  Disc levels: Diffuse degenerative disc narrowing and endplate spurring  IMPRESSION: No acute finding.  Negative for thoracic spine fracture.   Electronically Signed   By: Monte Fantasia M.D.   On: 08/23/2018 12:23    Results for orders placed during the hospital encounter of 08/23/18  DG Thoracic Spine 2 View   Narrative CLINICAL DATA:  [place] 2 days ago.  Severe upper back pain.  EXAM: THORACIC SPINE 2 VIEWS  COMPARISON:  Chest CT 09/30/2017  FINDINGS: Mild S-shaped scoliosis in the spine. Dextroscoliosis of the thoracic spine with levoscoliosis of the upper lumbar spine. Vertebral body heights are maintained. No evidence for a compression fracture. Atherosclerotic calcifications in the aorta.  IMPRESSION: 1. No acute abnormality in the thoracic spine. 2. Scoliosis involving the thoracic and lumbar spine.   Electronically Signed   By: Markus Daft M.D.   On: 08/23/2018 11:36    Complexity Note: Imaging results reviewed. Results shared with Ms. Bulow, using Layman's terms.                         Assessment  The primary encounter diagnosis was Primary osteoarthritis of left knee. Diagnoses of Chronic pain syndrome, Closed fracture of transverse process of thoracic vertebra, sequela, DDD (degenerative disc disease), cervical, Lumbar degenerative disc disease, Primary osteoarthritis of both shoulders, and Encounter for long-term opiate analgesic use were also pertinent to this visit.  Plan of Care  I have discontinued Nitara Genova's HYDROcodone-acetaminophen and valsartan. I have also changed her HYDROcodone-acetaminophen. Additionally, I am having her start on HYDROcodone-acetaminophen. Lastly, I am having her maintain her potassium chloride, metoprolol tartrate, dimenhyDRINATE, Caltrate 600+D Plus Minerals, vitamin B-12, Cholecalciferol, Thera-Gesic Plus, aspirin EC, Liniments (SALONPAS PAIN RELIEF PATCH EX), Arnicare,  traZODone, hydrochlorothiazide, Tetrahydrozoline HCl (VISINE OP), sodium chloride, meclizine, losartan, pantoprazole, magnesium hydroxide, and Acetaminophen (TYLENOL ARTHRITIS PAIN PO). We will continue to administer cefoTEtan (CEFOTAN) 1 g in dextrose  5 % 50 mL IVPB. Pharmacotherapy (Medications Ordered): Meds ordered this encounter  Medications  . HYDROcodone-acetaminophen (NORCO) 5-325 MG tablet    Sig: Take 1 tablet by mouth daily as needed for up to 30 days for moderate pain. For chronic pain.  To last 30 days.    Dispense:  30 tablet    Refill:  0  . HYDROcodone-acetaminophen (NORCO) 5-325 MG tablet    Sig: Take 1 tablet by mouth daily as needed for up to 30 days for moderate pain. For chronic pain.  To last 30 days.    Dispense:  30 tablet    Refill:  0    Procedure Orders     KNEE INJECTION  Orders:  Orders Placed This Encounter  Procedures  . KNEE INJECTION    For knee pain.    Standing Status:   Standing    Number of Occurrences:   2    Standing Expiration Date:   10/14/2019    Scheduling Instructions:     Side: LEFT     Sedation: None     TIMEFRAME: PRN procedure. (Ms. Hines will call when needed.)    Order Specific Question:   Where will this procedure be performed?    Answer:   [facility]   Pharmacological management options:  Opioid Analgesics: We'll take over management today. See above orders Membrane stabilizer: Options discussed, including a trial. Muscle relaxant: Not indicated NSAID: Tried and failed Other analgesic(s): To be determined at a later time    Considering:   Left knee Hyalgan series   PRN Procedures: Left knee intra-articular steroid injection   Total duration of non-face-to-face encounter: 84mnutes.  Follow-up plan:   No follow-ups on file.    Recent Visits Date Type Provider Dept  09/25/18 Office Visit LGillis Santa MD [facility]  Showing recent visits within past 90 days and meeting all other requirements    Today's Visits Date Type Provider Dept  10/14/18 Office Visit LGillis Santa MD [facility]  Showing today's visits and meeting all other requirements   Future Appointments No visits were found meeting these conditions.  Showing future appointments within next 90 days and meeting all other requirements   Primary Care Physician: AKirk Ruths MD Location: Telephone Virtual Visit Note by: BGillis Santa MD Date: 10/14/2018; Time: 2:57 PM  Note: This dictation was prepared with Dragon dictation. Any transcriptional errors that may result from this process are unintentional.  Disclaimer:  * Given the special circumstances of the COVID-19 pandemic, the federal government has announced that the Office for Civil Rights (OCR) will exercise its enforcement discretion and will not impose penalties on physicians using telehealth in the event of noncompliance with regulatory requirements under the [place](HIPAA) in connection with the good faith provision of telehealth during the CVCBSW-96national public health emergency. ([place]

## 2018-12-25 NOTE — Progress Notes (Signed)
Attempted  to call patient no answer, Left message on answering machine  So we can obtain some information for Tuesday visit.

## 2018-12-30 ENCOUNTER — Other Ambulatory Visit: Payer: Self-pay

## 2018-12-30 ENCOUNTER — Ambulatory Visit
Payer: Medicare Other | Attending: Student in an Organized Health Care Education/Training Program | Admitting: Student in an Organized Health Care Education/Training Program

## 2018-12-30 ENCOUNTER — Encounter: Payer: Self-pay | Admitting: Student in an Organized Health Care Education/Training Program

## 2018-12-30 VITALS — BP 194/67 | HR 75 | Temp 97.7°F | Ht <= 58 in | Wt 96.0 lb

## 2018-12-30 DIAGNOSIS — M1712 Unilateral primary osteoarthritis, left knee: Secondary | ICD-10-CM

## 2018-12-30 DIAGNOSIS — M5030 Other cervical disc degeneration, unspecified cervical region: Secondary | ICD-10-CM

## 2018-12-30 DIAGNOSIS — S22009S Unspecified fracture of unspecified thoracic vertebra, sequela: Secondary | ICD-10-CM | POA: Diagnosis not present

## 2018-12-30 DIAGNOSIS — M19011 Primary osteoarthritis, right shoulder: Secondary | ICD-10-CM

## 2018-12-30 DIAGNOSIS — M19012 Primary osteoarthritis, left shoulder: Secondary | ICD-10-CM

## 2018-12-30 DIAGNOSIS — Z79891 Long term (current) use of opiate analgesic: Secondary | ICD-10-CM

## 2018-12-30 MED ORDER — HYDROCODONE-ACETAMINOPHEN 5-325 MG PO TABS: 1.0000 | ORAL_TABLET | Freq: Every day | ORAL | 0 refills | Status: DC | PRN

## 2018-12-30 NOTE — Progress Notes (Signed)
Safety precautions to be maintained throughout the outpatient stay will include: orient to surroundings, keep bed in low position, maintain call bell within reach at all times, provide assistance with transfer out of bed and ambulation.  

## 2018-12-30 NOTE — Progress Notes (Signed)
Patient's Name: Susan Blackwell  MRN: [id number]  Referring Provider: Kirk Ruths, MD  DOB: 1919-12-16  PCP: Kirk Ruths, MD  DOS: 12/30/2018  Note by: Gillis Santa, MD  Service setting: Ambulatory outpatient  Attending: Gillis Santa, MD  Location: [facility] (AMB) Pain Management Facility  Specialty: Interventional Pain Management  Patient type: Established   Primary Reason(s) for Visit: Encounter for prescription drug management. (Level of risk: moderate)  CC: Hip Pain (bilateral) and Leg Pain (bilateral)  HPI  Susan Blackwell is a 85 y.o. year old, female patient, who comes today for a medication management evaluation. She has HTN (hypertension); Common bile duct calculus; Common bile duct dilatation; Bile duct stone; Fever; Anemia; Chronic pain syndrome; Closed fracture of transverse process of thoracic vertebra (HCC); DDD (degenerative disc disease), cervical; Lumbar degenerative disc disease; Primary osteoarthritis of both shoulders; and Encounter for long-term opiate analgesic use on their problem list. Her primarily concern today is the Hip Pain (bilateral) and Leg Pain (bilateral)  Pain Assessment: Location: Right, Left Hip Radiating: legs Onset: More than a month ago Duration: Chronic pain Quality: Aching, Constant Severity: 3 /10 (subjective, self-reported pain score)  Note: Reported level is compatible with observation.                         When using our objective Pain Scale, levels between 6 and 10/10 are said to belong in an emergency room, as it progressively worsens from a 6/10, described as severely limiting, requiring emergency care not usually available at an outpatient pain management facility. At a 6/10 level, communication becomes difficult and requires great effort. Assistance to reach the emergency department may be required. Facial flushing and profuse sweating along with potentially dangerous increases in heart rate and blood pressure will be evident. Effect on  ADL:   Timing: Constant Modifying factors: hydrododone and tylenol combination BP: (!) 194/67  HR: 75  Susan Blackwell was last scheduled for an appointment on 10/14/2018 for medication management. During today's appointment we reviewed Susan Blackwell's chronic pain status, as well as her outpatient medication regimen.  No change in medical history since last visit.  Patient's pain is at baseline.  Patient continues multimodal pain regimen as prescribed.  States that it provides pain relief and improvement in functional status.  The patient  reports no history of drug use. Her body mass index is 20.77 kg/m.  Further details on both, my assessment(s), as well as the proposed treatment plan, please see below.  Controlled Substance Pharmacotherapy Assessment REMS (Risk Evaluation and Mitigation Strategy)  Analgesic:Shatley, Gerline Legacy, RN  12/30/2018 12:19 PM  Sign when Signing Visit Safety precautions to be maintained throughout the outpatient stay will include: orient to surroundings, keep bed in low position, maintain call bell within reach at all times, provide assistance with transfer out of bed and ambulation.   Ignatius Specking, RN  12/25/2018  2:20 PM  Sign when Signing Visit Attempted  to call patient no answer, Left message on answering machine  So we can obtain some information for Tuesday visit.    12/08/2018  1   10/14/2018  Hydrocodone-Acetamin 5-325 MG  30.00 30 Bi Lat   [id number]   Gib (4800)   0  5.00 MME  Medicare   [place]     Pharmacokinetics: Liberation and absorption (onset of action): WNL Distribution (time to peak effect): WNL Metabolism and excretion (duration of action): WNL         Pharmacodynamics:  Desired effects: Analgesia: Susan Blackwell reports >50% benefit. Functional ability: Patient reports that medication allows her to accomplish basic ADLs Clinically meaningful improvement in function (CMIF): Sustained CMIF goals met Perceived effectiveness: Described as relatively  effective, allowing for increase in activities of daily living (ADL) Undesirable effects: Side-effects or Adverse reactions: None reported Monitoring: [place] PMP: PDMP not reviewed this encounter. Online review of the past 12-month period conducted. Compliant with practice rules and regulations Last UDS on record: No results found for: SUMMARY UDS interpretation: Compliant          Medication Assessment Form: Reviewed. Patient indicates being compliant with therapy Treatment compliance: Compliant Risk Assessment Profile: Aberrant behavior: See initial evaluations. None observed or detected today Comorbid factors increasing risk of overdose: See initial evaluation. No additional risks detected today Opioid risk tool (ORT):  No flowsheet data found.  ORT Scoring interpretation table:  Score <3 = Low Risk for SUD  Score between 4-7 = Moderate Risk for SUD  Score >8 = High Risk for Opioid Abuse   Risk of substance use disorder (SUD): Low  Risk Mitigation Strategies:  Patient Counseling: Covered Patient-Prescriber Agreement (PPA): Present and active  Notification to other healthcare providers: Done  Pharmacologic Plan: No change in therapy, at this time.             Laboratory Chemistry Profile     Renal Lab Results  Component Value Date   BUN 21 (H) 10/06/2016   CREATININE 1.20 (H) 10/06/2016   GFRAA 43 (L) 10/06/2016   GFRNONAA 37 (L) 10/06/2016                             Hepatic Lab Results  Component Value Date   AST 28 10/06/2016   ALT 22 10/06/2016   ALBUMIN 3.4 (L) 10/06/2016   ALKPHOS 73 10/06/2016   LIPASE 30 10/06/2016                        Electrolytes Lab Results  Component Value Date   NA 137 10/06/2016   K 3.6 10/06/2016   CL 102 10/06/2016   CALCIUM 9.0 10/06/2016                        Neuropathy No results found for: VITAMINB12, FOLATE, HGBA1C, HIV                      CNS No results found for: COLORCSF, APPEARCSF, RBCCOUNTCSF, WBCCSF,  POLYSCSF, LYMPHSCSF, EOSCSF, PROTEINCSF, GLUCCSF, JCVIRUS, CSFOLI, IGGCSF, LABACHR, ACETBL                      Bone No results found for: VD25OH, VD125OH2TOT, VD3125OH2, VD2125OH2, 25OHVITD1, 25OHVITD2, 25OHVITD3, TESTOFREE, TESTOSTERONE                       Coagulation Lab Results  Component Value Date   PLT 163 10/06/2016                        Cardiovascular Lab Results  Component Value Date   TROPONINI <0.03 03/10/2016   HGB 9.4 (L) 10/06/2016   HCT 28.2 (L) 10/06/2016                          Note: Lab results reviewed.  Recent Diagnostic Imaging   Results  CT Thoracic Spine Wo Contrast CLINICAL DATA:  Fall 2 days ago with back pain bilateral shoulder pain since a fall.  EXAM: CT THORACIC SPINE WITHOUT CONTRAST  TECHNIQUE: Multidetector CT images of the thoracic were obtained using the standard protocol without intravenous contrast.  COMPARISON:  None.  FINDINGS: Alignment: No traumatic malalignment. Mild degenerative thoracic dextrocurvature.  Vertebrae: Negative for vertebral fracture. The posterior left eighth rib is fractured on a chronic basis when compared with 09/30/2017 chest CT.  Paraspinal and other soft tissues: No acute finding in the lungs when allowing for motion artifact. There is trace pleural fluid or pleural thickening in the posterior left chest. Atherosclerosis.  Disc levels: Diffuse degenerative disc narrowing and endplate spurring  IMPRESSION: No acute finding.  Negative for thoracic spine fracture.  Electronically Signed   By: Monte Fantasia M.D.   On: 08/23/2018 12:23 DG Thoracic Spine 2 View CLINICAL DATA:  [place] 2 days ago.  Severe upper back pain.  EXAM: THORACIC SPINE 2 VIEWS  COMPARISON:  Chest CT 09/30/2017  FINDINGS: Mild S-shaped scoliosis in the spine. Dextroscoliosis of the thoracic spine with levoscoliosis of the upper lumbar spine. Vertebral body heights are maintained. No evidence for a  compression fracture. Atherosclerotic calcifications in the aorta.  IMPRESSION: 1. No acute abnormality in the thoracic spine. 2. Scoliosis involving the thoracic and lumbar spine.  Electronically Signed   By: Markus Daft M.D.   On: 08/23/2018 11:36 DG Shoulder Right CLINICAL DATA:  Status post fall, bilateral shoulder pain  EXAM: RIGHT SHOULDER - 2+ VIEW  COMPARISON:  Chest x-ray 03/14/2016  FINDINGS: Generalized osteopenia. No acute fracture or dislocation. Mild osteoarthritis of the glenohumeral joint. Mild arthropathy of the acromioclavicular joint. No aggressive osseous lesion.  IMPRESSION: No acute osseous injury of the right shoulder.  Electronically Signed   By: Kathreen Devoid   On: 08/23/2018 11:33 DG Shoulder Left CLINICAL DATA:  Status post fall, bilateral shoulder pain  EXAM: LEFT SHOULDER - 2+ VIEW  COMPARISON:  Chest x-ray 03/14/2016  FINDINGS: Generalized osteopenia. No acute fracture or dislocation. No aggressive osseous lesion. Mild osteoarthritis of the glenohumeral joint. Mild arthropathy of the acromioclavicular joint. Chondrocalcinosis of glenohumeral joint as can be seen with CPPD.  IMPRESSION: 1.  No acute osseous injury of the left shoulder. 2. Mild osteoarthritis of the left glenohumeral joint.  Electronically Signed   By: Kathreen Devoid   On: 08/23/2018 11:32 DG Chest 1 View CLINICAL DATA:  Status post fall, back pain, shoulder pain  EXAM: CHEST  1 VIEW  COMPARISON:  CT chest 09/30/2017  FINDINGS: There is bilateral chronic interstitial lung disease. There is no focal consolidation. There is no pleural effusion or pneumothorax. There is mild stable cardiomegaly. There is thoracic aortic atherosclerosis. There is an S-shaped curvature of the thoracolumbar spine.  There is no acute osseous abnormality.  IMPRESSION: No active disease.  Electronically Signed   By: Kathreen Devoid   On: 08/23/2018 11:30 CT Head Wo  Contrast CLINICAL DATA:  Fall 2 days prior. Base of neck, bilateral shoulder and upper back pain.  EXAM: CT HEAD WITHOUT CONTRAST  CT CERVICAL SPINE WITHOUT CONTRAST  TECHNIQUE: Multidetector CT imaging of the head and cervical spine was performed following the standard protocol without intravenous contrast. Multiplanar CT image reconstructions of the cervical spine were also generated.  COMPARISON:  10/06/2016 brain MRI.  FINDINGS: CT HEAD FINDINGS  Brain: No evidence of parenchymal hemorrhage or extra-axial fluid collection. No mass lesion, mass  effect, or midline shift. No CT evidence of acute infarction. Generalized cerebral volume loss. Nonspecific mild subcortical and periventricular white matter hypodensity, most in keeping with chronic small vessel ischemic change. No ventriculomegaly.  Vascular: No acute abnormality.  Skull: No evidence of calvarial fracture.  Sinuses/Orbits: The visualized paranasal sinuses are essentially clear.  Other:  The mastoid air cells are unopacified.  CT CERVICAL SPINE FINDINGS  Alignment: Straightening of the cervical spine. No facet subluxation. Dens is well positioned between the lateral masses of C1. Minimal 2 mm anterolisthesis at C3-4 and C5-6.  Skull base and vertebrae: Nondisplaced fracture of the tip of transverse right T1 process. No cervical spine fracture. No primary bone lesion or focal pathologic process.  Soft tissues and spinal canal: No prevertebral edema. No visible canal hematoma.  Disc levels: Marked multilevel degenerative disc disease throughout the cervical spine, most prominent at C6-7. Advanced bilateral facet arthropathy. Mild degenerative foraminal stenosis on the right at C4-5.  Upper chest: No acute abnormality.  Other: Visualized mastoid air cells appear clear. No discrete thyroid nodules. No pathologically enlarged cervical nodes.  IMPRESSION: 1. No evidence of acute intracranial  abnormality. No evidence of calvarial fracture. 2. Generalized cerebral volume loss and mild chronic small vessel ischemic changes in the cerebral white matter. 3. Nondisplaced fracture of the tip of the right T1 transverse process. No fracture within the cervical spine. No facet subluxation. 4. Marked multilevel degenerative changes in the cervical spine as detailed.  Electronically Signed   By: Ilona Sorrel M.D.   On: 08/23/2018 11:06 CT Cervical Spine Wo Contrast CLINICAL DATA:  Fall 2 days prior. Base of neck, bilateral shoulder and upper back pain.  EXAM: CT HEAD WITHOUT CONTRAST  CT CERVICAL SPINE WITHOUT CONTRAST  TECHNIQUE: Multidetector CT imaging of the head and cervical spine was performed following the standard protocol without intravenous contrast. Multiplanar CT image reconstructions of the cervical spine were also generated.  COMPARISON:  10/06/2016 brain MRI.  FINDINGS: CT HEAD FINDINGS  Brain: No evidence of parenchymal hemorrhage or extra-axial fluid collection. No mass lesion, mass effect, or midline shift. No CT evidence of acute infarction. Generalized cerebral volume loss. Nonspecific mild subcortical and periventricular white matter hypodensity, most in keeping with chronic small vessel ischemic change. No ventriculomegaly.  Vascular: No acute abnormality.  Skull: No evidence of calvarial fracture.  Sinuses/Orbits: The visualized paranasal sinuses are essentially clear.  Other:  The mastoid air cells are unopacified.  CT CERVICAL SPINE FINDINGS  Alignment: Straightening of the cervical spine. No facet subluxation. Dens is well positioned between the lateral masses of C1. Minimal 2 mm anterolisthesis at C3-4 and C5-6.  Skull base and vertebrae: Nondisplaced fracture of the tip of transverse right T1 process. No cervical spine fracture. No primary bone lesion or focal pathologic process.  Soft tissues and spinal canal: No prevertebral  edema. No visible canal hematoma.  Disc levels: Marked multilevel degenerative disc disease throughout the cervical spine, most prominent at C6-7. Advanced bilateral facet arthropathy. Mild degenerative foraminal stenosis on the right at C4-5.  Upper chest: No acute abnormality.  Other: Visualized mastoid air cells appear clear. No discrete thyroid nodules. No pathologically enlarged cervical nodes.  IMPRESSION: 1. No evidence of acute intracranial abnormality. No evidence of calvarial fracture. 2. Generalized cerebral volume loss and mild chronic small vessel ischemic changes in the cerebral white matter. 3. Nondisplaced fracture of the tip of the right T1 transverse process. No fracture within the cervical spine. No facet subluxation. 4. Marked  multilevel degenerative changes in the cervical spine as detailed.  Electronically Signed   By: Jason A Poff M.D.   On: 08/23/2018 11:06  Complexity Note: Imaging results reviewed. Results shared with Susan Blackwell, using Layman's terms.                               Meds   Current Outpatient Medications:  .  Acetaminophen (TYLENOL ARTHRITIS PAIN PO), Take by mouth daily., Disp: , Rfl:  .  aspirin EC 81 MG tablet, Take 81 mg by mouth daily., Disp: , Rfl:  .  Calcium Carbonate-Vit D-Min (CALTRATE 600+D PLUS MINERALS) 600-800 MG-UNIT TABS, Take 1 tablet by mouth daily., Disp: , Rfl:  .  Homeopathic Products ([facility]) GEL, Apply 1 application topically daily as needed (pain)., Disp: , Rfl:  .  hydrochlorothiazide (HYDRODIURIL) 25 MG tablet, Take 25 mg by mouth daily., Disp: , Rfl:  .  [START ON 01/07/2019] HYDROcodone-acetaminophen (NORCO) 5-325 MG tablet, Take 1 tablet by mouth daily as needed for moderate pain. For chronic pain.  To last 30 days., Disp: 30 tablet, Rfl: 0 .  [START ON 02/06/2019] HYDROcodone-acetaminophen (NORCO) 5-325 MG tablet, Take 1 tablet by mouth daily as needed for moderate pain. For chronic pain.  To last 30 days.,  Disp: 30 tablet, Rfl: 0 .  [START ON 03/08/2019] HYDROcodone-acetaminophen (NORCO) 5-325 MG tablet, Take 1 tablet by mouth daily as needed for moderate pain. For chronic pain.  To last 30 days., Disp: 30 tablet, Rfl: 0 .  Liniments (SALONPAS PAIN RELIEF PATCH EX), Apply 1 patch topically daily as needed (pain)., Disp: , Rfl:  .  losartan (COZAAR) 100 MG tablet, Take 100 mg by mouth daily., Disp: , Rfl:  .  magnesium hydroxide (MILK OF MAGNESIA) 400 MG/5ML suspension, Take by mouth daily as needed for mild constipation., Disp: , Rfl:  .  meclizine (ANTIVERT) 25 MG tablet, Take 1 tablet (25 mg total) by mouth 3 (three) times daily as needed for dizziness., Disp: 30 tablet, Rfl: 0 .  Menthol-Methyl Salicylate (THERA-GESIC PLUS) CREA, Apply 1 application topically daily as needed (pain). , Disp: , Rfl:  .  metoprolol (LOPRESSOR) 50 MG tablet, Take 50 mg by mouth 2 (two) times daily., Disp: , Rfl:  .  pantoprazole (PROTONIX) 40 MG tablet, Take 40 mg by mouth daily., Disp: , Rfl:  .  potassium chloride (K-DUR) 10 MEQ tablet, Take 10 mEq by mouth daily. , Disp: , Rfl:  .  Tetrahydrozoline HCl (VISINE OP), Apply 1 drop to eye daily as needed (dry eyes)., Disp: , Rfl:  .  vitamin B-12 (CYANOCOBALAMIN) 1000 MCG tablet, Take 1,000 mcg by mouth daily., Disp: , Rfl:  .  Cholecalciferol 2000 units TABS, Take 2,000 Units by mouth daily. , Disp: , Rfl:  .  dimenhyDRINATE (DRAMAMINE) 50 MG tablet, Take 50 mg by mouth every 8 (eight) hours as needed for dizziness. , Disp: , Rfl:  .  sodium chloride (OCEAN) 0.65 % SOLN nasal spray, Place 1 spray into both nostrils as needed for congestion., Disp: , Rfl:  .  traZODone (DESYREL) 50 MG tablet, Take 50 mg by mouth at bedtime as needed for sleep., Disp: , Rfl:   Current Facility-Administered Medications:  .  cefoTEtan (CEFOTAN) 1 g in dextrose 5 % 50 mL IVPB, 1 g, Intravenous, Once, Magod, Marc, MD  ROS  Constitutional: Denies any fever or chills Gastrointestinal: No  reported hemesis, hematochezia, vomiting, or acute   GI distress Musculoskeletal: Denies any acute onset joint swelling, redness, loss of ROM, or weakness Neurological: No reported episodes of acute onset apraxia, aphasia, dysarthria, agnosia, amnesia, paralysis, loss of coordination, or loss of consciousness  Allergies  Susan Blackwell is allergic to tramadol and nsaids.  PFSH  Drug: Susan Blackwell  reports no history of drug use. Alcohol:  reports no history of alcohol use. Tobacco:  reports that she has never smoked. She has never used smokeless tobacco. Medical:  has a past medical history of Bile duct stone (02/2016), Hypertension, and PONV (postoperative nausea and vomiting). Surgical: Susan Blackwell  has a past surgical history that includes Cholecystectomy; ERCP (N/A, 03/11/2016); Breast biopsy; ERCP (N/A, 06/18/2016); Gastrointestinal stent removal (N/A, 06/18/2016); Spyglass cholangioscopy (N/A, 06/18/2016); and spyglass lithotripsy (N/A, 06/18/2016). Family: family history is not on file.  Constitutional Exam  General appearance: Well nourished, well developed, and well hydrated. In no apparent acute distress Vitals:   12/30/18 1211  BP: (!) 194/67  Pulse: 75  Temp: 97.7 F (36.5 C)  TempSrc: Oral  SpO2: 99%  Weight: 96 lb (43.5 kg)  Height: 4' 9" (1.448 m)   BMI Assessment: Estimated body mass index is 20.77 kg/m as calculated from the following:   Height as of this encounter: 4' 9" (1.448 m).   Weight as of this encounter: 96 lb (43.5 kg).  BMI interpretation table: BMI level Category Range association with higher incidence of chronic pain  <18 kg/m2 Underweight   18.5-24.9 kg/m2 Ideal body weight   25-29.9 kg/m2 Overweight Increased incidence by 20%  30-34.9 kg/m2 Obese (Class I) Increased incidence by 68%  35-39.9 kg/m2 Severe obesity (Class II) Increased incidence by 136%  >40 kg/m2 Extreme obesity (Class III) Increased incidence by 254%   Patient's current BMI Ideal Body  weight  Body mass index is 20.77 kg/m. Female patients must weigh at least 45.5 kg to calculate ideal body weight   BMI Readings from Last 4 Encounters:  12/30/18 20.77 kg/m  09/24/18 20.77 kg/m  10/06/16 20.56 kg/m  06/18/16 20.56 kg/m   Wt Readings from Last 4 Encounters:  12/30/18 96 lb (43.5 kg)  09/24/18 96 lb (43.5 kg)  10/06/16 95 lb (43.1 kg)  06/18/16 95 lb (43.1 kg)  Psych/Mental status: Alert, oriented x 3 (person, place, & time)       Eyes: PERLA Respiratory: No evidence of acute respiratory distress   Thoracic Spine Area Exam  Skin & Axial Inspection: No masses, redness, or swelling Alignment: Symmetrical Functional ROM: Decreased ROM Stability: No instability detected Muscle Tone/Strength: Functionally intact. No obvious neuro-muscular anomalies detected. Sensory (Neurological): Unimpaired Muscle strength & Tone: No palpable anomalies  Lumbar Spine Area Exam  Skin & Axial Inspection: Thoraco-lumbar Scoliosis Alignment: Scoliosis detected Functional ROM: Decreased ROM       Stability: No instability detected Muscle Tone/Strength: Functionally intact. No obvious neuro-muscular anomalies detected. Sensory (Neurological): Musculoskeletal pain pattern Palpation: Complains of area being tender to palpation        *(Flexion, ABduction and External Rotation)  Gait & Posture Assessment  Ambulation: Patient came in today in a wheel chair Gait: Significantly limited. Dependent on assistive device to ambulate Posture: Difficulty standing up straight, due to pain   Lower Extremity Exam    Side: Right lower extremity  Side: Left lower extremity  Stability: No instability observed          Stability: No instability observed          Skin & Extremity Inspection: Skin   color, temperature, and hair growth are WNL. No peripheral edema or cyanosis. No masses, redness, swelling, asymmetry, or associated skin lesions. No contractures.  Skin & Extremity Inspection: Skin  color, temperature, and hair growth are WNL. No peripheral edema or cyanosis. No masses, redness, swelling, asymmetry, or associated skin lesions. No contractures.  Functional ROM: Pain restricted ROM for hip and knee joints          Functional ROM: Pain restricted ROM for hip and knee joints          Muscle Tone/Strength: Functionally intact. No obvious neuro-muscular anomalies detected.  Muscle Tone/Strength: Functionally intact. No obvious neuro-muscular anomalies detected.  Sensory (Neurological): Arthropathic arthralgia        Sensory (Neurological): Arthropathic arthralgia        DTR: Patellar: 0: absent Achilles: 0: absent Plantar: deferred today  DTR: Patellar: 0: absent Achilles: 0: absent Plantar: deferred today  Palpation: No palpable anomalies  Palpation: No palpable anomalies   Assessment   Status Diagnosis  Controlled Controlled Controlled 1. Primary osteoarthritis of left knee   2. Closed fracture of transverse process of thoracic vertebra, sequela   3. DDD (degenerative disc disease), cervical   4. Primary osteoarthritis of both shoulders   5. Encounter for long-term opiate analgesic use      Plan of Care  Pharmacotherapy (Medications Ordered): Meds ordered this encounter  Medications  . HYDROcodone-acetaminophen (NORCO) 5-325 MG tablet    Sig: Take 1 tablet by mouth daily as needed for moderate pain. For chronic pain.  To last 30 days.    Dispense:  30 tablet    Refill:  0  . HYDROcodone-acetaminophen (NORCO) 5-325 MG tablet    Sig: Take 1 tablet by mouth daily as needed for moderate pain. For chronic pain.  To last 30 days.    Dispense:  30 tablet    Refill:  0  . HYDROcodone-acetaminophen (NORCO) 5-325 MG tablet    Sig: Take 1 tablet by mouth daily as needed for moderate pain. For chronic pain.  To last 30 days.    Dispense:  30 tablet    Refill:  0   Continue APAP as prescribed.  Planned follow-up:   Return in about 3 months (around 03/31/2019) for  Medication Management, virtual.  Recent Visits Date Type Provider Dept  10/14/18 Office Visit Lateef, Bilal, MD [facility]  Showing recent visits within past 90 days and meeting all other requirements   Today's Visits Date Type Provider Dept  12/30/18 Office Visit Lateef, Bilal, MD [facility]  Showing today's visits and meeting all other requirements   Future Appointments No visits were found meeting these conditions.  Showing future appointments within next 90 days and meeting all other requirements   Primary Care Physician: Anderson, Marshall W, MD Location: [facility] Note by: Bilal Lateef, MD Date: 12/30/2018; Time: 12:32 PM  Note: This dictation was prepared with Dragon dictation. Any transcriptional errors that may result from this process are unintentional. 

## 2019-03-30 ENCOUNTER — Encounter: Payer: Self-pay | Admitting: Student in an Organized Health Care Education/Training Program

## 2019-03-31 ENCOUNTER — Telehealth: Payer: Self-pay | Admitting: Student in an Organized Health Care Education/Training Program

## 2019-03-31 ENCOUNTER — Ambulatory Visit
Payer: Medicare Other | Attending: Student in an Organized Health Care Education/Training Program | Admitting: Student in an Organized Health Care Education/Training Program

## 2019-03-31 ENCOUNTER — Other Ambulatory Visit: Payer: Self-pay

## 2019-03-31 ENCOUNTER — Encounter: Payer: Self-pay | Admitting: Student in an Organized Health Care Education/Training Program

## 2019-03-31 DIAGNOSIS — M5030 Other cervical disc degeneration, unspecified cervical region: Secondary | ICD-10-CM

## 2019-03-31 DIAGNOSIS — M5136 Other intervertebral disc degeneration, lumbar region: Secondary | ICD-10-CM

## 2019-03-31 DIAGNOSIS — Z79891 Long term (current) use of opiate analgesic: Secondary | ICD-10-CM

## 2019-03-31 DIAGNOSIS — M19012 Primary osteoarthritis, left shoulder: Secondary | ICD-10-CM

## 2019-03-31 DIAGNOSIS — G894 Chronic pain syndrome: Secondary | ICD-10-CM

## 2019-03-31 DIAGNOSIS — S22009S Unspecified fracture of unspecified thoracic vertebra, sequela: Secondary | ICD-10-CM | POA: Diagnosis not present

## 2019-03-31 DIAGNOSIS — M1712 Unilateral primary osteoarthritis, left knee: Secondary | ICD-10-CM

## 2019-03-31 DIAGNOSIS — M19011 Primary osteoarthritis, right shoulder: Secondary | ICD-10-CM

## 2019-03-31 MED ORDER — HYDROCODONE-ACETAMINOPHEN 5-325 MG PO TABS: 1.0000 | ORAL_TABLET | Freq: Every day | ORAL | 0 refills | Status: DC | PRN

## 2019-03-31 NOTE — Telephone Encounter (Signed)
Will you correct this please?

## 2019-03-31 NOTE — Addendum Note (Signed)
Addended by: Gillis Santa on: 03/31/2019 03:39 PM   Modules accepted: Orders

## 2019-03-31 NOTE — Progress Notes (Signed)
Pain Management Virtual Encounter Note - Virtual Visit via Video Conference Telehealth (real-time audio visits between healthcare provider and patient).   Patient's Phone No. & Preferred Pharmacy:  [phone] (home); [phone] (mobile); (Preferred) [phone] No e-mail address on record  [facility] [place] - [address] [place] [id number] Phone: [phone] Fax: [phone]    Pre-screening note:  Our staff contacted [name] and offered her an "in person", "face-to-face" appointment versus a telephone encounter. She indicated preferring the telephone encounter, at this time.   Reason for Virtual Visit: COVID-19*  Social distancing based on CDC and AMA recommendations.   I contacted Susan LloydDorothy Blackwell on 03/31/2019 via video conference.      I clearly identified myself as Edward JollyBilal Lachlyn Vanderstelt, MD. I verified that I was speaking with the correct person using two identifiers (Name: Susan Blackwell, and date of birth: 1919-09-11).  Advanced Informed Consent I sought verbal advanced consent from Susan Blackwell for virtual visit interactions. I informed Ms. Blackwell of possible security and privacy concerns, risks, and limitations associated with providing "not-in-person" medical evaluation and management services. I also informed Ms. Blackwell of the availability of "in-person" appointments. Finally, I informed her that there would be a charge for the virtual visit and that she could be  personally, fully or partially, financially responsible for it. Susan Blackwell expressed understanding and agreed to proceed.   Historic Elements   Susan Blackwell is a 85 y.o. year old, female patient evaluated today after her last encounter by our practice on 12/30/2018. Susan Blackwell  has a past medical history of Bile duct stone (02/2016), Hypertension, and PONV (postoperative nausea and vomiting). She also  has a past surgical history that includes Cholecystectomy; ERCP  (N/A, 03/11/2016); Breast biopsy; ERCP (N/A, 06/18/2016); Gastrointestinal stent removal (N/A, 06/18/2016); Spyglass cholangioscopy (N/A, 06/18/2016); and spyglass lithotripsy (N/A, 06/18/2016). Susan Blackwell has a current medication list which includes the following prescription(s): acetaminophen, aspirin ec, caltrate 600+d plus minerals, cholecalciferol, arnicare, hydrochlorothiazide, hydrocodone-acetaminophen, hydrocodone-acetaminophen, hydrocodone-acetaminophen, menthol-methyl salicylate, losartan, magnesium hydroxide, meclizine, thera-gesic plus, metoprolol tartrate, pantoprazole, potassium chloride, tetrahydrozoline hcl, vitamin b-12, dimenhydrinate, sodium chloride, and trazodone, and the following Facility-Administered Medications: cefoTEtan (CEFOTAN) 1 g in dextrose 5 % 50 mL IVPB. She  reports that she has never smoked. She has never used smokeless tobacco. She reports that she does not drink alcohol or use drugs. Susan Blackwell is allergic to tramadol and nsaids.   HPI  Today, she is being contacted for medication management.   No change in medical history since last visit.  Patient's pain is at baseline.  Patient continues multimodal pain regimen as prescribed.  States that it provides pain relief and improvement in functional status.   Pharmacotherapy Assessment  Analgesic: Hydrocodone 2.5 - 5 mg daily prn pain, #30/month  Monitoring: Pharmacotherapy: No side-effects or adverse reactions reported. [place] PMP: PDMP reviewed during this encounter.       Compliance: No problems identified. Effectiveness: Clinically acceptable. Plan: Refer to "POC".  UDS: No results found for: SUMMARY Laboratory Chemistry Profile (12 mo)  Renal: No results found for requested labs within last 8760 hours.  Lab Results  Component Value Date   GFRAA 43 (L) 10/06/2016   GFRNONAA 37 (L) 10/06/2016   Hepatic: No results found for requested labs within last 8760 hours. Lab Results  Component Value Date   AST 28  10/06/2016   ALT 22 10/06/2016   Other: No results found for requested labs within last 8760 hours. Note: Above  Lab results reviewed.  Imaging  CT Thoracic Spine Wo Contrast CLINICAL DATA:  Fall 2 days ago with back pain bilateral shoulder pain since a fall.  EXAM: CT THORACIC SPINE WITHOUT CONTRAST  TECHNIQUE: Multidetector CT images of the thoracic were obtained using the standard protocol without intravenous contrast.  COMPARISON:  None.  FINDINGS: Alignment: No traumatic malalignment. Mild degenerative thoracic dextrocurvature.  Vertebrae: Negative for vertebral fracture. The posterior left eighth rib is fractured on a chronic basis when compared with 09/30/2017 chest CT.  Paraspinal and other soft tissues: No acute finding in the lungs when allowing for motion artifact. There is trace pleural fluid or pleural thickening in the posterior left chest. Atherosclerosis.  Disc levels: Diffuse degenerative disc narrowing and endplate spurring  IMPRESSION: No acute finding.  Negative for thoracic spine fracture.  Electronically Signed   By: Marnee Spring M.D.   On: 08/23/2018 12:23 DG Thoracic Spine 2 View CLINICAL DATA:  Larey Seat 2 days ago.  Severe upper back pain.  EXAM: THORACIC SPINE 2 VIEWS  COMPARISON:  Chest CT 09/30/2017  FINDINGS: Mild S-shaped scoliosis in the spine. Dextroscoliosis of the thoracic spine with levoscoliosis of the upper lumbar spine. Vertebral body heights are maintained. No evidence for a compression fracture. Atherosclerotic calcifications in the aorta.  IMPRESSION: 1. No acute abnormality in the thoracic spine. 2. Scoliosis involving the thoracic and lumbar spine.  Electronically Signed   By: Richarda Overlie M.D.   On: 08/23/2018 11:36 DG Shoulder Right CLINICAL DATA:  Status post fall, bilateral shoulder pain  EXAM: RIGHT SHOULDER - 2+ VIEW  COMPARISON:  Chest x-ray 03/14/2016  FINDINGS: Generalized osteopenia. No acute  fracture or dislocation. Mild osteoarthritis of the glenohumeral joint. Mild arthropathy of the acromioclavicular joint. No aggressive osseous lesion.  IMPRESSION: No acute osseous injury of the right shoulder.  Electronically Signed   By: Elige Ko   On: 08/23/2018 11:33 DG Shoulder Left CLINICAL DATA:  Status post fall, bilateral shoulder pain  EXAM: LEFT SHOULDER - 2+ VIEW  COMPARISON:  Chest x-ray 03/14/2016  FINDINGS: Generalized osteopenia. No acute fracture or dislocation. No aggressive osseous lesion. Mild osteoarthritis of the glenohumeral joint. Mild arthropathy of the acromioclavicular joint. Chondrocalcinosis of glenohumeral joint as can be seen with CPPD.  IMPRESSION: 1.  No acute osseous injury of the left shoulder. 2. Mild osteoarthritis of the left glenohumeral joint.  Electronically Signed   By: Elige Ko   On: 08/23/2018 11:32 DG Chest 1 View CLINICAL DATA:  Status post fall, back pain, shoulder pain  EXAM: CHEST  1 VIEW  COMPARISON:  CT chest 09/30/2017  FINDINGS: There is bilateral chronic interstitial lung disease. There is no focal consolidation. There is no pleural effusion or pneumothorax. There is mild stable cardiomegaly. There is thoracic aortic atherosclerosis. There is an S-shaped curvature of the thoracolumbar spine.  There is no acute osseous abnormality.  IMPRESSION: No active disease.  Electronically Signed   By: Elige Ko   On: 08/23/2018 11:30 CT Head Wo Contrast CLINICAL DATA:  Fall 2 days prior. Base of neck, bilateral shoulder and upper back pain.  EXAM: CT HEAD WITHOUT CONTRAST  CT CERVICAL SPINE WITHOUT CONTRAST  TECHNIQUE: Multidetector CT imaging of the head and cervical spine was performed following the standard protocol without intravenous contrast. Multiplanar CT image reconstructions of the cervical spine were also generated.  COMPARISON:  10/06/2016 brain MRI.  FINDINGS: CT HEAD  FINDINGS  Brain: No evidence of parenchymal hemorrhage or extra-axial fluid collection.  No mass lesion, mass effect, or midline shift. No CT evidence of acute infarction. Generalized cerebral volume loss. Nonspecific mild subcortical and periventricular white matter hypodensity, most in keeping with chronic small vessel ischemic change. No ventriculomegaly.  Vascular: No acute abnormality.  Skull: No evidence of calvarial fracture.  Sinuses/Orbits: The visualized paranasal sinuses are essentially clear.  Other:  The mastoid air cells are unopacified.  CT CERVICAL SPINE FINDINGS  Alignment: Straightening of the cervical spine. No facet subluxation. Dens is well positioned between the lateral masses of C1. Minimal 2 mm anterolisthesis at C3-4 and C5-6.  Skull base and vertebrae: Nondisplaced fracture of the tip of transverse right T1 process. No cervical spine fracture. No primary bone lesion or focal pathologic process.  Soft tissues and spinal canal: No prevertebral edema. No visible canal hematoma.  Disc levels: Marked multilevel degenerative disc disease throughout the cervical spine, most prominent at C6-7. Advanced bilateral facet arthropathy. Mild degenerative foraminal stenosis on the right at C4-5.  Upper chest: No acute abnormality.  Other: Visualized mastoid air cells appear clear. No discrete thyroid nodules. No pathologically enlarged cervical nodes.  IMPRESSION: 1. No evidence of acute intracranial abnormality. No evidence of calvarial fracture. 2. Generalized cerebral volume loss and mild chronic small vessel ischemic changes in the cerebral white matter. 3. Nondisplaced fracture of the tip of the right T1 transverse process. No fracture within the cervical spine. No facet subluxation. 4. Marked multilevel degenerative changes in the cervical spine as detailed.  Electronically Signed   By: Delbert Phenix M.D.   On: 08/23/2018 11:06 CT Cervical Spine  Wo Contrast CLINICAL DATA:  Fall 2 days prior. Base of neck, bilateral shoulder and upper back pain.  EXAM: CT HEAD WITHOUT CONTRAST  CT CERVICAL SPINE WITHOUT CONTRAST  TECHNIQUE: Multidetector CT imaging of the head and cervical spine was performed following the standard protocol without intravenous contrast. Multiplanar CT image reconstructions of the cervical spine were also generated.  COMPARISON:  10/06/2016 brain MRI.  FINDINGS: CT HEAD FINDINGS  Brain: No evidence of parenchymal hemorrhage or extra-axial fluid collection. No mass lesion, mass effect, or midline shift. No CT evidence of acute infarction. Generalized cerebral volume loss. Nonspecific mild subcortical and periventricular white matter hypodensity, most in keeping with chronic small vessel ischemic change. No ventriculomegaly.  Vascular: No acute abnormality.  Skull: No evidence of calvarial fracture.  Sinuses/Orbits: The visualized paranasal sinuses are essentially clear.  Other:  The mastoid air cells are unopacified.  CT CERVICAL SPINE FINDINGS  Alignment: Straightening of the cervical spine. No facet subluxation. Dens is well positioned between the lateral masses of C1. Minimal 2 mm anterolisthesis at C3-4 and C5-6.  Skull base and vertebrae: Nondisplaced fracture of the tip of transverse right T1 process. No cervical spine fracture. No primary bone lesion or focal pathologic process.  Soft tissues and spinal canal: No prevertebral edema. No visible canal hematoma.  Disc levels: Marked multilevel degenerative disc disease throughout the cervical spine, most prominent at C6-7. Advanced bilateral facet arthropathy. Mild degenerative foraminal stenosis on the right at C4-5.  Upper chest: No acute abnormality.  Other: Visualized mastoid air cells appear clear. No discrete thyroid nodules. No pathologically enlarged cervical nodes.  IMPRESSION: 1. No evidence of acute intracranial  abnormality. No evidence of calvarial fracture. 2. Generalized cerebral volume loss and mild chronic small vessel ischemic changes in the cerebral white matter. 3. Nondisplaced fracture of the tip of the right T1 transverse process. No fracture within the cervical spine. No  facet subluxation. 4. Marked multilevel degenerative changes in the cervical spine as detailed.  Electronically Signed   By: Ilona Sorrel M.D.   On: 08/23/2018 11:06   Assessment  The primary encounter diagnosis was Primary osteoarthritis of left knee. Diagnoses of Closed fracture of transverse process of thoracic vertebra, sequela, DDD (degenerative disc disease), cervical, Encounter for long-term opiate analgesic use, Primary osteoarthritis of both shoulders, Lumbar degenerative disc disease, and Chronic pain syndrome were also pertinent to this visit.  Plan of Care   I am having Susan Blackwell start on HYDROcodone-acetaminophen and HYDROcodone-acetaminophen. I am also having her maintain her potassium chloride, metoprolol tartrate, dimenhyDRINATE, Caltrate 600+D Plus Minerals, vitamin B-12, Cholecalciferol, Thera-Gesic Plus, aspirin EC, Liniments (SALONPAS PAIN RELIEF PATCH EX), Arnicare, traZODone, hydrochlorothiazide, Tetrahydrozoline HCl (VISINE OP), sodium chloride, meclizine, losartan, pantoprazole, magnesium hydroxide, Acetaminophen (TYLENOL ARTHRITIS PAIN PO), and HYDROcodone-acetaminophen. We will continue to administer cefoTEtan (CEFOTAN) 1 g in dextrose 5 % 50 mL IVPB.  Pharmacotherapy (Medications Ordered): Meds ordered this encounter  Medications  . HYDROcodone-acetaminophen (NORCO) 5-325 MG tablet    Sig: Take 1 tablet by mouth daily as needed for moderate pain. For chronic pain.  To last 30 days.    Dispense:  30 tablet    Refill:  0  . HYDROcodone-acetaminophen (NORCO) 5-325 MG tablet    Sig: Take 1 tablet by mouth daily as needed for moderate pain. For chronic pain.  To last 30 days.    Dispense:   30 tablet    Refill:  0  . HYDROcodone-acetaminophen (NORCO) 5-325 MG tablet    Sig: Take 1 tablet by mouth daily as needed for moderate pain. For chronic pain.  To last 30 days.    Dispense:  30 tablet    Refill:  0   Follow-up plan:   Return in about 3 months (around 06/29/2019) for Medication Management, virtual.    Recent Visits No visits were found meeting these conditions.  Showing recent visits within past 90 days and meeting all other requirements   Today's Visits Date Type Provider Dept  03/31/19 Office Visit Gillis Santa, MD [facility]  Showing today's visits and meeting all other requirements   Future Appointments No visits were found meeting these conditions.  Showing future appointments within next 90 days and meeting all other requirements   I discussed the assessment and treatment plan with the patient. The patient was provided an opportunity to ask questions and all were answered. The patient agreed with the plan and demonstrated an understanding of the instructions.  Patient advised to call back or seek an in-person evaluation if the symptoms or condition worsens.  Total duration of non-face-to-face encounter: 25 minutes.  Note by: Gillis Santa, MD Date: 03/31/2019; Time: 12:01 PM  Note: This dictation was prepared with Dragon dictation. Any transcriptional errors that may result from this process are unintentional.  Disclaimer:  * Given the special circumstances of the COVID-19 pandemic, the federal government has announced that the Office for Civil Rights (OCR) will exercise its enforcement discretion and will not impose penalties on physicians using telehealth in the event of noncompliance with regulatory requirements under the [place] (HIPAA) in connection with the good faith provision of telehealth during the RXVQM-08 national public health emergency. ([place])

## 2019-03-31 NOTE — Telephone Encounter (Signed)
Lovena Le at [facility] called stating the patients script for December is dated for 2021. The other 2 scripts are dated for 2022.  Please check and contact Dr. Holley Raring so he can correct.

## 2019-04-01 ENCOUNTER — Encounter: Payer: Self-pay | Admitting: Student in an Organized Health Care Education/Training Program

## 2019-04-01 MED ORDER — HYDROCODONE-ACETAMINOPHEN 5-325 MG PO TABS: 1.0000 | ORAL_TABLET | Freq: Every day | ORAL | 0 refills | Status: DC | PRN

## 2019-04-01 NOTE — Progress Notes (Signed)
Cancelled 2022 scripts

## 2019-04-01 NOTE — Progress Notes (Signed)
The other 2 still are dated to be filled in 2022. Did you mean that?

## 2019-04-01 NOTE — Addendum Note (Signed)
Addended by: Gillis Santa on: 04/01/2019 09:07 AM   Modules accepted: Orders

## 2019-05-07 ENCOUNTER — Telehealth: Payer: Self-pay | Admitting: Student in an Organized Health Care Education/Training Program

## 2019-05-07 ENCOUNTER — Other Ambulatory Visit: Payer: Self-pay | Admitting: Student in an Organized Health Care Education/Training Program

## 2019-05-07 DIAGNOSIS — M5136 Other intervertebral disc degeneration, lumbar region: Secondary | ICD-10-CM

## 2019-05-07 DIAGNOSIS — S22009S Unspecified fracture of unspecified thoracic vertebra, sequela: Secondary | ICD-10-CM

## 2019-05-07 DIAGNOSIS — G894 Chronic pain syndrome: Secondary | ICD-10-CM

## 2019-05-07 MED ORDER — HYDROCODONE-ACETAMINOPHEN 5-325 MG PO TABS: 1.0000 | ORAL_TABLET | Freq: Every day | ORAL | 0 refills | Status: DC | PRN

## 2019-05-07 NOTE — Progress Notes (Unsigned)
Requested Prescriptions   Signed Prescriptions Disp Refills  . HYDROcodone-acetaminophen (NORCO) 5-325 MG tablet 30 tablet 0    Sig: Take 1 tablet by mouth daily as needed for moderate pain. For chronic pain.  To last 30 days.  Marland Kitchen HYDROcodone-acetaminophen (NORCO) 5-325 MG tablet 30 tablet 0    Sig: Take 1 tablet by mouth daily as needed for moderate pain. For chronic pain.  To last 30 days.    Per pharmacy they did not receive Jan or Feb rx. Resent as above. PMP checked. 04/07/2019  1   03/31/2019  Hydrocodone-Acetamin 5-325 MG  30.00  30 Bi Lat   [id number]   [place] ([id number])   0  5.00 MME  Medicare   [place]

## 2019-05-07 NOTE — Telephone Encounter (Addendum)
Start date on jan script of Hydrocodone for this patient is 05-06-2020, please correct and notify pharmacy so they can fill for patient

## 2019-06-24 ENCOUNTER — Encounter: Payer: Self-pay | Admitting: Student in an Organized Health Care Education/Training Program

## 2019-06-25 ENCOUNTER — Ambulatory Visit
Payer: Medicare Other | Attending: Student in an Organized Health Care Education/Training Program | Admitting: Student in an Organized Health Care Education/Training Program

## 2019-06-25 ENCOUNTER — Other Ambulatory Visit: Payer: Self-pay

## 2019-06-25 DIAGNOSIS — G894 Chronic pain syndrome: Secondary | ICD-10-CM | POA: Diagnosis not present

## 2019-06-25 DIAGNOSIS — M5136 Other intervertebral disc degeneration, lumbar region: Secondary | ICD-10-CM

## 2019-06-25 DIAGNOSIS — S22009S Unspecified fracture of unspecified thoracic vertebra, sequela: Secondary | ICD-10-CM | POA: Diagnosis not present

## 2019-06-25 MED ORDER — HYDROCODONE-ACETAMINOPHEN 5-325 MG PO TABS: 1.0000 | ORAL_TABLET | Freq: Every day | ORAL | 0 refills | Status: DC | PRN

## 2019-06-25 NOTE — Progress Notes (Signed)
Patient: Susan Blackwell  Service Category: E/M  Provider: Gillis Santa, MD  DOB: Dec 28, 1919  DOS: 06/25/2019  Location: Office  MRN: [id number]  Setting: Ambulatory outpatient  Referring Provider: Kirk Ruths, MD  Type: Established Patient  Specialty: Interventional Pain Management  PCP: Kirk Ruths, MD  Location: Home  Delivery: TeleHealth     Virtual Encounter - Pain Management PROVIDER NOTE: Information contained herein reflects review and annotations entered in association with encounter. Interpretation of such information and data should be left to medically-trained personnel. Information provided to patient can be located elsewhere in the medical record under "Patient Instructions". Document created using STT-dictation technology, any transcriptional errors that may result from process are unintentional.    Contact & Pharmacy Preferred: [place]: [phone] (home) Mobile: [phone] (mobile) E-mail: No e-mail address on record  [facility] [place] [facility] [place] [address] [place] [address] Phone: [phone] Fax: [phone]   Pre-screening  Susan Blackwell offered "in-person" vs "virtual" encounter. She indicated preferring virtual for this encounter.   Reason COVID-19*  Social distancing based on CDC and AMA recommendations.   I contacted Susan Blackwell on 06/25/2019 via telephone.      I clearly identified myself as Gillis Santa, MD. I verified that I was speaking with the correct person using two identifiers (Name: Laylanie Kruczek, and date of birth: 09-27-1919).  Consent I sought verbal advanced consent from Susan Blackwell for virtual visit interactions. I informed Susan Blackwell of possible security and privacy concerns, risks, and limitations associated with providing "not-in-person" medical evaluation and management services. I also informed Susan Blackwell of the availability of "in-person" appointments. Finally, I informed  her that there would be a charge for the virtual visit and that she could be  personally, fully or partially, financially responsible for it. Susan Blackwell expressed understanding and agreed to proceed.   Historic Elements   Susan Blackwell is a 86 y.o. year old, female patient evaluated today after her last contact with our practice on 05/07/2019. Ms. Jamie  has a past medical history of Bile duct stone (02/2016), Hypertension, and PONV (postoperative nausea and vomiting). She also  has a past surgical history that includes Cholecystectomy; ERCP (N/A, 03/11/2016); Breast biopsy; ERCP (N/A, 06/18/2016); Gastrointestinal stent removal (N/A, 06/18/2016); Spyglass cholangioscopy (N/A, 06/18/2016); and spyglass lithotripsy (N/A, 06/18/2016). Susan Blackwell has a current medication list which includes the following prescription(s): acetaminophen, aspirin ec, caltrate 600+d plus minerals, cholecalciferol, dimenhydrinate, arnicare, hydrochlorothiazide, menthol-methyl salicylate, losartan, magnesium hydroxide, meclizine, thera-gesic plus, metoprolol tartrate, pantoprazole, potassium chloride, sodium chloride, tetrahydrozoline hcl, trazodone, vitamin b-12, [START ON 07/05/2019] hydrocodone-acetaminophen, and [START ON 08/04/2019] hydrocodone-acetaminophen, and the following Facility-Administered Medications: cefoTEtan (CEFOTAN) 1 g in dextrose 5 % 50 mL IVPB. She  reports that she has never smoked. She has never used smokeless tobacco. She reports that she does not drink alcohol or use drugs. Susan Blackwell is allergic to tramadol and nsaids.   HPI  Today, she is being contacted for medication management.  No change in medical history since last visit.  Patient's pain is at baseline.  Patient continues multimodal pain regimen as prescribed.  States that it provides pain relief and improvement in functional status.   Pharmacotherapy Assessment  Analgesic:  06/06/2019  1   03/31/2019  Hydrocodone-Acetamin 5-325 MG  30.00  30  Bi Lat   [id number]   Gib (4800)   0  5.00 MME  Private Pay   [place]    Monitoring: [place] PMP: PDMP reviewed  during this encounter.       Pharmacotherapy: No side-effects or adverse reactions reported. Compliance: No problems identified. Effectiveness: Clinically acceptable. Plan: Refer to "POC".  UDS: No results found for: SUMMARY Laboratory Chemistry Profile   Renal Lab Results  Component Value Date   BUN 21 (H) 10/06/2016   CREATININE 1.20 (H) 10/06/2016   GFRAA 43 (L) 10/06/2016   GFRNONAA 37 (L) 10/06/2016    Hepatic Lab Results  Component Value Date   AST 28 10/06/2016   ALT 22 10/06/2016   ALBUMIN 3.4 (L) 10/06/2016   ALKPHOS 73 10/06/2016   LIPASE 30 10/06/2016    Electrolytes Lab Results  Component Value Date   NA 137 10/06/2016   K 3.6 10/06/2016   CL 102 10/06/2016   CALCIUM 9.0 10/06/2016    Bone No results found for: VD25OH, VD125OH2TOT, GG8366QH4, TM5465KP5, 25OHVITD1, 25OHVITD2, 25OHVITD3, TESTOFREE, TESTOSTERONE  Inflammation (CRP: Acute Phase) (ESR: Chronic Phase) No results found for: CRP, ESRSEDRATE, LATICACIDVEN    Note: Above Lab results reviewed.  Imaging  CT Thoracic Spine Wo Contrast CLINICAL DATA:  Fall 2 days ago with back pain bilateral shoulder pain since a fall.  EXAM: CT THORACIC SPINE WITHOUT CONTRAST  TECHNIQUE: Multidetector CT images of the thoracic were obtained using the standard protocol without intravenous contrast.  COMPARISON:  None.  FINDINGS: Alignment: No traumatic malalignment. Mild degenerative thoracic dextrocurvature.  Vertebrae: Negative for vertebral fracture. The posterior left eighth rib is fractured on a chronic basis when compared with 09/30/2017 chest CT.  Paraspinal and other soft tissues: No acute finding in the lungs when allowing for motion artifact. There is trace pleural fluid or pleural thickening in the posterior left chest. Atherosclerosis.  Disc levels: Diffuse degenerative disc narrowing and  endplate spurring  IMPRESSION: No acute finding.  Negative for thoracic spine fracture.  Electronically Signed   By: Monte Fantasia M.D.   On: 08/23/2018 12:23 DG Thoracic Spine 2 View CLINICAL DATA:  [place] 2 days ago.  Severe upper back pain.  EXAM: THORACIC SPINE 2 VIEWS  COMPARISON:  Chest CT 09/30/2017  FINDINGS: Mild S-shaped scoliosis in the spine. Dextroscoliosis of the thoracic spine with levoscoliosis of the upper lumbar spine. Vertebral body heights are maintained. No evidence for a compression fracture. Atherosclerotic calcifications in the aorta.  IMPRESSION: 1. No acute abnormality in the thoracic spine. 2. Scoliosis involving the thoracic and lumbar spine.  Electronically Signed   By: Markus Daft M.D.   On: 08/23/2018 11:36 DG Shoulder Right CLINICAL DATA:  Status post fall, bilateral shoulder pain  EXAM: RIGHT SHOULDER - 2+ VIEW  COMPARISON:  Chest x-ray 03/14/2016  FINDINGS: Generalized osteopenia. No acute fracture or dislocation. Mild osteoarthritis of the glenohumeral joint. Mild arthropathy of the acromioclavicular joint. No aggressive osseous lesion.  IMPRESSION: No acute osseous injury of the right shoulder.  Electronically Signed   By: Kathreen Devoid   On: 08/23/2018 11:33 DG Shoulder Left CLINICAL DATA:  Status post fall, bilateral shoulder pain  EXAM: LEFT SHOULDER - 2+ VIEW  COMPARISON:  Chest x-ray 03/14/2016  FINDINGS: Generalized osteopenia. No acute fracture or dislocation. No aggressive osseous lesion. Mild osteoarthritis of the glenohumeral joint. Mild arthropathy of the acromioclavicular joint. Chondrocalcinosis of glenohumeral joint as can be seen with CPPD.  IMPRESSION: 1.  No acute osseous injury of the left shoulder. 2. Mild osteoarthritis of the left glenohumeral joint.  Electronically Signed   By: Kathreen Devoid   On: 08/23/2018 11:32 DG Chest 1 View CLINICAL  DATA:  Status post fall, back pain, shoulder  pain  EXAM: CHEST  1 VIEW  COMPARISON:  CT chest 09/30/2017  FINDINGS: There is bilateral chronic interstitial lung disease. There is no focal consolidation. There is no pleural effusion or pneumothorax. There is mild stable cardiomegaly. There is thoracic aortic atherosclerosis. There is an S-shaped curvature of the thoracolumbar spine.  There is no acute osseous abnormality.  IMPRESSION: No active disease.  Electronically Signed   By: Kathreen Devoid   On: 08/23/2018 11:30 CT Head Wo Contrast CLINICAL DATA:  Fall 2 days prior. Base of neck, bilateral shoulder and upper back pain.  EXAM: CT HEAD WITHOUT CONTRAST  CT CERVICAL SPINE WITHOUT CONTRAST  TECHNIQUE: Multidetector CT imaging of the head and cervical spine was performed following the standard protocol without intravenous contrast. Multiplanar CT image reconstructions of the cervical spine were also generated.  COMPARISON:  10/06/2016 brain MRI.  FINDINGS: CT HEAD FINDINGS  Brain: No evidence of parenchymal hemorrhage or extra-axial fluid collection. No mass lesion, mass effect, or midline shift. No CT evidence of acute infarction. Generalized cerebral volume loss. Nonspecific mild subcortical and periventricular white matter hypodensity, most in keeping with chronic small vessel ischemic change. No ventriculomegaly.  Vascular: No acute abnormality.  Skull: No evidence of calvarial fracture.  Sinuses/Orbits: The visualized paranasal sinuses are essentially clear.  Other:  The mastoid air cells are unopacified.  CT CERVICAL SPINE FINDINGS  Alignment: Straightening of the cervical spine. No facet subluxation. Dens is well positioned between the lateral masses of C1. Minimal 2 mm anterolisthesis at C3-4 and C5-6.  Skull base and vertebrae: Nondisplaced fracture of the tip of transverse right T1 process. No cervical spine fracture. No primary bone lesion or focal pathologic process.  Soft tissues  and spinal canal: No prevertebral edema. No visible canal hematoma.  Disc levels: Marked multilevel degenerative disc disease throughout the cervical spine, most prominent at C6-7. Advanced bilateral facet arthropathy. Mild degenerative foraminal stenosis on the right at C4-5.  Upper chest: No acute abnormality.  Other: Visualized mastoid air cells appear clear. No discrete thyroid nodules. No pathologically enlarged cervical nodes.  IMPRESSION: 1. No evidence of acute intracranial abnormality. No evidence of calvarial fracture. 2. Generalized cerebral volume loss and mild chronic small vessel ischemic changes in the cerebral white matter. 3. Nondisplaced fracture of the tip of the right T1 transverse process. No fracture within the cervical spine. No facet subluxation. 4. Marked multilevel degenerative changes in the cervical spine as detailed.  Electronically Signed   By: Ilona Sorrel M.D.   On: 08/23/2018 11:06 CT Cervical Spine Wo Contrast CLINICAL DATA:  Fall 2 days prior. Base of neck, bilateral shoulder and upper back pain.  EXAM: CT HEAD WITHOUT CONTRAST  CT CERVICAL SPINE WITHOUT CONTRAST  TECHNIQUE: Multidetector CT imaging of the head and cervical spine was performed following the standard protocol without intravenous contrast. Multiplanar CT image reconstructions of the cervical spine were also generated.  COMPARISON:  10/06/2016 brain MRI.  FINDINGS: CT HEAD FINDINGS  Brain: No evidence of parenchymal hemorrhage or extra-axial fluid collection. No mass lesion, mass effect, or midline shift. No CT evidence of acute infarction. Generalized cerebral volume loss. Nonspecific mild subcortical and periventricular white matter hypodensity, most in keeping with chronic small vessel ischemic change. No ventriculomegaly.  Vascular: No acute abnormality.  Skull: No evidence of calvarial fracture.  Sinuses/Orbits: The visualized paranasal sinuses are  essentially clear.  Other:  The mastoid air cells are unopacified.  CT  CERVICAL SPINE FINDINGS  Alignment: Straightening of the cervical spine. No facet subluxation. Dens is well positioned between the lateral masses of C1. Minimal 2 mm anterolisthesis at C3-4 and C5-6.  Skull base and vertebrae: Nondisplaced fracture of the tip of transverse right T1 process. No cervical spine fracture. No primary bone lesion or focal pathologic process.  Soft tissues and spinal canal: No prevertebral edema. No visible canal hematoma.  Disc levels: Marked multilevel degenerative disc disease throughout the cervical spine, most prominent at C6-7. Advanced bilateral facet arthropathy. Mild degenerative foraminal stenosis on the right at C4-5.  Upper chest: No acute abnormality.  Other: Visualized mastoid air cells appear clear. No discrete thyroid nodules. No pathologically enlarged cervical nodes.  IMPRESSION: 1. No evidence of acute intracranial abnormality. No evidence of calvarial fracture. 2. Generalized cerebral volume loss and mild chronic small vessel ischemic changes in the cerebral white matter. 3. Nondisplaced fracture of the tip of the right T1 transverse process. No fracture within the cervical spine. No facet subluxation. 4. Marked multilevel degenerative changes in the cervical spine as detailed.  Electronically Signed   By: Ilona Sorrel M.D.   On: 08/23/2018 11:06  Assessment  Diagnoses of Closed fracture of transverse process of thoracic vertebra, sequela, Lumbar degenerative disc disease, and Chronic pain syndrome were pertinent to this visit.  Plan of Care  Susan Blackwell has a current medication list which includes the following long-term medication(s): caltrate 600+d plus minerals, losartan, metoprolol tartrate, pantoprazole, potassium chloride, sodium chloride, and trazodone.  Pharmacotherapy (Medications Ordered): Meds ordered this encounter  Medications   . HYDROcodone-acetaminophen (NORCO) 5-325 MG tablet    Sig: Take 1 tablet by mouth daily as needed for moderate pain. For chronic pain.  To last 30 days.    Dispense:  30 tablet    Refill:  0  . HYDROcodone-acetaminophen (NORCO) 5-325 MG tablet    Sig: Take 1 tablet by mouth daily as needed for moderate pain. For chronic pain.  To last 30 days.    Dispense:  30 tablet    Refill:  0   Follow-up plan:   Return in about 8 weeks (around 08/20/2019).    Recent Visits Date Type Provider Dept  03/31/19 Office Visit Gillis Santa, MD [facility]  Showing recent visits within past 90 days and meeting all other requirements   Today's Visits Date Type Provider Dept  06/25/19 Office Visit Gillis Santa, MD [facility]  Showing today's visits and meeting all other requirements   Future Appointments No visits were found meeting these conditions.  Showing future appointments within next 90 days and meeting all other requirements   I discussed the assessment and treatment plan with the patient. The patient was provided an opportunity to ask questions and all were answered. The patient agreed with the plan and demonstrated an understanding of the instructions.  Patient advised to call back or seek an in-person evaluation if the symptoms or condition worsens.  Duration of encounter: 25 minutes.  Note by: Gillis Santa, MD Date: 06/25/2019; Time: 2:27 PM

## 2019-08-18 ENCOUNTER — Encounter: Payer: Self-pay | Admitting: Student in an Organized Health Care Education/Training Program

## 2019-08-19 ENCOUNTER — Ambulatory Visit
Payer: Medicare Other | Attending: Student in an Organized Health Care Education/Training Program | Admitting: Student in an Organized Health Care Education/Training Program

## 2019-08-19 ENCOUNTER — Encounter: Payer: Self-pay | Admitting: Student in an Organized Health Care Education/Training Program

## 2019-08-19 ENCOUNTER — Other Ambulatory Visit: Payer: Self-pay

## 2019-08-19 DIAGNOSIS — G894 Chronic pain syndrome: Secondary | ICD-10-CM

## 2019-08-19 DIAGNOSIS — S22009S Unspecified fracture of unspecified thoracic vertebra, sequela: Secondary | ICD-10-CM

## 2019-08-19 DIAGNOSIS — M5136 Other intervertebral disc degeneration, lumbar region: Secondary | ICD-10-CM

## 2019-08-19 MED ORDER — HYDROCODONE-ACETAMINOPHEN 5-325 MG PO TABS: 1.0000 | ORAL_TABLET | Freq: Every day | ORAL | 0 refills | Status: DC | PRN

## 2019-08-19 NOTE — Progress Notes (Signed)
Patient: Susan Blackwell  Service Category: E/M  Provider: Gillis Santa, MD  DOB: November 07, 1919  DOS: 08/19/2019  Location: Office  MRN: [id number]  Setting: Ambulatory outpatient  Referring Provider: Kirk Ruths, MD  Type: Established Patient  Specialty: Interventional Pain Management  PCP: Kirk Ruths, MD  Location: Home  Delivery: TeleHealth     Virtual Encounter - Pain Management PROVIDER NOTE: Information contained herein reflects review and annotations entered in association with encounter. Interpretation of such information and data should be left to medically-trained personnel. Information provided to patient can be located elsewhere in the medical record under "Patient Instructions". Document created using STT-dictation technology, any transcriptional errors that may result from process are unintentional.    Contact & Pharmacy Preferred: [place]: [phone] (home) Mobile: [phone] (mobile) E-mail: No e-mail address on record  [facility] [address] [place] [address] Phone: [phone] Fax: [phone]   Pre-screening  Susan Blackwell offered "in-person" vs "virtual" encounter. She indicated preferring virtual for this encounter.   Reason COVID-19*  Social distancing based on CDC and AMA recommendations.   I contacted Susan Blackwell on 08/19/2019 via telephone.      I clearly identified myself as Gillis Santa, MD. I verified that I was speaking with the correct person using two identifiers (Name: Susan Blackwell, and date of birth: 03-05-1920).  This visit was completed via telephone due to the restrictions of the COVID-19 pandemic. All issues as above were discussed and addressed but no physical exam was performed. If it was felt that the patient should be evaluated in the office, they were directed there. The patient verbally consented to this visit. Patient was unable to complete an audio/visual  visit due to Technical difficulties and/or Lack of internet. Due to the catastrophic nature of the COVID-19 pandemic, this visit was done through audio contact only.  Location of the patient: home address (see Epic for details)  Location of the provider: office  Consent I sought verbal advanced consent from Susan Blackwell for virtual visit interactions. I informed Susan Blackwell of possible security and privacy concerns, risks, and limitations associated with providing "not-in-person" medical evaluation and management services. I also informed Susan Blackwell of the availability of "in-person" appointments. Finally, I informed her that there would be a charge for the virtual visit and that she could be  personally, fully or partially, financially responsible for it. Susan Blackwell expressed understanding and agreed to proceed.   Historic Elements   Susan Blackwell is a 86 y.o. year old, female patient evaluated today after her last contact with our practice on 06/25/2019. Susan Blackwell  has a past medical history of Bile duct stone (02/2016), Hypertension, and PONV (postoperative nausea and vomiting). She also  has a past surgical history that includes Cholecystectomy; ERCP (N/A, 03/11/2016); Breast biopsy; ERCP (N/A, 06/18/2016); Gastrointestinal stent removal (N/A, 06/18/2016); Spyglass cholangioscopy (N/A, 06/18/2016); and spyglass lithotripsy (N/A, 06/18/2016). Susan Blackwell has a current medication list which includes the following prescription(s): acetaminophen, aspirin ec, caltrate 600+d plus minerals, cholecalciferol, dimenhydrinate, arnicare, hydrochlorothiazide, menthol-methyl salicylate, losartan, magnesium hydroxide, meclizine, thera-gesic plus, metoprolol tartrate, pantoprazole, potassium chloride, sodium chloride, tetrahydrozoline hcl, trazodone, vitamin b-12, [START ON 09/02/2019] hydrocodone-acetaminophen, and [START ON 10/02/2019] hydrocodone-acetaminophen, and the following Facility-Administered Medications:  cefoTEtan (CEFOTAN) 1 g in dextrose 5 % 50 mL IVPB. She  reports that she has never smoked. She has never used smokeless tobacco. She reports that she does not drink alcohol or use drugs. Susan Blackwell  is allergic to tramadol and nsaids.   HPI  Today, she is being contacted for medication management.   No change in medical history since last visit.  Patient's pain is at baseline.  Patient continues multimodal pain regimen as prescribed.  States that it provides pain relief and improvement in functional status.  Pharmacotherapy Assessment  Analgesic: 08/04/2019  2   06/25/2019  Hydrocodone-Acetamin 5-325 MG  30.00  30 Bi Lat   [id number]   Gib (4800)   0  5.00 MME  Private Pay   [place]    Monitoring:  PMP: PDMP reviewed during this encounter.       Pharmacotherapy: No side-effects or adverse reactions reported. Compliance: No problems identified. Effectiveness: Clinically acceptable. Plan: Refer to "POC".  Laboratory Chemistry Profile   Renal Lab Results  Component Value Date   BUN 21 (H) 10/06/2016   CREATININE 1.20 (H) 10/06/2016   GFRAA 43 (L) 10/06/2016   GFRNONAA 37 (L) 10/06/2016     Hepatic Lab Results  Component Value Date   AST 28 10/06/2016   ALT 22 10/06/2016   ALBUMIN 3.4 (L) 10/06/2016   ALKPHOS 73 10/06/2016   LIPASE 30 10/06/2016     Electrolytes Lab Results  Component Value Date   NA 137 10/06/2016   K 3.6 10/06/2016   CL 102 10/06/2016   CALCIUM 9.0 10/06/2016     Bone No results found for: VD25OH, VD125OH2TOT, TD1761YW7, PX1062IR4, 25OHVITD1, 25OHVITD2, 25OHVITD3, TESTOFREE, TESTOSTERONE   Inflammation (CRP: Acute Phase) (ESR: Chronic Phase) No results found for: CRP, ESRSEDRATE, LATICACIDVEN     Note: Above Lab results reviewed.  Imaging  CT Thoracic Spine Wo Contrast CLINICAL DATA:  Fall 2 days ago with back pain bilateral shoulder pain since a fall.  EXAM: CT THORACIC SPINE WITHOUT CONTRAST  TECHNIQUE: Multidetector CT images of the thoracic  were obtained using the standard protocol without intravenous contrast.  COMPARISON:  None.  FINDINGS: Alignment: No traumatic malalignment. Mild degenerative thoracic dextrocurvature.  Vertebrae: Negative for vertebral fracture. The posterior left eighth rib is fractured on a chronic basis when compared with 09/30/2017 chest CT.  Paraspinal and other soft tissues: No acute finding in the lungs when allowing for motion artifact. There is trace pleural fluid or pleural thickening in the posterior left chest. Atherosclerosis.  Disc levels: Diffuse degenerative disc narrowing and endplate spurring  IMPRESSION: No acute finding.  Negative for thoracic spine fracture.  Electronically Signed   By: Monte Fantasia M.D.   On: 08/23/2018 12:23 DG Thoracic Spine 2 View CLINICAL DATA:  [place] 2 days ago.  Severe upper back pain.  EXAM: THORACIC SPINE 2 VIEWS  COMPARISON:  Chest CT 09/30/2017  FINDINGS: Mild S-shaped scoliosis in the spine. Dextroscoliosis of the thoracic spine with levoscoliosis of the upper lumbar spine. Vertebral body heights are maintained. No evidence for a compression fracture. Atherosclerotic calcifications in the aorta.  IMPRESSION: 1. No acute abnormality in the thoracic spine. 2. Scoliosis involving the thoracic and lumbar spine.  Electronically Signed   By: Markus Daft M.D.   On: 08/23/2018 11:36 DG Shoulder Right CLINICAL DATA:  Status post fall, bilateral shoulder pain  EXAM: RIGHT SHOULDER - 2+ VIEW  COMPARISON:  Chest x-ray 03/14/2016  FINDINGS: Generalized osteopenia. No acute fracture or dislocation. Mild osteoarthritis of the glenohumeral joint. Mild arthropathy of the acromioclavicular joint. No aggressive osseous lesion.  IMPRESSION: No acute osseous injury of the right shoulder.  Electronically Signed   By: Kathreen Devoid   On:  08/23/2018 11:33 DG Shoulder Left CLINICAL DATA:  Status post fall, bilateral shoulder  pain  EXAM: LEFT SHOULDER - 2+ VIEW  COMPARISON:  Chest x-ray 03/14/2016  FINDINGS: Generalized osteopenia. No acute fracture or dislocation. No aggressive osseous lesion. Mild osteoarthritis of the glenohumeral joint. Mild arthropathy of the acromioclavicular joint. Chondrocalcinosis of glenohumeral joint as can be seen with CPPD.  IMPRESSION: 1.  No acute osseous injury of the left shoulder. 2. Mild osteoarthritis of the left glenohumeral joint.  Electronically Signed   By: Kathreen Devoid   On: 08/23/2018 11:32 DG Chest 1 View CLINICAL DATA:  Status post fall, back pain, shoulder pain  EXAM: CHEST  1 VIEW  COMPARISON:  CT chest 09/30/2017  FINDINGS: There is bilateral chronic interstitial lung disease. There is no focal consolidation. There is no pleural effusion or pneumothorax. There is mild stable cardiomegaly. There is thoracic aortic atherosclerosis. There is an S-shaped curvature of the thoracolumbar spine.  There is no acute osseous abnormality.  IMPRESSION: No active disease.  Electronically Signed   By: Kathreen Devoid   On: 08/23/2018 11:30 CT Head Wo Contrast CLINICAL DATA:  Fall 2 days prior. Base of neck, bilateral shoulder and upper back pain.  EXAM: CT HEAD WITHOUT CONTRAST  CT CERVICAL SPINE WITHOUT CONTRAST  TECHNIQUE: Multidetector CT imaging of the head and cervical spine was performed following the standard protocol without intravenous contrast. Multiplanar CT image reconstructions of the cervical spine were also generated.  COMPARISON:  10/06/2016 brain MRI.  FINDINGS: CT HEAD FINDINGS  Brain: No evidence of parenchymal hemorrhage or extra-axial fluid collection. No mass lesion, mass effect, or midline shift. No CT evidence of acute infarction. Generalized cerebral volume loss. Nonspecific mild subcortical and periventricular white matter hypodensity, most in keeping with chronic small vessel ischemic change. No  ventriculomegaly.  Vascular: No acute abnormality.  Skull: No evidence of calvarial fracture.  Sinuses/Orbits: The visualized paranasal sinuses are essentially clear.  Other:  The mastoid air cells are unopacified.  CT CERVICAL SPINE FINDINGS  Alignment: Straightening of the cervical spine. No facet subluxation. Dens is well positioned between the lateral masses of C1. Minimal 2 mm anterolisthesis at C3-4 and C5-6.  Skull base and vertebrae: Nondisplaced fracture of the tip of transverse right T1 process. No cervical spine fracture. No primary bone lesion or focal pathologic process.  Soft tissues and spinal canal: No prevertebral edema. No visible canal hematoma.  Disc levels: Marked multilevel degenerative disc disease throughout the cervical spine, most prominent at C6-7. Advanced bilateral facet arthropathy. Mild degenerative foraminal stenosis on the right at C4-5.  Upper chest: No acute abnormality.  Other: Visualized mastoid air cells appear clear. No discrete thyroid nodules. No pathologically enlarged cervical nodes.  IMPRESSION: 1. No evidence of acute intracranial abnormality. No evidence of calvarial fracture. 2. Generalized cerebral volume loss and mild chronic small vessel ischemic changes in the cerebral white matter. 3. Nondisplaced fracture of the tip of the right T1 transverse process. No fracture within the cervical spine. No facet subluxation. 4. Marked multilevel degenerative changes in the cervical spine as detailed.  Electronically Signed   By: Ilona Sorrel M.D.   On: 08/23/2018 11:06 CT Cervical Spine Wo Contrast CLINICAL DATA:  Fall 2 days prior. Base of neck, bilateral shoulder and upper back pain.  EXAM: CT HEAD WITHOUT CONTRAST  CT CERVICAL SPINE WITHOUT CONTRAST  TECHNIQUE: Multidetector CT imaging of the head and cervical spine was performed following the standard protocol without intravenous contrast. Multiplanar CT  image  reconstructions of the cervical spine were also generated.  COMPARISON:  10/06/2016 brain MRI.  FINDINGS: CT HEAD FINDINGS  Brain: No evidence of parenchymal hemorrhage or extra-axial fluid collection. No mass lesion, mass effect, or midline shift. No CT evidence of acute infarction. Generalized cerebral volume loss. Nonspecific mild subcortical and periventricular white matter hypodensity, most in keeping with chronic small vessel ischemic change. No ventriculomegaly.  Vascular: No acute abnormality.  Skull: No evidence of calvarial fracture.  Sinuses/Orbits: The visualized paranasal sinuses are essentially clear.  Other:  The mastoid air cells are unopacified.  CT CERVICAL SPINE FINDINGS  Alignment: Straightening of the cervical spine. No facet subluxation. Dens is well positioned between the lateral masses of C1. Minimal 2 mm anterolisthesis at C3-4 and C5-6.  Skull base and vertebrae: Nondisplaced fracture of the tip of transverse right T1 process. No cervical spine fracture. No primary bone lesion or focal pathologic process.  Soft tissues and spinal canal: No prevertebral edema. No visible canal hematoma.  Disc levels: Marked multilevel degenerative disc disease throughout the cervical spine, most prominent at C6-7. Advanced bilateral facet arthropathy. Mild degenerative foraminal stenosis on the right at C4-5.  Upper chest: No acute abnormality.  Other: Visualized mastoid air cells appear clear. No discrete thyroid nodules. No pathologically enlarged cervical nodes.  IMPRESSION: 1. No evidence of acute intracranial abnormality. No evidence of calvarial fracture. 2. Generalized cerebral volume loss and mild chronic small vessel ischemic changes in the cerebral white matter. 3. Nondisplaced fracture of the tip of the right T1 transverse process. No fracture within the cervical spine. No facet subluxation. 4. Marked multilevel degenerative changes in the  cervical spine as detailed.  Electronically Signed   By: Ilona Sorrel M.D.   On: 08/23/2018 11:06  Assessment  Diagnoses of Closed fracture of transverse process of thoracic vertebra, sequela, Lumbar degenerative disc disease, and Chronic pain syndrome were pertinent to this visit.  Plan of Care   Ms. Scottlynn Lindell has a current medication list which includes the following long-term medication(s): caltrate 600+d plus minerals, losartan, metoprolol tartrate, pantoprazole, potassium chloride, sodium chloride, and trazodone.  Pharmacotherapy (Medications Ordered): Meds ordered this encounter  Medications  . HYDROcodone-acetaminophen (NORCO) 5-325 MG tablet    Sig: Take 1 tablet by mouth daily as needed for moderate pain. For chronic pain.  To last 30 days.    Dispense:  30 tablet    Refill:  0  . HYDROcodone-acetaminophen (NORCO) 5-325 MG tablet    Sig: Take 1 tablet by mouth daily as needed for moderate pain. For chronic pain.  To last 30 days.    Dispense:  30 tablet    Refill:  0   Follow-up plan:   Return in about 2 months (around 11/02/2019) for Medication Management.    Recent Visits Date Type Provider Dept  06/25/19 Office Visit Gillis Santa, MD [facility]  Showing recent visits within past 90 days and meeting all other requirements   Today's Visits Date Type Provider Dept  08/19/19 Telemedicine Gillis Santa, MD [facility]  Showing today's visits and meeting all other requirements   Future Appointments No visits were found meeting these conditions.  Showing future appointments within next 90 days and meeting all other requirements   I discussed the assessment and treatment plan with the patient. The patient was provided an opportunity to ask questions and all were answered. The patient agreed with the plan and demonstrated an understanding of the instructions.  Patient advised to  call back or seek an in-person evaluation if the symptoms or  condition worsens.  Duration of encounter: 25 minutes.  Note by: Gillis Santa, MD Date: 08/19/2019; Time: 1:22 PM

## 2019-09-02 ENCOUNTER — Other Ambulatory Visit: Payer: Self-pay

## 2019-09-02 ENCOUNTER — Emergency Department (HOSPITAL_COMMUNITY)
Admission: EM | Admit: 2019-09-02 | Discharge: 2019-09-03 | Disposition: A | Discharge status: Home / Self Care | Payer: Medicare Other | Attending: Emergency Medicine | Admitting: Emergency Medicine

## 2019-09-02 DIAGNOSIS — I10 Essential (primary) hypertension: Secondary | ICD-10-CM | POA: Insufficient documentation

## 2019-09-02 DIAGNOSIS — R531 Weakness: Secondary | ICD-10-CM | POA: Insufficient documentation

## 2019-09-02 DIAGNOSIS — Z20822 Contact with and (suspected) exposure to covid-19: Secondary | ICD-10-CM | POA: Diagnosis not present

## 2019-09-02 DIAGNOSIS — Z9049 Acquired absence of other specified parts of digestive tract: Secondary | ICD-10-CM | POA: Insufficient documentation

## 2019-09-02 DIAGNOSIS — Z79899 Other long term (current) drug therapy: Secondary | ICD-10-CM | POA: Insufficient documentation

## 2019-09-02 DIAGNOSIS — Z7982 Long term (current) use of aspirin: Secondary | ICD-10-CM | POA: Diagnosis not present

## 2019-09-02 LAB — CBC WITH DIFFERENTIAL/PLATELET
Abs Immature Granulocytes: 0.04 10*3/uL (ref 0.00–0.07)
Basophils Absolute: 0 10*3/uL (ref 0.0–0.1)
Basophils Relative: 0 %
Eosinophils Absolute: 0 10*3/uL (ref 0.0–0.5)
Eosinophils Relative: 0 %
HCT: 29 % — ABNORMAL LOW (ref 36.0–46.0)
Hemoglobin: 9.5 g/dL — ABNORMAL LOW (ref 12.0–15.0)
Immature Granulocytes: 1 %
Lymphocytes Relative: 5 %
Lymphs Abs: 0.3 10*3/uL — ABNORMAL LOW (ref 0.7–4.0)
MCH: 35.4 pg — ABNORMAL HIGH (ref 26.0–34.0)
MCHC: 32.8 g/dL (ref 30.0–36.0)
MCV: 108.2 fL — ABNORMAL HIGH (ref 80.0–100.0)
Monocytes Absolute: 0.5 10*3/uL (ref 0.1–1.0)
Monocytes Relative: 7 %
Neutro Abs: 5.7 10*3/uL (ref 1.7–7.7)
Neutrophils Relative %: 87 %
Platelets: 110 10*3/uL — ABNORMAL LOW (ref 150–400)
RBC: 2.68 MIL/uL — ABNORMAL LOW (ref 3.87–5.11)
RDW: 13.7 % (ref 11.5–15.5)
WBC: 6.5 10*3/uL (ref 4.0–10.5)
nRBC: 0 % (ref 0.0–0.2)

## 2019-09-02 LAB — COMPREHENSIVE METABOLIC PANEL
ALT: 19 U/L (ref 0–44)
AST: 22 U/L (ref 15–41)
Albumin: 3.8 g/dL (ref 3.5–5.0)
Alkaline Phosphatase: 56 U/L (ref 38–126)
Anion gap: 8 (ref 5–15)
BUN: 13 mg/dL (ref 8–23)
CO2: 24 mmol/L (ref 22–32)
Calcium: 9.1 mg/dL (ref 8.9–10.3)
Chloride: 104 mmol/L (ref 98–111)
Creatinine, Ser: 0.96 mg/dL (ref 0.44–1.00)
GFR calc Af Amer: 57 mL/min — ABNORMAL LOW (ref >60)
GFR calc non Af Amer: 50 mL/min — ABNORMAL LOW (ref >60)
Glucose, Bld: 112 mg/dL — ABNORMAL HIGH (ref 70–99)
Potassium: 3.6 mmol/L (ref 3.5–5.1)
Sodium: 136 mmol/L (ref 135–145)
Total Bilirubin: 1.1 mg/dL (ref 0.3–1.2)
Total Protein: 5.6 g/dL — ABNORMAL LOW (ref 6.5–8.1)

## 2019-09-02 LAB — URINALYSIS, ROUTINE W REFLEX MICROSCOPIC
Bilirubin Urine: NEGATIVE
Glucose, UA: NEGATIVE mg/dL
Ketones, ur: NEGATIVE mg/dL
Leukocytes,Ua: NEGATIVE
Nitrite: NEGATIVE
Protein, ur: NEGATIVE mg/dL
Specific Gravity, Urine: 1.01 (ref 1.005–1.030)
pH: 5 (ref 5.0–8.0)

## 2019-09-02 LAB — SARS CORONAVIRUS 2 BY RT PCR (HOSPITAL ORDER, PERFORMED IN ~~LOC~~ HOSPITAL LAB): SARS Coronavirus 2: NEGATIVE

## 2019-09-02 LAB — LACTIC ACID, PLASMA: Lactic Acid, Venous: 1.4 mmol/L (ref 0.5–1.9)

## 2019-09-02 MED ORDER — SODIUM CHLORIDE 0.9 % IV BOLUS
500.0000 mL | Freq: Once | INTRAVENOUS | Status: AC
  Administered 2019-09-02: 500 mL via INTRAVENOUS

## 2019-09-02 MED ORDER — HYDROCODONE-ACETAMINOPHEN 5-325 MG PO TABS
1.0000 | ORAL_TABLET | Freq: Once | ORAL | Status: AC
  Administered 2019-09-02: 1 via ORAL
  Filled 2019-09-02: qty 1

## 2019-09-02 MED ORDER — ACETAMINOPHEN 325 MG PO TABS
650.0000 mg | ORAL_TABLET | Freq: Once | ORAL | Status: AC
  Administered 2019-09-02: 650 mg via ORAL
  Filled 2019-09-02: qty 2

## 2019-09-02 NOTE — ED Notes (Signed)
Pt ambulated in the hall with one person assist. Steady gait noted.

## 2019-09-02 NOTE — ED Notes (Signed)
Contacted pt's friend Lajoyce Corners and pt's son Aurther Loft to organize a ride home; pts' son lives 1-1/2 hours away - will try to contact someone to pick up pt.

## 2019-09-02 NOTE — Discharge Instructions (Addendum)
The testing did not reveal any acute medical problems requiring hospitalization, at this time.  Continue taking your usual medicines and make sure you are getting plenty of rest.  Take your medicines as directed.  See your doctor next week for checkup.

## 2019-09-02 NOTE — ED Notes (Signed)
Pt informed this Clinical research associate that she has only had one cup of coffee this morning and a small glass of water today, so she does not feel that she can urinate. Purewick repositioned.

## 2019-09-02 NOTE — ED Notes (Signed)
Spoke with pt's friend, Lajoyce Corners. She lives in [place] and will pick up in a.m.

## 2019-09-02 NOTE — ED Triage Notes (Addendum)
Pt arrived via [facility] from home where pt lives alone. Normal ambulatory w/ walker and independent, however states she is too weak to ambulate today. Denies CP, or SOB. Hx of UTI but denies new urinary issues at this time. Pt is alert and oriented x4.

## 2019-09-02 NOTE — ED Provider Notes (Signed)
[facility] EMERGENCY DEPARTMENT Provider Note   CSN: [id number] Arrival date & time: 09/02/19  1609     History Chief Complaint  Patient presents with  . Weakness    Susan Blackwell is a 86 y.o. female.  HPI She is here for evaluation of weakness characterized by difficulty walking.  She reportedly used to be uses a walker.  She came by EMS for evaluation.  She reported to EMS that she is weak.  She is unable to specify anything additional to me.  She denies current chest pain, or shortness of breath.  She is unclear on any other recent history.  Level 5 caveat-altered mental status/poor historian    Past Medical History:  Diagnosis Date  . Bile duct stone 02/2016  . Hypertension   . PONV (postoperative nausea and vomiting)     Patient Active Problem List   Diagnosis Date Noted  . Chronic pain syndrome 09/25/2018  . Closed fracture of transverse process of thoracic vertebra (HCC) 09/25/2018  . DDD (degenerative disc disease), cervical 09/25/2018  . Lumbar degenerative disc disease 09/25/2018  . Primary osteoarthritis of both shoulders 09/25/2018  . Encounter for long-term opiate analgesic use 09/25/2018  . Anemia 03/25/2016  . Fever   . Bile duct stone 03/11/2016  . HTN (hypertension) 03/10/2016  . Common bile duct calculus 03/10/2016  . Common bile duct dilatation 03/10/2016    Past Surgical History:  Procedure Laterality Date  . BREAST BIOPSY    . CHOLECYSTECTOMY    . ERCP N/A 03/11/2016   Procedure: ENDOSCOPIC RETROGRADE CHOLANGIOPANCREATOGRAPHY (ERCP);  Surgeon: Vida Rigger, MD;  Location: [facility] ENDOSCOPY;  Service: Endoscopy;  Laterality: N/A;  . ERCP N/A 06/18/2016   Procedure: ENDOSCOPIC RETROGRADE CHOLANGIOPANCREATOGRAPHY (ERCP);  Surgeon: Vida Rigger, MD;  Location: [facility] ENDOSCOPY;  Service: Endoscopy;  Laterality: N/A;  moved for litho us/LH  . GASTROINTESTINAL STENT REMOVAL N/A 06/18/2016   Procedure: GASTROINTESTINAL STENT REMOVAL;  Surgeon:  Vida Rigger, MD;  Location: [facility] ENDOSCOPY;  Service: Endoscopy;  Laterality: N/A;  . SPYGLASS CHOLANGIOSCOPY N/A 06/18/2016   Procedure: XHBZJIRC CHOLANGIOSCOPY;  Surgeon: Vida Rigger, MD;  Location: [facility] ENDOSCOPY;  Service: Endoscopy;  Laterality: N/A;  Burman [place] LITHOTRIPSY N/A 06/18/2016   Procedure: VELFYBOF LITHOTRIPSY;  Surgeon: Vida Rigger, MD;  Location: [facility] ENDOSCOPY;  Service: Endoscopy;  Laterality: N/A;     OB History   No obstetric history on file.     No family history on file.  Social History   Tobacco Use  . Smoking status: Never Smoker  . Smokeless tobacco: Never Used  Substance Use Topics  . Alcohol use: No  . Drug use: No    Home Medications Prior to Admission medications   Medication Sig Start Date End Date Taking? Authorizing Provider  acetaminophen (TYLENOL) 650 MG CR tablet Take 650 mg by mouth in the morning and at bedtime.   Yes [provider]  aspirin EC 81 MG tablet Take 81 mg by mouth daily.   Yes [provider]  Calcium Carbonate-Vit D-Min (CALTRATE 600+D PLUS MINERALS) 600-800 MG-UNIT TABS Take 1 tablet by mouth daily.   Yes [provider]  Cholecalciferol 2000 units TABS Take 2,000 Units by mouth daily.    Yes [provider]  Homeopathic Products (ARNICARE) GEL Apply 1 application topically daily as needed (pain).   Yes [provider]  hydrochlorothiazide (HYDRODIURIL) 25 MG tablet Take 25 mg by mouth daily. 03/28/16  Yes [provider]  HYDROcodone-acetaminophen (NORCO) 5-325 MG  tablet Take 1 tablet by mouth daily as needed for moderate pain. For chronic pain.  To last 30 days. 09/02/19 10/02/19 Yes Edward Jolly, MD  losartan (COZAAR) 100 MG tablet Take 100 mg by mouth daily.   Yes [provider]  meclizine (ANTIVERT) 25 MG tablet Take 1 tablet (25 mg total) by mouth 3 (three) times daily as needed for dizziness. 10/06/16  Yes Loren Racer, MD  Menthol-Methyl Salicylate (THERA-GESIC  PLUS) CREA Apply 1 application topically daily as needed (pain).    Yes [provider]  metoprolol (LOPRESSOR) 50 MG tablet Take 50 mg by mouth 2 (two) times daily.   Yes [provider]  pantoprazole (PROTONIX) 40 MG tablet Take 40 mg by mouth daily.   Yes [provider]  potassium chloride (K-DUR) 10 MEQ tablet Take 10 mEq by mouth daily.  01/25/16  Yes [provider]  sodium chloride (OCEAN) 0.65 % SOLN nasal spray Place 1 spray into both nostrils as needed for congestion.   Yes [provider]  Tetrahydrozoline HCl (VISINE OP) Apply 1 drop to eye daily as needed (dry eyes).   Yes [provider]  vitamin B-12 (CYANOCOBALAMIN) 1000 MCG tablet Take 1,000 mcg by mouth daily.   Yes [provider]  HYDROcodone-acetaminophen (NORCO) 5-325 MG tablet Take 1 tablet by mouth daily as needed for moderate pain. For chronic pain.  To last 30 days. Patient not taking: Reported on 09/02/2019 10/02/19 11/01/19  Edward Jolly, MD    Allergies    Tramadol and Nsaids  Review of Systems   Review of Systems  Unable to perform ROS: Mental status change    Physical Exam Updated Vital Signs BP (!) 130/48   Pulse 86   Temp 98.6 F (37 C) (Oral)   Resp 15   SpO2 97%   Physical Exam Vitals and nursing note reviewed.  Constitutional:      General: She is not in acute distress.    Appearance: She is well-developed. She is not ill-appearing, toxic-appearing or diaphoretic.     Comments: Elderly, frail  HENT:     Head: Normocephalic and atraumatic.  Eyes:     Conjunctiva/sclera: Conjunctivae normal.     Pupils: Pupils are equal, round, and reactive to light.  Neck:     Trachea: Phonation normal.  Cardiovascular:     Rate and Rhythm: Normal rate and regular rhythm.  Pulmonary:     Effort: Pulmonary effort is normal. No respiratory distress.     Breath sounds: Normal breath sounds. No stridor.  Chest:     Chest wall: No tenderness.    Abdominal:     General: There is no distension.     Palpations: Abdomen is soft.     Tenderness: There is no abdominal tenderness. There is no guarding.  Musculoskeletal:        General: Normal range of motion.     Cervical back: Normal range of motion and neck supple.  Skin:    General: Skin is warm and dry.  Neurological:     Mental Status: She is alert.     Cranial Nerves: No cranial nerve deficit.     Motor: No abnormal muscle tone.     Coordination: Coordination normal.     Comments: No dysarthria, or aphasia  Psychiatric:        Mood and Affect: Mood normal.        Behavior: Behavior normal.     ED Results / Procedures / Treatments  Labs (all labs ordered are listed, but only abnormal results are displayed) Labs Reviewed  CBC WITH DIFFERENTIAL/PLATELET - Abnormal; Notable for the following components:      Result Value   RBC 2.68 (*)    Hemoglobin 9.5 (*)    HCT 29.0 (*)    MCV 108.2 (*)    MCH 35.4 (*)    Platelets 110 (*)    Lymphs Abs 0.3 (*)    All other components within normal limits  COMPREHENSIVE METABOLIC PANEL - Abnormal; Notable for the following components:   Glucose, Bld 112 (*)    Total Protein 5.6 (*)    GFR calc non Af Amer 50 (*)    GFR calc Af Amer 57 (*)    All other components within normal limits  URINALYSIS, ROUTINE W REFLEX MICROSCOPIC - Abnormal; Notable for the following components:   Hgb urine dipstick MODERATE (*)    Bacteria, UA MANY (*)    All other components within normal limits  SARS CORONAVIRUS 2 BY RT PCR (HOSPITAL ORDER, [place] [facility])  URINE CULTURE  LACTIC ACID, PLASMA  BLOOD GAS, VENOUS  LACTIC ACID, PLASMA    EKG EKG Interpretation  Date/Time:  Wednesday Sep 02 2019 16:30:05 EDT Ventricular Rate:  95 PR Interval:    QRS Duration: 126 QT Interval:  350 QTC Calculation: 440 R Axis:   25 Text Interpretation: Sinus rhythm Left bundle branch block since last tracing no significant change  Confirmed by Daleen Bo [phone]) on 09/02/2019 5:41:05 PM   Radiology No results found.  Procedures Procedures (including critical care time)  Medications Ordered in ED Medications  acetaminophen (TYLENOL) tablet 650 mg (650 mg Oral Given 09/02/19 1748)  sodium chloride 0.9 % bolus 500 mL (0 mLs Intravenous Stopped 09/02/19 2108)  HYDROcodone-acetaminophen (NORCO/VICODIN) 5-325 MG per tablet 1 tablet (1 tablet Oral Given 09/02/19 2054)    ED Course  I have reviewed the triage vital signs and the nursing notes.  Pertinent labs & imaging results that were available during my care of the patient were reviewed by me and considered in my medical decision making (see chart for details).  Clinical Course as of Sep 01 2298  Wed Sep 02, 2019  2011 Normal  SARS Coronavirus 2 by RT PCR (hospital order, performed in [facility] hospital lab) Nasopharyngeal Nasopharyngeal Swab [EW]  2011 Normal except hemoglobin low, MCV high, platelets low  CBC with Differential(!) [EW]  2012 Normal except glucose high, GFR low  Comprehensive metabolic panel(!) [EW]    Clinical Course User Index [EW] Daleen Bo, MD   MDM Rules/Calculators/A&P                       Patient Vitals for the past 24 hrs:  BP Temp Temp src Pulse Resp SpO2  09/02/19 2145 (!) 130/48 -- -- 86 15 97 %  09/02/19 2100 (!) 168/62 -- -- 87 19 96 %  09/02/19 2002 -- 98.6 F (37 C) Oral -- -- --  09/02/19 1930 (!) 139/54 -- -- 79 15 97 %  09/02/19 1845 (!) 141/69 -- -- 82 20 95 %  09/02/19 1745 (!) 172/56 -- -- 89 (!) 22 95 %  09/02/19 1730 (!) 155/55 -- -- 87 (!) 27 95 %  09/02/19 1715 (!) 156/54 -- -- 87 (!) 32 92 %  09/02/19 1700 (!) 157/56 -- -- 84 (!) 28 92 %  09/02/19 1645 (!) 159/54 -- -- 89 (!) 27  93 %  09/02/19 1630 (!) 171/53 -- -- 89 20 95 %  09/02/19 1628 -- (!) 103.1 F (39.5 C) Rectal -- -- --  09/02/19 1620 (!) 183/67 (!) 103.1 F (39.5 C) Oral 92 (!) 24 96 %  09/02/19 1619 (!) 183/67 -- -- 89 (!) 31 94 %     11:00 PM Reevaluation with update and discussion. After initial assessment and treatment, an updated evaluation reveals she remains comfortable.  She has been able to drink fluids here.  She is able to walk easily.  Findings discussed with the patient.  Patient's nurse discussed the findings with her son, who is in [place], [place].  Patient has a friend who will come and pick her up.Mancel Bale   Medical Decision Making:  This patient is presenting for evaluation of nonspecific weakness, which does require a range of treatment options, and is a complaint that involves a moderate risk of morbidity and mortality. The differential diagnoses include acute infection, metabolic instability, hemodynamic compromise. I decided  to review old records, and in summary elderly patient, with stable chronic problems, without acute recent injury or illness.  I did not require additional historical information from anyone.  Clinical Laboratory Tests Ordered, included CBC, Metabolic panel, Urinalysis and Covid testing. Review indicates reassuring labs with stable chronic anemia, elevated MCV, low platelets, mild hyperglycemia and low protein.  Urinalysis abnormal but not clearly indicative of UTI.  Lactic acid is normal Covid testing is negative.     Critical Interventions-clinical evaluation, laboratory testing, observation  After These Interventions, the Patient was reevaluated and was found stable for discharge with outpatient management.  She ambulated easily.  She does not have urinary tract symptoms.  Very low suspicion for UTI, bacterial infection or metabolic instability.  She was able to drink fluids in the ED.    CRITICAL CARE-no Performed by: Mancel Bale  Nursing Notes Reviewed/ Care Coordinated Applicable Imaging Reviewed Interpretation of Laboratory Data incorporated into ED treatment  The patient appears reasonably screened and/or stabilized for discharge and I doubt any  other medical condition or other [facility] requiring further screening, evaluation, or treatment in the ED at this time prior to discharge.  Plan: Home Medications-continue usual; Home Treatments-rest, fluids; return here if the recommended treatment, does not improve the symptoms; Recommended follow up-PCP, as needed   Final Clinical Impression(s) / ED Diagnoses Final diagnoses:  Weakness    Rx / DC Orders ED Discharge Orders    None       Mancel Bale, MD 09/03/19 0009

## 2019-09-03 NOTE — ED Notes (Signed)
Pt ambulated to bathroom with assistance.

## 2019-09-03 NOTE — ED Notes (Signed)
Pt ambulated to bathroom with minimal assistance.

## 2019-09-03 NOTE — ED Notes (Signed)
Friend to p'up in a.m.  bfast ordered

## 2019-09-03 NOTE — ED Notes (Signed)
Help get patient cleaned up into her clothes did vitals patient is resting with call bell in reach

## 2019-09-03 NOTE — ED Notes (Signed)
Pt given AVS/discharge instructions. Reviewed with pt, pt verbalized understanding.

## 2019-09-03 NOTE — ED Notes (Signed)
Pt's friend contacted to inquire about pickup time. She stated she would be here to pickup around 10-10:30.

## 2019-09-05 LAB — URINE CULTURE: Culture: >=100000 — AB

## 2019-09-06 ENCOUNTER — Telehealth: Payer: Self-pay | Admitting: Emergency Medicine

## 2019-09-06 NOTE — Progress Notes (Signed)
ED Antimicrobial Stewardship Positive Culture Follow Up   Susan Blackwell is an 86 y.o. female who presented to [facility] on 09/02/2019 with a chief complaint of weakness / difficulty walking  Chief Complaint  Patient presents with  . Weakness    Recent Results (from the past 720 hour(s))  SARS Coronavirus 2 by RT PCR (hospital order, performed in [facility] hospital lab) Nasopharyngeal Nasopharyngeal Swab     Status: None   Collection Time: 09/02/19  5:40 PM   Specimen: Nasopharyngeal Swab  Result Value Ref Range Status   SARS Coronavirus 2 NEGATIVE NEGATIVE Final    Comment: (NOTE) SARS-CoV-2 target nucleic acids are NOT DETECTED. The SARS-CoV-2 RNA is generally detectable in upper and lower respiratory specimens during the acute phase of infection. The lowest concentration of SARS-CoV-2 viral copies this assay can detect is 250 copies / mL. A negative result does not preclude SARS-CoV-2 infection and should not be used as the sole basis for treatment or other patient management decisions.  A negative result may occur with improper specimen collection / handling, submission of specimen other than nasopharyngeal swab, presence of viral mutation(s) within the areas targeted by this assay, and inadequate number of viral copies (<250 copies / mL). A negative result must be combined with clinical observations, patient history, and epidemiological information. Fact Sheet for Patients:   BoilerBrush.com.cy Fact Sheet for Healthcare Providers: https://pope.com/ This test is not yet approved or cleared  by the [place] FDA and has been authorized for detection and/or diagnosis of SARS-CoV-2 by FDA under an Emergency Use Authorization (EUA).  This EUA will remain in effect (meaning this test can be used) for the duration of the COVID-19 declaration under Section 564(b)(1) of the Act, 21 U.S.C. section 360bbb-3(b)(1), unless the  authorization is terminated or revoked sooner. Performed at [facility], [address], [place], [place] [address]   Urine culture     Status: Abnormal   Collection Time: 09/02/19  8:43 PM   Specimen: Urine, Catheterized  Result Value Ref Range Status   Specimen Description URINE, CATHETERIZED  Final   Special Requests   Final    NONE Performed at [facility], [address] [place], [place] [address]    Culture >=100,000 COLONIES/mL KLEBSIELLA PNEUMONIAE (A)  Final   Report Status 09/05/2019 FINAL  Final   Organism ID, Bacteria KLEBSIELLA PNEUMONIAE (A)  Final      Susceptibility   Klebsiella pneumoniae - MIC*    AMPICILLIN >=32 RESISTANT Resistant     CEFAZOLIN <=4 SENSITIVE Sensitive     CEFTRIAXONE <=1 SENSITIVE Sensitive     CIPROFLOXACIN <=0.25 SENSITIVE Sensitive     GENTAMICIN <=1 SENSITIVE Sensitive     IMIPENEM <=0.25 SENSITIVE Sensitive     NITROFURANTOIN 32 SENSITIVE Sensitive     TRIMETH/SULFA <=20 SENSITIVE Sensitive     AMPICILLIN/SULBACTAM 4 SENSITIVE Sensitive     PIP/TAZO <=4 SENSITIVE Sensitive     * >=100,000 COLONIES/mL KLEBSIELLA PNEUMONIAE    Patient placement to call patient and conduct symptom check. If weakness has resolved, no antibiotics necessary. If weakness persists or having any other symptoms of UTI (pain with urination, frequency, etc), please prescribe cephalexin 500 mg twice daily x 3 days   ED Provider: Ralph Leyden, PA-C    Vinnie Level, PharmD., BCPS, BCCCP Clinical Pharmacist Clinical phone for 09/06/19 until 10pm: [phone] If after 10pm, please refer to [facility] for unit-specific pharmacist

## 2019-09-06 NOTE — Telephone Encounter (Signed)
Post ED Visit - Positive Culture Follow-up: Unsuccessful Patient Follow-up  Culture assessed and recommendations reviewed by:  []  , Pharm.D. []  Enzo Bi, Pharm.D., BCPS AQ-ID []  , Pharm.D., BCPS []  Celedonio Miyamoto, Pharm.D., BCPS []  [place], Garvin Fila.D., BCPS, AAHIVP []  , Pharm.D., BCPS, AAHIVP [x]  Georgina Pillion, PharmD []  , PharmD, BCPS  Positive urine culture  [x]  Patient discharged without antimicrobial prescription and treatment is now indicated []  Organism is resistant to prescribed ED discharge antimicrobial []  Patient with positive blood cultures   Unable to contact patient at phone number on file, letter will be sent to address on file  [place] 09/06/2019, 4:59 PM

## 2019-09-18 ENCOUNTER — Telehealth: Payer: Self-pay | Admitting: Emergency Medicine

## 2019-09-18 NOTE — Telephone Encounter (Signed)
Post ED Visit - Positive Culture Follow-up: Successful Patient Follow-Up  Culture assessed and recommendations reviewed by:  []  , Pharm.D. []  Enzo Bi, Pharm.D., BCPS AQ-ID []  , Pharm.D., BCPS []  Celedonio Miyamoto, .D., BCPS []  [place], .D., BCPS, AAHIVP []  Georgina Pillion, Pharm.D., BCPS, AAHIVP []  [address], PharmD, BCPS []  , PharmD, BCPS []  [place], PharmD, BCPS [x]  [place], PharmD  Positive urine culture  [x]  Patient discharged without antimicrobial prescription and treatment is now indicated []  Organism is resistant to prescribed ED discharge antimicrobial []  Patient with positive blood cultures  Changes discussed with ED provider: , PA New antibiotic prescription: Cephalexin 500 mg PO twice daily x three days Called to [facility] [phone]  Contacted patient, date 05/28/2021time 1045   09/18/2019, 10:51 AM

## 2019-10-29 ENCOUNTER — Encounter: Payer: Self-pay | Admitting: Student in an Organized Health Care Education/Training Program

## 2019-11-02 ENCOUNTER — Encounter: Payer: Self-pay | Admitting: Student in an Organized Health Care Education/Training Program

## 2019-11-02 ENCOUNTER — Ambulatory Visit
Payer: Medicare Other | Attending: Student in an Organized Health Care Education/Training Program | Admitting: Student in an Organized Health Care Education/Training Program

## 2019-11-02 ENCOUNTER — Other Ambulatory Visit: Payer: Self-pay

## 2019-11-02 DIAGNOSIS — M5136 Other intervertebral disc degeneration, lumbar region: Secondary | ICD-10-CM | POA: Diagnosis not present

## 2019-11-02 DIAGNOSIS — G894 Chronic pain syndrome: Secondary | ICD-10-CM | POA: Diagnosis not present

## 2019-11-02 DIAGNOSIS — S22009S Unspecified fracture of unspecified thoracic vertebra, sequela: Secondary | ICD-10-CM

## 2019-11-02 MED ORDER — HYDROCODONE-ACETAMINOPHEN 5-325 MG PO TABS: 1.0000 | ORAL_TABLET | Freq: Every day | ORAL | 0 refills | Status: DC | PRN

## 2019-11-02 NOTE — Progress Notes (Signed)
Patient: Susan Blackwell  Service Category: E/M  Provider: Gillis Santa, MD  DOB: 1919-11-30  DOS: 11/02/2019  Location: Office  MRN: [id number]  Setting: Ambulatory outpatient  Referring Provider: Kirk Ruths, MD  Type: Established Patient  Specialty: Interventional Pain Management  PCP: Kirk Ruths, MD  Location: Home  Delivery: TeleHealth     Virtual Encounter - Pain Management PROVIDER NOTE: Information contained herein reflects review and annotations entered in association with encounter. Interpretation of such information and data should be left to medically-trained personnel. Information provided to patient can be located elsewhere in the medical record under "Patient Instructions". Document created using STT-dictation technology, any transcriptional errors that may result from process are unintentional.    Contact & Pharmacy Preferred: [place]: [phone] (home) Mobile: [phone] (mobile) E-mail: No e-mail address on record  [place], [place] [facility] [place] [address] [place] [address] Phone: [phone] Fax: [phone]   Pre-screening  [name] offered "in-person" vs "virtual" encounter. She indicated preferring virtual for this encounter.   Reason COVID-19*   Social distancing based on CDC and AMA recommendations.   I contacted Lucelia Lacey on 11/02/2019 via telephone.      I clearly identified myself as Gillis Santa, MD. I verified that I was speaking with the correct person using two identifiers (Name: Susan Blackwell, and date of birth: 1920/02/23).  Consent I sought verbal advanced consent from Donavan Burnet for virtual visit interactions. I informed [name] of possible security and privacy concerns, risks, and limitations associated with providing "not-in-person" medical evaluation and management services. I also informed [name] of the availability of "in-person" appointments. Finally, I informed  her that there would be a charge for the virtual visit and that she could be  personally, fully or partially, financially responsible for it. [name] expressed understanding and agreed to proceed.   Historic Elements   Susan Blackwell is a 86 y.o. year old, female patient evaluated today after her last contact with our practice on 06/25/2019. [name]  has a past medical history of Bile duct stone (02/2016), Hypertension, and PONV (postoperative nausea and vomiting). She also  has a past surgical history that includes Cholecystectomy; ERCP (N/A, 03/11/2016); Breast biopsy; ERCP (N/A, 06/18/2016); Gastrointestinal stent removal (N/A, 06/18/2016); Spyglass cholangioscopy (N/A, 06/18/2016); and spyglass lithotripsy (N/A, 06/18/2016). [name] has a current medication list which includes the following prescription(s): acetaminophen, aspirin ec, caltrate 600+d plus minerals, cholecalciferol, arnicare, hydrochlorothiazide, hydrocodone-acetaminophen, [START ON 12/02/2019] hydrocodone-acetaminophen, [START ON 01/01/2020] hydrocodone-acetaminophen, losartan, meclizine, thera-gesic plus, metoprolol tartrate, pantoprazole, potassium chloride, sodium chloride, tetrahydrozoline hcl, and vitamin b-12, and the following Facility-Administered Medications: cefoTEtan (CEFOTAN) 1 g in dextrose 5 % 50 mL IVPB. She  reports that she has never smoked. She has never used smokeless tobacco. She reports that she does not drink alcohol and does not use drugs. [name] is allergic to tramadol and nsaids.   HPI  Today, she is being contacted for medication management.   No change in medical history since last visit.  Patient's pain is at baseline.  Patient continues multimodal pain regimen as prescribed.  States that it provides pain relief and improvement in functional status. Feels that she has parasite, has had stool sample analyzed which she says was negative but she still feels that ova/parasite may be present. Encouraged her  to discuss with PCP as she is still concerned about it.  Pharmacotherapy Assessment  Analgesic: 10/02/2019  1   08/19/2019  Hydrocodone-Acetamin 5-325  MG  30.00  30 Bi Lat   [id number]   Gib (4800)   0/0  5.00 MME  Private Pay   [place]    Monitoring: [place] PMP: PDMP reviewed during this encounter.       Pharmacotherapy: No side-effects or adverse reactions reported. Compliance: No problems identified. Effectiveness: Clinically acceptable. Plan: Refer to "POC".  Laboratory Chemistry Profile   Renal Lab Results  Component Value Date   BUN 13 09/02/2019   CREATININE 0.96 09/02/2019   GFRAA 57 (L) 09/02/2019   GFRNONAA 50 (L) 09/02/2019     Hepatic Lab Results  Component Value Date   AST 22 09/02/2019   ALT 19 09/02/2019   ALBUMIN 3.8 09/02/2019   ALKPHOS 56 09/02/2019   LIPASE 30 10/06/2016     Electrolytes Lab Results  Component Value Date   NA 136 09/02/2019   K 3.6 09/02/2019   CL 104 09/02/2019   CALCIUM 9.1 09/02/2019     Bone No results found for: VD25OH, VD125OH2TOT, JA2505LZ7, QB3419FX9, 25OHVITD1, 25OHVITD2, 25OHVITD3, TESTOFREE, TESTOSTERONE   Inflammation (CRP: Acute Phase) (ESR: Chronic Phase) Lab Results  Component Value Date   LATICACIDVEN 1.4 09/02/2019       Note: Above Lab results reviewed.   Imaging  CT Thoracic Spine Wo Contrast CLINICAL DATA:  Fall 2 days ago with back pain bilateral shoulder pain since a fall.  EXAM: CT THORACIC SPINE WITHOUT CONTRAST  TECHNIQUE: Multidetector CT images of the thoracic were obtained using the standard protocol without intravenous contrast.  COMPARISON:  None.  FINDINGS: Alignment: No traumatic malalignment. Mild degenerative thoracic dextrocurvature.  Vertebrae: Negative for vertebral fracture. The posterior left eighth rib is fractured on a chronic basis when compared with 09/30/2017 chest CT.  Paraspinal and other soft tissues: No acute finding in the lungs when allowing for motion artifact. There  is trace pleural fluid or pleural thickening in the posterior left chest. Atherosclerosis.  Disc levels: Diffuse degenerative disc narrowing and endplate spurring  IMPRESSION: No acute finding.  Negative for thoracic spine fracture.  Electronically Signed   By: Monte Fantasia M.D.   On: 08/23/2018 12:23 DG Thoracic Spine 2 View CLINICAL DATA:  [place] 2 days ago.  Severe upper back pain.  EXAM: THORACIC SPINE 2 VIEWS  COMPARISON:  Chest CT 09/30/2017  FINDINGS: Mild S-shaped scoliosis in the spine. Dextroscoliosis of the thoracic spine with levoscoliosis of the upper lumbar spine. Vertebral body heights are maintained. No evidence for a compression fracture. Atherosclerotic calcifications in the aorta.  IMPRESSION: 1. No acute abnormality in the thoracic spine. 2. Scoliosis involving the thoracic and lumbar spine.  Electronically Signed   By: Markus Daft M.D.   On: 08/23/2018 11:36 DG Shoulder Right CLINICAL DATA:  Status post fall, bilateral shoulder pain  EXAM: RIGHT SHOULDER - 2+ VIEW  COMPARISON:  Chest x-ray 03/14/2016  FINDINGS: Generalized osteopenia. No acute fracture or dislocation. Mild osteoarthritis of the glenohumeral joint. Mild arthropathy of the acromioclavicular joint. No aggressive osseous lesion.  IMPRESSION: No acute osseous injury of the right shoulder.  Electronically Signed   By: Kathreen Devoid   On: 08/23/2018 11:33 DG Shoulder Left CLINICAL DATA:  Status post fall, bilateral shoulder pain  EXAM: LEFT SHOULDER - 2+ VIEW  COMPARISON:  Chest x-ray 03/14/2016  FINDINGS: Generalized osteopenia. No acute fracture or dislocation. No aggressive osseous lesion. Mild osteoarthritis of the glenohumeral joint. Mild arthropathy of the acromioclavicular joint. Chondrocalcinosis of glenohumeral joint as can be seen with CPPD.  IMPRESSION:  1.  No acute osseous injury of the left shoulder. 2. Mild osteoarthritis of the left glenohumeral  joint.  Electronically Signed   By: Kathreen Devoid   On: 08/23/2018 11:32 DG Chest 1 View CLINICAL DATA:  Status post fall, back pain, shoulder pain  EXAM: CHEST  1 VIEW  COMPARISON:  CT chest 09/30/2017  FINDINGS: There is bilateral chronic interstitial lung disease. There is no focal consolidation. There is no pleural effusion or pneumothorax. There is mild stable cardiomegaly. There is thoracic aortic atherosclerosis. There is an S-shaped curvature of the thoracolumbar spine.  There is no acute osseous abnormality.  IMPRESSION: No active disease.  Electronically Signed   By: Kathreen Devoid   On: 08/23/2018 11:30 CT Head Wo Contrast CLINICAL DATA:  Fall 2 days prior. Base of neck, bilateral shoulder and upper back pain.  EXAM: CT HEAD WITHOUT CONTRAST  CT CERVICAL SPINE WITHOUT CONTRAST  TECHNIQUE: Multidetector CT imaging of the head and cervical spine was performed following the standard protocol without intravenous contrast. Multiplanar CT image reconstructions of the cervical spine were also generated.  COMPARISON:  10/06/2016 brain MRI.  FINDINGS: CT HEAD FINDINGS  Brain: No evidence of parenchymal hemorrhage or extra-axial fluid collection. No mass lesion, mass effect, or midline shift. No CT evidence of acute infarction. Generalized cerebral volume loss. Nonspecific mild subcortical and periventricular white matter hypodensity, most in keeping with chronic small vessel ischemic change. No ventriculomegaly.  Vascular: No acute abnormality.  Skull: No evidence of calvarial fracture.  Sinuses/Orbits: The visualized paranasal sinuses are essentially clear.  Other:  The mastoid air cells are unopacified.  CT CERVICAL SPINE FINDINGS  Alignment: Straightening of the cervical spine. No facet subluxation. Dens is well positioned between the lateral masses of C1. Minimal 2 mm anterolisthesis at C3-4 and C5-6.  Skull base and vertebrae: Nondisplaced  fracture of the tip of transverse right T1 process. No cervical spine fracture. No primary bone lesion or focal pathologic process.  Soft tissues and spinal canal: No prevertebral edema. No visible canal hematoma.  Disc levels: Marked multilevel degenerative disc disease throughout the cervical spine, most prominent at C6-7. Advanced bilateral facet arthropathy. Mild degenerative foraminal stenosis on the right at C4-5.  Upper chest: No acute abnormality.  Other: Visualized mastoid air cells appear clear. No discrete thyroid nodules. No pathologically enlarged cervical nodes.  IMPRESSION: 1. No evidence of acute intracranial abnormality. No evidence of calvarial fracture. 2. Generalized cerebral volume loss and mild chronic small vessel ischemic changes in the cerebral white matter. 3. Nondisplaced fracture of the tip of the right T1 transverse process. No fracture within the cervical spine. No facet subluxation. 4. Marked multilevel degenerative changes in the cervical spine as detailed.  Electronically Signed   By: Ilona Sorrel M.D.   On: 08/23/2018 11:06 CT Cervical Spine Wo Contrast CLINICAL DATA:  Fall 2 days prior. Base of neck, bilateral shoulder and upper back pain.  EXAM: CT HEAD WITHOUT CONTRAST  CT CERVICAL SPINE WITHOUT CONTRAST  TECHNIQUE: Multidetector CT imaging of the head and cervical spine was performed following the standard protocol without intravenous contrast. Multiplanar CT image reconstructions of the cervical spine were also generated.  COMPARISON:  10/06/2016 brain MRI.  FINDINGS: CT HEAD FINDINGS  Brain: No evidence of parenchymal hemorrhage or extra-axial fluid collection. No mass lesion, mass effect, or midline shift. No CT evidence of acute infarction. Generalized cerebral volume loss. Nonspecific mild subcortical and periventricular white matter hypodensity, most in keeping with chronic small vessel  ischemic change. No  ventriculomegaly.  Vascular: No acute abnormality.  Skull: No evidence of calvarial fracture.  Sinuses/Orbits: The visualized paranasal sinuses are essentially clear.  Other:  The mastoid air cells are unopacified.  CT CERVICAL SPINE FINDINGS  Alignment: Straightening of the cervical spine. No facet subluxation. Dens is well positioned between the lateral masses of C1. Minimal 2 mm anterolisthesis at C3-4 and C5-6.  Skull base and vertebrae: Nondisplaced fracture of the tip of transverse right T1 process. No cervical spine fracture. No primary bone lesion or focal pathologic process.  Soft tissues and spinal canal: No prevertebral edema. No visible canal hematoma.  Disc levels: Marked multilevel degenerative disc disease throughout the cervical spine, most prominent at C6-7. Advanced bilateral facet arthropathy. Mild degenerative foraminal stenosis on the right at C4-5.  Upper chest: No acute abnormality.  Other: Visualized mastoid air cells appear clear. No discrete thyroid nodules. No pathologically enlarged cervical nodes.  IMPRESSION: 1. No evidence of acute intracranial abnormality. No evidence of calvarial fracture. 2. Generalized cerebral volume loss and mild chronic small vessel ischemic changes in the cerebral white matter. 3. Nondisplaced fracture of the tip of the right T1 transverse process. No fracture within the cervical spine. No facet subluxation. 4. Marked multilevel degenerative changes in the cervical spine as detailed.  Electronically Signed   By: Ilona Sorrel M.D.   On: 08/23/2018 11:06  Assessment  Diagnoses of Closed fracture of transverse process of thoracic vertebra, sequela, Lumbar degenerative disc disease, and Chronic pain syndrome were pertinent to this visit.  Plan of Care  Ms. Brigett Estell has a current medication list which includes the following long-term medication(s): caltrate 600+d plus minerals, losartan, metoprolol tartrate,  pantoprazole, potassium chloride, and sodium chloride.  Pharmacotherapy (Medications Ordered): Meds ordered this encounter  Medications   HYDROcodone-acetaminophen (NORCO) 5-325 MG tablet    Sig: Take 1 tablet by mouth daily as needed for moderate pain. For chronic pain.  To last 30 days.    Dispense:  30 tablet    Refill:  0   HYDROcodone-acetaminophen (NORCO) 5-325 MG tablet    Sig: Take 1 tablet by mouth daily as needed for moderate pain. For chronic pain.  To last 30 days.    Dispense:  30 tablet    Refill:  0   HYDROcodone-acetaminophen (NORCO) 5-325 MG tablet    Sig: Take 1 tablet by mouth daily as needed for moderate pain. For chronic pain.  To last 30 days.    Dispense:  30 tablet    Refill:  0    Follow-up plan:   Return in about 3 months (around 02/02/2020) for Medication Management, in person.   Recent Visits Date Type Provider Dept  08/19/19 Telemedicine Gillis Santa, MD [facility]  Showing recent visits within past 90 days and meeting all other requirements Today's Visits Date Type Provider Dept  11/02/19 Telemedicine Gillis Santa, MD [facility]  Showing today's visits and meeting all other requirements Future Appointments No visits were found meeting these conditions. Showing future appointments within next 90 days and meeting all other requirements  I discussed the assessment and treatment plan with the patient. The patient was provided an opportunity to ask questions and all were answered. The patient agreed with the plan and demonstrated an understanding of the instructions.  Patient advised to call back or seek an in-person evaluation if the symptoms or condition worsens.  Duration of encounter: 30 minutes.  Note by: Gillis Santa, MD Date: 11/02/2019; Time:  3:01 PM

## 2020-02-02 ENCOUNTER — Telehealth: Payer: Self-pay

## 2020-02-02 ENCOUNTER — Other Ambulatory Visit: Payer: Self-pay

## 2020-02-02 ENCOUNTER — Encounter: Payer: Self-pay | Admitting: Student in an Organized Health Care Education/Training Program

## 2020-02-02 ENCOUNTER — Ambulatory Visit
Payer: Medicare Other | Attending: Student in an Organized Health Care Education/Training Program | Admitting: Student in an Organized Health Care Education/Training Program

## 2020-02-02 VITALS — BP 172/62 | HR 67 | Temp 97.1°F | Resp 16 | Ht <= 58 in | Wt 90.0 lb

## 2020-02-02 DIAGNOSIS — M5030 Other cervical disc degeneration, unspecified cervical region: Secondary | ICD-10-CM | POA: Insufficient documentation

## 2020-02-02 DIAGNOSIS — S22009S Unspecified fracture of unspecified thoracic vertebra, sequela: Secondary | ICD-10-CM | POA: Diagnosis not present

## 2020-02-02 DIAGNOSIS — Z79891 Long term (current) use of opiate analgesic: Secondary | ICD-10-CM | POA: Diagnosis present

## 2020-02-02 DIAGNOSIS — M19012 Primary osteoarthritis, left shoulder: Secondary | ICD-10-CM | POA: Diagnosis present

## 2020-02-02 DIAGNOSIS — M19011 Primary osteoarthritis, right shoulder: Secondary | ICD-10-CM | POA: Insufficient documentation

## 2020-02-02 DIAGNOSIS — G894 Chronic pain syndrome: Secondary | ICD-10-CM | POA: Insufficient documentation

## 2020-02-02 DIAGNOSIS — M5136 Other intervertebral disc degeneration, lumbar region: Secondary | ICD-10-CM | POA: Insufficient documentation

## 2020-02-02 DIAGNOSIS — M1712 Unilateral primary osteoarthritis, left knee: Secondary | ICD-10-CM | POA: Diagnosis not present

## 2020-02-02 MED ORDER — HYDROCODONE-ACETAMINOPHEN 5-325 MG PO TABS: 1.0000 | ORAL_TABLET | Freq: Every day | ORAL | 0 refills | Status: DC | PRN

## 2020-02-02 NOTE — Progress Notes (Signed)
PROVIDER NOTE: Information contained herein reflects review and annotations entered in association with encounter. Interpretation of such information and data should be left to medically-trained personnel. Information provided to patient can be located elsewhere in the medical record under "Patient Instructions". Document created using STT-dictation technology, any transcriptional errors that may result from process are unintentional.    Patient: Susan Blackwell  Service Category: E/M  Provider: Gillis Santa, MD  DOB: [birth year]  DOS: 02/02/2020  Specialty: Interventional Pain Management  MRN: [id number]  Setting: Ambulatory outpatient  PCP: Kirk Ruths, MD  Type: Established Patient    Referring Provider: Kirk Ruths, MD  Location: Office  Delivery: Face-to-face     HPI  Ms. Susan Blackwell, a 86 y.o. year old female, is here today because of her Closed fracture of transverse process of thoracic vertebra, sequela [S22.009S]. Ms. Susan primary complain today is Hip Pain (bilat) and Leg Pain (bilat) Last encounter: My last encounter with her was on 11/02/19 Pertinent problems: Ms. Blackwell has Chronic pain syndrome; Closed fracture of transverse process of thoracic vertebra ([place]); DDD (degenerative disc disease), cervical; Lumbar degenerative disc disease; Primary osteoarthritis of both shoulders; and Encounter for long-term opiate analgesic use on their pertinent problem list. Pain Assessment: Severity of Chronic pain is reported as a 0-No pain/10. Location: Hip Right, Left/sometimes down legs to shins; pain gets worse as the day goes on. Onset: More than a month ago. Quality: Aching. Timing: Intermittent. Modifying factor(s): meds. Vitals:  height is _0  (1.448 m) and weight is 90 lb (40.8 kg). Her temporal temperature is 97.1 F (36.2 C) (abnormal). Her blood pressure is 172/62 (abnormal) and her pulse is 67. Her respiration is 16 and oxygen saturation is 98%.   Reason for  encounter: medication management.   No change in medical history since last visit.  Patient's pain is at baseline.  Patient continues pain regimen as prescribed.  States that it provides pain relief and improvement in functional status.   Pharmacotherapy Assessment   01/01/2020  1   11/02/2019  Hydrocodone-Acetamin 5-325 MG  30.00  30 Bi Lat   [id number]   Gib (4800)   0/0  5.00 MME  Private Pay   Susan Blackwell  12/02/2019  1   11/02/2019  Hydrocodone-Acetamin 5-325 MG  30.00  30 Bi Lat   [id number]   Gib (4800)   0/0  5.00 MME  Private Pay   Susan Blackwell  11/02/2019  1   11/02/2019  Hydrocodone-Acetamin 5-325 MG  30.00  30 Bi Lat   [id number]   Gib (4800)   0/0  5.00 MME  Private Pay   Susan Blackwell     Analgesic: Hydrocodone 5 mg daily as needed, quantity 30/month; MME 5    Monitoring: [place] PMP: PDMP reviewed during this encounter.       Pharmacotherapy: No side-effects or adverse reactions reported. Compliance: No problems identified. Effectiveness: Clinically acceptable.  Rise Patience, RN  02/02/2020  9:46 AM  Sign when Signing Visit Nursing Pain Medication Assessment:  Safety precautions to be maintained throughout the outpatient stay will include: orient to surroundings, keep bed in low position, maintain call bell within reach at all times, provide assistance with transfer out of bed and ambulation.  Medication Inspection Compliance: Ms. Susan Blackwell did not comply with our request to bring her pills to be counted. She was reminded that bringing the medication bottles, even when empty, is a requirement.  Medication: None brought in. Pill/Patch Count: None available to be counted. Bottle Appearance:  No container available. Did not bring bottle(s) to appointment. Filled Date: N/A Last Medication intake:  Yesterday   Pt. Instructed to bring Pill containers to each visit.    UDS: No results found for: SUMMARY   ROS  Constitutional: Denies any fever or chills Gastrointestinal: No reported hemesis, hematochezia, vomiting,  or acute GI distress Musculoskeletal: Bilateral hip and leg pain Neurological: No reported episodes of acute onset apraxia, aphasia, dysarthria, agnosia, amnesia, paralysis, loss of coordination, or loss of consciousness  Medication Review  Arnicare, Caltrate 600+D Plus Minerals, Cholecalciferol, HYDROcodone-acetaminophen, Tetrahydrozoline HCl, Thera-Gesic Plus, acetaminophen, aspirin EC, hydrochlorothiazide, losartan, meclizine, metoprolol tartrate, pantoprazole, potassium chloride, sodium chloride, and vitamin B-12  History Review  Allergy: Ms. Susan Blackwell is allergic to tramadol and nsaids. Drug: Ms. Susan Blackwell  reports no history of drug use. Alcohol:  reports no history of alcohol use. Tobacco:  reports that she has never smoked. She has never used smokeless tobacco. Social: Ms. Susan Blackwell  reports that she has never smoked. She has never used smokeless tobacco. She reports that she does not drink alcohol and does not use drugs. Medical:  has a past medical history of Bile duct stone (02/2016), Hypertension, and PONV (postoperative nausea and vomiting). Surgical: Ms. Susan Blackwell  has a past surgical history that includes Cholecystectomy; ERCP (N/A, 03/11/2016); Breast biopsy; ERCP (N/A, 06/18/2016); Gastrointestinal stent removal (N/A, 06/18/2016); Spyglass cholangioscopy (N/A, 06/18/2016); and spyglass lithotripsy (N/A, 06/18/2016). Family: family history is not on file.  Laboratory Chemistry Profile   Renal Lab Results  Component Value Date   BUN 13 09/02/2019   CREATININE 0.96 09/02/2019   GFRAA 57 (L) 09/02/2019   GFRNONAA 50 (L) 09/02/2019     Hepatic Lab Results  Component Value Date   AST 22 09/02/2019   ALT 19 09/02/2019   ALBUMIN 3.8 09/02/2019   ALKPHOS 56 09/02/2019   LIPASE 30 10/06/2016     Electrolytes Lab Results  Component Value Date   NA 136 09/02/2019   K 3.6 09/02/2019   CL 104 09/02/2019   CALCIUM 9.1 09/02/2019     Bone No results found for: VD25OH, VD125OH2TOT,  HU3149FW2, OV7858IF0, 25OHVITD1, 25OHVITD2, 25OHVITD3, TESTOFREE, TESTOSTERONE   Inflammation (CRP: Acute Phase) (ESR: Chronic Phase) Lab Results  Component Value Date   LATICACIDVEN 1.4 09/02/2019       Note: Above Lab results reviewed.  Recent Imaging Review  CT Thoracic Spine Wo Contrast CLINICAL DATA:  Fall 2 days ago with back pain bilateral shoulder pain since a fall.  EXAM: CT THORACIC SPINE WITHOUT CONTRAST  TECHNIQUE: Multidetector CT images of the thoracic were obtained using the standard protocol without intravenous contrast.  COMPARISON:  None.  FINDINGS: Alignment: No traumatic malalignment. Mild degenerative thoracic dextrocurvature.  Vertebrae: Negative for vertebral fracture. The posterior left eighth rib is fractured on a chronic basis when compared with 09/30/2017 chest CT.  Paraspinal and other soft tissues: No acute finding in the lungs when allowing for motion artifact. There is trace pleural fluid or pleural thickening in the posterior left chest. Atherosclerosis.  Disc levels: Diffuse degenerative disc narrowing and endplate spurring  IMPRESSION: No acute finding.  Negative for thoracic spine fracture.  Electronically Signed   By: Monte Fantasia M.D.   On: 08/23/2018 12:23 DG Thoracic Spine 2 View CLINICAL DATA:  [place] 2 days ago.  Severe upper back pain.  EXAM: THORACIC SPINE 2 VIEWS  COMPARISON:  Chest CT 09/30/2017  FINDINGS: Mild S-shaped scoliosis in the spine. Dextroscoliosis of the thoracic spine with levoscoliosis of  the upper lumbar spine. Vertebral body heights are maintained. No evidence for a compression fracture. Atherosclerotic calcifications in the aorta.  IMPRESSION: 1. No acute abnormality in the thoracic spine. 2. Scoliosis involving the thoracic and lumbar spine.  Electronically Signed   By: Markus Daft M.D.   On: 08/23/2018 11:36 DG Shoulder Right CLINICAL DATA:  Status post fall, bilateral shoulder  pain  EXAM: RIGHT SHOULDER - 2+ VIEW  COMPARISON:  Chest x-ray 03/14/2016  FINDINGS: Generalized osteopenia. No acute fracture or dislocation. Mild osteoarthritis of the glenohumeral joint. Mild arthropathy of the acromioclavicular joint. No aggressive osseous lesion.  IMPRESSION: No acute osseous injury of the right shoulder.  Electronically Signed   By: Kathreen Devoid   On: 08/23/2018 11:33 DG Shoulder Left CLINICAL DATA:  Status post fall, bilateral shoulder pain  EXAM: LEFT SHOULDER - 2+ VIEW  COMPARISON:  Chest x-ray 03/14/2016  FINDINGS: Generalized osteopenia. No acute fracture or dislocation. No aggressive osseous lesion. Mild osteoarthritis of the glenohumeral joint. Mild arthropathy of the acromioclavicular joint. Chondrocalcinosis of glenohumeral joint as can be seen with CPPD.  IMPRESSION: 1.  No acute osseous injury of the left shoulder. 2. Mild osteoarthritis of the left glenohumeral joint.  Electronically Signed   By: Kathreen Devoid   On: 08/23/2018 11:32 DG Chest 1 View CLINICAL DATA:  Status post fall, back pain, shoulder pain  EXAM: CHEST  1 VIEW  COMPARISON:  CT chest 09/30/2017  FINDINGS: There is bilateral chronic interstitial lung disease. There is no focal consolidation. There is no pleural effusion or pneumothorax. There is mild stable cardiomegaly. There is thoracic aortic atherosclerosis. There is an S-shaped curvature of the thoracolumbar spine.  There is no acute osseous abnormality.  IMPRESSION: No active disease.  Electronically Signed   By: Kathreen Devoid   On: 08/23/2018 11:30 CT Head Wo Contrast CLINICAL DATA:  Fall 2 days prior. Base of neck, bilateral shoulder and upper back pain.  EXAM: CT HEAD WITHOUT CONTRAST  CT CERVICAL SPINE WITHOUT CONTRAST  TECHNIQUE: Multidetector CT imaging of the head and cervical spine was performed following the standard protocol without intravenous contrast. Multiplanar CT image  reconstructions of the cervical spine were also generated.  COMPARISON:  10/06/2016 brain MRI.  FINDINGS: CT HEAD FINDINGS  Brain: No evidence of parenchymal hemorrhage or extra-axial fluid collection. No mass lesion, mass effect, or midline shift. No CT evidence of acute infarction. Generalized cerebral volume loss. Nonspecific mild subcortical and periventricular white matter hypodensity, most in keeping with chronic small vessel ischemic change. No ventriculomegaly.  Vascular: No acute abnormality.  Skull: No evidence of calvarial fracture.  Sinuses/Orbits: The visualized paranasal sinuses are essentially clear.  Other:  The mastoid air cells are unopacified.  CT CERVICAL SPINE FINDINGS  Alignment: Straightening of the cervical spine. No facet subluxation. Dens is well positioned between the lateral masses of C1. Minimal 2 mm anterolisthesis at C3-4 and C5-6.  Skull base and vertebrae: Nondisplaced fracture of the tip of transverse right T1 process. No cervical spine fracture. No primary bone lesion or focal pathologic process.  Soft tissues and spinal canal: No prevertebral edema. No visible canal hematoma.  Disc levels: Marked multilevel degenerative disc disease throughout the cervical spine, most prominent at C6-7. Advanced bilateral facet arthropathy. Mild degenerative foraminal stenosis on the right at C4-5.  Upper chest: No acute abnormality.  Other: Visualized mastoid air cells appear clear. No discrete thyroid nodules. No pathologically enlarged cervical nodes.  IMPRESSION: 1. No evidence of acute intracranial  abnormality. No evidence of calvarial fracture. 2. Generalized cerebral volume loss and mild chronic small vessel ischemic changes in the cerebral white matter. 3. Nondisplaced fracture of the tip of the right T1 transverse process. No fracture within the cervical spine. No facet subluxation. 4. Marked multilevel degenerative changes in the  cervical spine as detailed.  Electronically Signed   By: Ilona Sorrel M.D.   On: 08/23/2018 11:06 CT Cervical Spine Wo Contrast CLINICAL DATA:  Fall 2 days prior. Base of neck, bilateral shoulder and upper back pain.  EXAM: CT HEAD WITHOUT CONTRAST  CT CERVICAL SPINE WITHOUT CONTRAST  TECHNIQUE: Multidetector CT imaging of the head and cervical spine was performed following the standard protocol without intravenous contrast. Multiplanar CT image reconstructions of the cervical spine were also generated.  COMPARISON:  10/06/2016 brain MRI.  FINDINGS: CT HEAD FINDINGS  Brain: No evidence of parenchymal hemorrhage or extra-axial fluid collection. No mass lesion, mass effect, or midline shift. No CT evidence of acute infarction. Generalized cerebral volume loss. Nonspecific mild subcortical and periventricular white matter hypodensity, most in keeping with chronic small vessel ischemic change. No ventriculomegaly.  Vascular: No acute abnormality.  Skull: No evidence of calvarial fracture.  Sinuses/Orbits: The visualized paranasal sinuses are essentially clear.  Other:  The mastoid air cells are unopacified.  CT CERVICAL SPINE FINDINGS  Alignment: Straightening of the cervical spine. No facet subluxation. Dens is well positioned between the lateral masses of C1. Minimal 2 mm anterolisthesis at C3-4 and C5-6.  Skull base and vertebrae: Nondisplaced fracture of the tip of transverse right T1 process. No cervical spine fracture. No primary bone lesion or focal pathologic process.  Soft tissues and spinal canal: No prevertebral edema. No visible canal hematoma.  Disc levels: Marked multilevel degenerative disc disease throughout the cervical spine, most prominent at C6-7. Advanced bilateral facet arthropathy. Mild degenerative foraminal stenosis on the right at C4-5.  Upper chest: No acute abnormality.  Other: Visualized mastoid air cells appear clear. No  discrete thyroid nodules. No pathologically enlarged cervical nodes.  IMPRESSION: 1. No evidence of acute intracranial abnormality. No evidence of calvarial fracture. 2. Generalized cerebral volume loss and mild chronic small vessel ischemic changes in the cerebral white matter. 3. Nondisplaced fracture of the tip of the right T1 transverse process. No fracture within the cervical spine. No facet subluxation. 4. Marked multilevel degenerative changes in the cervical spine as detailed.  Electronically Signed   By: Ilona Sorrel M.D.   On: 08/23/2018 11:06 Note: Reviewed        Physical Exam  General appearance: Well nourished, well developed, and well hydrated. In no apparent acute distress Mental status: Alert, oriented x 3 (person, place, & time)       Respiratory: No evidence of acute respiratory distress Eyes: PERLA Vitals: BP (!) 172/62   Pulse 67   Temp (!) 97.1 F (36.2 C) (Temporal)   Resp 16   Ht _0  (1.448 m)   Wt 90 lb (40.8 kg)   SpO2 98%   BMI 19.48 kg/m  BMI: Estimated body mass index is 19.48 kg/m as calculated from the following:   Height as of this encounter: _1  (1.448 m).   Weight as of this encounter: 90 lb (40.8 kg). Ideal: Female patients must weigh at least 45.5 kg to calculate ideal body weight   Thoracic Spine Area Exam  Skin & Axial Inspection: No masses, redness, or swelling Alignment: Symmetrical Functional ROM: Decreased ROM Stability: No instability detected Muscle  Tone/Strength: Functionally intact. No obvious neuro-muscular anomalies detected. Sensory (Neurological): Unimpaired Muscle strength & Tone: No palpable anomalies  Lumbar Spine Area Exam  Skin & Axial Inspection: Thoraco-lumbar Scoliosis Alignment: Scoliosis detected Functional ROM: Decreased ROM       Stability: No instability detected Muscle Tone/Strength: Functionally intact. No obvious neuro-muscular anomalies detected. Sensory (Neurological): Musculoskeletal  pain pattern Palpation: Complains of area being tender to palpation        *(Flexion, ABduction and External Rotation)  Gait & Posture Assessment  Ambulation: Patient came in today in a wheel chair Gait: Significantly limited. Dependent on assistive device to ambulate Posture: Difficulty standing up straight, due to pain   Lower Extremity Exam    Side: Right lower extremity  Side: Left lower extremity  Stability: No instability observed          Stability: No instability observed          Skin & Extremity Inspection: Skin color, temperature, and hair growth are WNL. No peripheral edema or cyanosis. No masses, redness, swelling, asymmetry, or associated skin lesions. No contractures.  Skin & Extremity Inspection: Skin color, temperature, and hair growth are WNL. No peripheral edema or cyanosis. No masses, redness, swelling, asymmetry, or associated skin lesions. No contractures.  Functional ROM: Pain restricted ROM for hip and knee joints          Functional ROM: Pain restricted ROM for hip and knee joints          Muscle Tone/Strength: Functionally intact. No obvious neuro-muscular anomalies detected.  Muscle Tone/Strength: Functionally intact. No obvious neuro-muscular anomalies detected.  Sensory (Neurological): Arthropathic arthralgia        Sensory (Neurological): Arthropathic arthralgia        DTR: Patellar: 0: absent Achilles: 0: absent Plantar: deferred today  DTR: Patellar: 0: absent Achilles: 0: absent Plantar: deferred today  Palpation: No palpable anomalies  Palpation: No palpable anomalies     Assessment   Status Diagnosis  Controlled Controlled Controlled 1. Closed fracture of transverse process of thoracic vertebra, sequela   2. Lumbar degenerative disc disease   3. Primary osteoarthritis of left knee   4. DDD (degenerative disc disease), cervical   5. Encounter for long-term opiate analgesic use   6. Primary osteoarthritis of both shoulders   7.  Chronic pain syndrome      Updated Problems: Problem  Chronic Pain Syndrome  Closed Fracture of Transverse Process of Thoracic Vertebra ([place])   Right T1 TP transfer process fracture   Ddd (Degenerative Disc Disease), Cervical  Lumbar Degenerative Disc Disease  Primary Osteoarthritis of Both Shoulders  Encounter for Long-Term Opiate Analgesic Use    Plan of Care   Ms. Terry Abila has a current medication list which includes the following long-term medication(s): caltrate 600+d plus minerals, hydrocodone-acetaminophen, [START ON 03/03/2020] hydrocodone-acetaminophen, [START ON 04/02/2020] hydrocodone-acetaminophen, losartan, metoprolol tartrate, pantoprazole, potassium chloride, and sodium chloride.  Pharmacotherapy (Medications Ordered): Meds ordered this encounter  Medications  . HYDROcodone-acetaminophen (NORCO/VICODIN) 5-325 MG tablet    Sig: Take 1 tablet by mouth daily as needed for moderate pain. For chronic pain syndrome    Dispense:  30 tablet    Refill:  0  . HYDROcodone-acetaminophen (NORCO/VICODIN) 5-325 MG tablet    Sig: Take 1 tablet by mouth daily as needed for moderate pain. For chronic pain syndrome    Dispense:  30 tablet    Refill:  0  . HYDROcodone-acetaminophen (NORCO/VICODIN) 5-325 MG tablet    Sig: Take 1 tablet by mouth  daily as needed for moderate pain. For chronic pain syndrome    Dispense:  30 tablet    Refill:  0   Orders:  Orders Placed This Encounter  Procedures  . ToxASSURE Select 13 (MW), Urine    Volume: 30 ml(s). Minimum 3 ml of urine is needed. Document temperature of fresh sample. Indications: Long term (current) use of opiate analgesic [phone])    Order Specific Question:   Release to patient    Answer:   Immediate   Follow-up plan:   Return in about 3 months (around 05/04/2020) for Medication Management, virtual.    Recent Visits No visits were found meeting these conditions. Showing recent visits within past 90 days and meeting  all other requirements Today's Visits Date Type Provider Dept  02/02/20 Office Visit Gillis Santa, MD [facility]  Showing today's visits and meeting all other requirements Future Appointments No visits were found meeting these conditions. Showing future appointments within next 90 days and meeting all other requirements  I discussed the assessment and treatment plan with the patient. The patient was provided an opportunity to ask questions and all were answered. The patient agreed with the plan and demonstrated an understanding of the instructions.  Patient advised to call back or seek an in-person evaluation if the symptoms or condition worsens.  Duration of encounter:30 minutes.  Note by: Gillis Santa, MD Date: 02/02/2020; Time: 10:11 AM

## 2020-02-02 NOTE — Patient Instructions (Signed)
Three prescriptions for Hydrocodone have been sent to your pharmacy. 

## 2020-02-02 NOTE — Progress Notes (Signed)
Nursing Pain Medication Assessment:  Safety precautions to be maintained throughout the outpatient stay will include: orient to surroundings, keep bed in low position, maintain call bell within reach at all times, provide assistance with transfer out of bed and ambulation.  Medication Inspection Compliance: Ms. Heider did not comply with our request to bring her pills to be counted. She was reminded that bringing the medication bottles, even when empty, is a requirement.  Medication: None brought in. Pill/Patch Count: None available to be counted. Bottle Appearance: No container available. Did not bring bottle(s) to appointment. Filled Date: N/A Last Medication intake:  Yesterday   Pt. Instructed to bring Pill containers to each visit.

## 2020-02-05 LAB — TOXASSURE SELECT 13 (MW), URINE

## 2020-03-24 ENCOUNTER — Encounter: Payer: Self-pay | Admitting: Emergency Medicine

## 2020-03-24 ENCOUNTER — Emergency Department
Admission: EM | Admit: 2020-03-24 | Discharge: 2020-03-24 | Disposition: A | Discharge status: Home / Self Care | Payer: Medicare Other | Attending: Emergency Medicine | Admitting: Emergency Medicine

## 2020-03-24 ENCOUNTER — Other Ambulatory Visit: Payer: Self-pay

## 2020-03-24 DIAGNOSIS — Z79899 Other long term (current) drug therapy: Secondary | ICD-10-CM | POA: Insufficient documentation

## 2020-03-24 DIAGNOSIS — Z7982 Long term (current) use of aspirin: Secondary | ICD-10-CM | POA: Diagnosis not present

## 2020-03-24 DIAGNOSIS — R21 Rash and other nonspecific skin eruption: Secondary | ICD-10-CM | POA: Diagnosis not present

## 2020-03-24 DIAGNOSIS — I10 Essential (primary) hypertension: Secondary | ICD-10-CM | POA: Diagnosis not present

## 2020-03-24 MED ORDER — TRIAMCINOLONE ACETONIDE 0.1 % EX OINT
1.0000 | TOPICAL_OINTMENT | Freq: Two times a day (BID) | CUTANEOUS | 1 refills | Status: DC
Start: 2020-03-24 — End: 2022-01-01

## 2020-03-24 NOTE — ED Triage Notes (Signed)
Pt comes into the ED via POV c/o rash and sores on her head that are uncomfortable.  Pt states they have been present since April.  Pt saw the MD in [facility] regarding them, but the patient states that she never got any medicine for them.

## 2020-03-24 NOTE — ED Notes (Signed)
First RN note:  Pt comes into the ED c/o sores/ rash on her head that is causing her discomfort.

## 2020-03-24 NOTE — Discharge Instructions (Addendum)
The lesions on the scalp that appear acutely infected.  You may use a topical steroid creamto the scalp as needed.  You should consider following up with the dermatologist for further evaluation including skin biopsy.  Follow-up with Dr. Gaynell Face for ongoing symptoms.

## 2020-03-30 NOTE — ED Provider Notes (Signed)
[facility] Emergency Department Provider Note ____________________________________________  Time seen: 1611  I have reviewed the triage vital signs and the nursing notes.  HISTORY  Chief Complaint  Rash  HPI Susan Blackwell is a 86 y.o. female presents herself to the ED accompanied by her friend, for evaluation which she describes a sore on her head.  Patient describes a been present since April, and she does admit to seeing her primary care provider in March, regarding them but she says she never got clear diagnoses or any medication to help treat them.  She had apparently presented to the PCP with some concern over possible parasite infection.  She has had 2 subsequent fecal O&P screens have been negative.  What she describes to her scalp are multiple scabbed lesions that she reports are not painful or itchy.  According to the patient, these lesions have not healed, although they appear to be hypertrophic and nonacute at this time.  Patient denies any ongoing nausea, vomiting, diarrhea.  She did seem to be fixated on the fact that what ever parasites or worms she had noted in her stool, may be causing the rash on her scalp.  Past Medical History:  Diagnosis Date  . Bile duct stone 02/2016  . Hypertension   . PONV (postoperative nausea and vomiting)     Patient Active Problem List   Diagnosis Date Noted  . Chronic pain syndrome 09/25/2018  . Closed fracture of transverse process of thoracic vertebra (HCC) 09/25/2018  . DDD (degenerative disc disease), cervical 09/25/2018  . Lumbar degenerative disc disease 09/25/2018  . Primary osteoarthritis of both shoulders 09/25/2018  . Encounter for long-term opiate analgesic use 09/25/2018  . Anemia 03/25/2016  . Fever   . Bile duct stone 03/11/2016  . HTN (hypertension) 03/10/2016  . Common bile duct calculus 03/10/2016  . Common bile duct dilatation 03/10/2016    Past Surgical History:  Procedure Laterality Date   . BREAST BIOPSY    . CHOLECYSTECTOMY    . ERCP N/A 03/11/2016   Procedure: ENDOSCOPIC RETROGRADE CHOLANGIOPANCREATOGRAPHY (ERCP);  Surgeon: Vida Rigger, MD;  Location: [facility] ENDOSCOPY;  Service: Endoscopy;  Laterality: N/A;  . ERCP N/A 06/18/2016   Procedure: ENDOSCOPIC RETROGRADE CHOLANGIOPANCREATOGRAPHY (ERCP);  Surgeon: Vida Rigger, MD;  Location: [facility];  Service: Endoscopy;  Laterality: N/A;  moved for litho us/LH  . GASTROINTESTINAL STENT REMOVAL N/A 06/18/2016   Procedure: GASTROINTESTINAL STENT REMOVAL;  Surgeon: Vida Rigger, MD;  Location: [facility] ENDOSCOPY;  Service: Endoscopy;  Laterality: N/A;  . SPYGLASS CHOLANGIOSCOPY N/A 06/18/2016   Procedure: [id number] CHOLANGIOSCOPY;  Surgeon: Vida Rigger, MD;  Location: [facility] ENDOSCOPY;  Service: Endoscopy;  Laterality: N/A;  Burman Freestone LITHOTRIPSY N/A 06/18/2016   Procedure: SWHQPRFF LITHOTRIPSY;  Surgeon: Vida Rigger, MD;  Location: [facility] ENDOSCOPY;  Service: Endoscopy;  Laterality: N/A;    Prior to Admission medications   Medication Sig Start Date End Date Taking? Authorizing Provider  acetaminophen (TYLENOL) 650 MG CR tablet Take 650 mg by mouth in the morning and at bedtime.    [provider]  aspirin EC 81 MG tablet Take 81 mg by mouth daily.    [provider]  Calcium Carbonate-Vit D-Min (CALTRATE 600+D PLUS MINERALS) 600-800 MG-UNIT TABS Take 1 tablet by mouth daily.    [provider]  Cholecalciferol 2000 units TABS Take 2,000 Units by mouth daily.     [provider]  Homeopathic Products (ARNICARE) GEL Apply 1 application topically daily as needed (pain).  [provider]  hydrochlorothiazide (HYDRODIURIL) 25 MG tablet Take 25 mg by mouth daily. 03/28/16   [provider]  HYDROcodone-acetaminophen (NORCO/VICODIN) 5-325 MG tablet Take 1 tablet by mouth daily as needed for moderate pain. For chronic pain syndrome 02/02/20 03/03/20  Edward Jolly, MD  HYDROcodone-acetaminophen  (NORCO/VICODIN) 5-325 MG tablet Take 1 tablet by mouth daily as needed for moderate pain. For chronic pain syndrome 03/03/20 04/02/20  Edward Jolly, MD  HYDROcodone-acetaminophen (NORCO/VICODIN) 5-325 MG tablet Take 1 tablet by mouth daily as needed for moderate pain. For chronic pain syndrome 04/02/20 05/02/20  Edward Jolly, MD  losartan (COZAAR) 100 MG tablet Take 100 mg by mouth daily.    [provider]  meclizine (ANTIVERT) 25 MG tablet Take 1 tablet (25 mg total) by mouth 3 (three) times daily as needed for dizziness. 10/06/16   Loren Racer, MD  Menthol-Methyl Salicylate (THERA-GESIC PLUS) CREA Apply 1 application topically daily as needed (pain).     [provider]  metoprolol (LOPRESSOR) 50 MG tablet Take 50 mg by mouth 2 (two) times daily.    [provider]  pantoprazole (PROTONIX) 40 MG tablet Take 40 mg by mouth daily.    [provider]  potassium chloride (K-DUR) 10 MEQ tablet Take 10 mEq by mouth daily.  01/25/16   [provider]  sodium chloride (OCEAN) 0.65 % SOLN nasal spray Place 1 spray into both nostrils as needed for congestion.    [provider]  Tetrahydrozoline HCl (VISINE OP) Apply 1 drop to eye daily as needed (dry eyes).    [provider]  triamcinolone ointment (KENALOG) 0.1 % Apply 1 application topically 2 (two) times daily. 03/24/20   Wynee Matarazzo, Charlesetta Ivory, PA-C  vitamin B-12 (CYANOCOBALAMIN) 1000 MCG tablet Take 1,000 mcg by mouth daily.    [provider]    Allergies Tramadol and Nsaids  History reviewed. No pertinent family history.  Social History Social History   Tobacco Use  . Smoking status: Never Smoker  . Smokeless tobacco: Never Used  Substance Use Topics  . Alcohol use: No  . Drug use: No    Review of Systems  Constitutional: Negative for fever. Eyes: Negative for visual changes. ENT: Negative for sore throat. Cardiovascular: Negative for chest  pain. Respiratory: Negative for shortness of breath. Gastrointestinal: Negative for abdominal pain, vomiting and diarrhea. Genitourinary: Negative for dysuria. Musculoskeletal: Negative for back pain. Skin: Positive for rash. Neurological: Negative for headaches, focal weakness or numbness. ____________________________________________  PHYSICAL EXAM:  VITAL SIGNS: ED Triage Vitals [03/24/20 1506]  Enc Vitals Group     BP (!) 173/63     Pulse Rate 74     Resp 16     Temp 98.2 F (36.8 C)     Temp Source Oral     SpO2 98 %     Weight 92 lb (41.7 kg)     Height 4\' 9"  (1.448 m)     Head Circumference      Peak Flow      Pain Score 0     Pain Loc      Pain Edu?      Excl. in GC?     Constitutional: Alert and oriented. Well appearing and in no distress. Head: Normocephalic and atraumatic. Eyes: Conjunctivae are normal. Normal extraocular movements Neck: Supple. No thyromegaly. Hematological/Lymphatic/Immunological: No cervical lymphadenopathy. Cardiovascular: Normal rate, regular rhythm. Normal distal pulses. Respiratory: Normal respiratory effort. No wheezes/rales/rhonchi. Gastrointestinal: Soft and nontender. No distention. Musculoskeletal:  Nontender with normal range of motion in all extremities.  Neurologic:  Normal gait without ataxia. Normal speech and language. No gross focal neurologic deficits are appreciated. Skin:  Skin is warm, dry and intact. No rash noted.  Patient noted to have 3-5 macular, hypotrophic, hyperpigmented lesions to her scalp.  Skin peeling, induration, purulence, or erythema is noted.  Lesions are nontender to palpation. ____________________________________________  PROCEDURES  Procedures ____________________________________________  INITIAL IMPRESSION / ASSESSMENT AND PLAN / ED COURSE  DDX: actinic keratoses, seborrheic keratoses, solar lentigo, SCC,melanoma  Geriatric patient with ED evaluation of concerning lesions to the scalp that been  present since April.  Patient presents without any signs of acute infectious process of cellulitis.  They also do not appear to be consistent with a contact dermatitis or local allergic reaction.  I advised the patient she follow-up with her primary care provider or be referred to dermatology for further evaluation including skin biopsy if necessary.  She was discharged with a prescription for low-dose steroid cream to use on the scalp as needed.  Return precautions have been discussed.  Jannis Atkins was evaluated in Emergency Department on 03/30/2020 for the symptoms described in the history of present illness. She was evaluated in the context of the global COVID-19 pandemic, which necessitated consideration that the patient might be at risk for infection with the SARS-CoV-2 virus that causes COVID-19. Institutional protocols and algorithms that pertain to the evaluation of patients at risk for COVID-19 are in a state of rapid change based on information released by regulatory bodies including the CDC and federal and state organizations. These policies and algorithms were followed during the patient's care in the ED. ____________________________________________  FINAL CLINICAL IMPRESSION(S) / ED DIAGNOSES  Final diagnoses:  Rash and nonspecific skin eruption      Karmen Stabs, Charlesetta Ivory, PA-C 03/30/20 Rickey Primus    Merwyn Katos, MD 03/31/20 [phone]

## 2020-05-04 ENCOUNTER — Other Ambulatory Visit: Payer: Self-pay

## 2020-05-04 ENCOUNTER — Ambulatory Visit
Payer: Medicare Other | Attending: Student in an Organized Health Care Education/Training Program | Admitting: Student in an Organized Health Care Education/Training Program

## 2020-05-04 ENCOUNTER — Encounter: Payer: Self-pay | Admitting: Student in an Organized Health Care Education/Training Program

## 2020-05-04 DIAGNOSIS — M19012 Primary osteoarthritis, left shoulder: Secondary | ICD-10-CM

## 2020-05-04 DIAGNOSIS — M5136 Other intervertebral disc degeneration, lumbar region: Secondary | ICD-10-CM | POA: Diagnosis not present

## 2020-05-04 DIAGNOSIS — S22009S Unspecified fracture of unspecified thoracic vertebra, sequela: Secondary | ICD-10-CM | POA: Diagnosis not present

## 2020-05-04 DIAGNOSIS — M1712 Unilateral primary osteoarthritis, left knee: Secondary | ICD-10-CM | POA: Diagnosis not present

## 2020-05-04 DIAGNOSIS — M19011 Primary osteoarthritis, right shoulder: Secondary | ICD-10-CM

## 2020-05-04 DIAGNOSIS — M5030 Other cervical disc degeneration, unspecified cervical region: Secondary | ICD-10-CM

## 2020-05-04 DIAGNOSIS — G894 Chronic pain syndrome: Secondary | ICD-10-CM

## 2020-05-04 DIAGNOSIS — Z79891 Long term (current) use of opiate analgesic: Secondary | ICD-10-CM

## 2020-05-04 MED ORDER — HYDROCODONE-ACETAMINOPHEN 5-325 MG PO TABS: 1.0000 | ORAL_TABLET | Freq: Every day | ORAL | 0 refills | Status: DC | PRN

## 2020-05-04 NOTE — Progress Notes (Signed)
Patient: Susan Blackwell  Service Category: E/M  Provider: Gillis Santa, MD  DOB: 1919-12-16  DOS: 05/04/2020  Location: Office  MRN: [id number]  Setting: Ambulatory outpatient  Referring Provider: Kirk Ruths, MD  Type: Established Patient  Specialty: Interventional Pain Management  PCP: Kirk Ruths, MD  Location: Home  Delivery: TeleHealth     Virtual Encounter - Pain Management PROVIDER NOTE: Information contained herein reflects review and annotations entered in association with encounter. Interpretation of such information and data should be left to medically-trained personnel. Information provided to patient can be located elsewhere in the medical record under "Patient Instructions". Document created using STT-dictation technology, any transcriptional errors that may result from process are unintentional.    Contact & Pharmacy Preferred: [place]: [phone] (home) Mobile: [phone] (mobile) E-mail: No e-mail address on record  [place] [place] [facility] [place] [address] [place] [address] Phone: [phone] Fax: [phone]   Pre-screening  Susan Blackwell offered "in-person" vs "virtual" encounter. She indicated preferring virtual for this encounter.   Reason COVID-19*  Social distancing based on CDC and AMA recommendations.   I contacted Susan Blackwell on 05/04/2020 via telephone.      I clearly identified myself as Gillis Santa, MD. I verified that I was speaking with the correct person using two identifiers (Name: Susan Blackwell, and date of birth: 07/26/1919).  Consent I sought verbal advanced consent from Susan Blackwell for virtual visit interactions. I informed Susan Blackwell of possible security and privacy concerns, risks, and limitations associated with providing "not-in-person" medical evaluation and management services. I also informed Susan Blackwell of the availability of "in-person" appointments. Finally, I informed  her that there would be a charge for the virtual visit and that she could be  personally, fully or partially, financially responsible for it. Susan Blackwell expressed understanding and agreed to proceed.   Historic Elements   Susan Blackwell is a 87 y.o. year old, female patient evaluated today after our last contact on 02/02/2020. Susan Blackwell  has a past medical history of Bile duct stone (02/2016), Hypertension, and PONV (postoperative nausea and vomiting). She also  has a past surgical history that includes Cholecystectomy; ERCP (N/A, 03/11/2016); Breast biopsy; ERCP (N/A, 06/18/2016); Gastrointestinal stent removal (N/A, 06/18/2016); Spyglass cholangioscopy (N/A, 06/18/2016); and spyglass lithotripsy (N/A, 06/18/2016). Susan Blackwell has a current medication list which includes the following prescription(s): acetaminophen, aspirin ec, caltrate 600+d plus minerals, cholecalciferol, arnicare, hydrochlorothiazide, losartan, meclizine, thera-gesic plus, metoprolol tartrate, pantoprazole, potassium chloride, sodium chloride, tetrahydrozoline hcl, triamcinolone ointment, vitamin b-12, hydrocodone-acetaminophen, [START ON 06/03/2020] hydrocodone-acetaminophen, and [START ON 07/03/2020] hydrocodone-acetaminophen, and the following Facility-Administered Medications: cefoTEtan (CEFOTAN) 1 g in dextrose 5 % 50 mL IVPB. She  reports that she has never smoked. She has never used smokeless tobacco. She reports that she does not drink alcohol and does not use drugs. Susan Blackwell is allergic to tramadol and nsaids.   HPI  Today, she is being contacted for medication management.  No change in medical history since last visit. States that she isn't feeling very well today. Fatigued and somewhat dizzy.  Patient's pain is at baseline.  Patient continues pain regimen as prescribed.  States that it provides pain relief.  04/02/2020  02/02/2020   1  Hydrocodone-Acetamin 5-325 Mg  30.00  30  Bi Lat  [id number]  Gib (4800)  0/0  5.00 MME   Private Pay  [employer]   Pharmacotherapy Assessment  Analgesic: Hydrocodone 5 mg daily as needed, quantity 30/month; MME  5    Monitoring: [place] PMP: PDMP reviewed during this encounter.       Pharmacotherapy: No side-effects or adverse reactions reported. Compliance: No problems identified. Effectiveness: Clinically acceptable. Plan: Refer to "POC".  UDS:  Summary  Date Value Ref Range Status  02/02/2020 Note  Final    Comment:    ==================================================================== [employer] Select 13 (MW) ==================================================================== Test                             Result       Flag       Units  Drug Present and Declared for Prescription Verification   Hydrocodone                    672          EXPECTED   ng/mg creat   Hydromorphone                  231          EXPECTED   ng/mg creat   Dihydrocodeine                 122          EXPECTED   ng/mg creat   Norhydrocodone                 922          EXPECTED   ng/mg creat    Sources of hydrocodone include scheduled prescription medications.    Hydromorphone, dihydrocodeine and norhydrocodone are expected    metabolites of hydrocodone. Hydromorphone and dihydrocodeine are    also available as scheduled prescription medications.  ==================================================================== Test                      Result    Flag   Units      Ref Range   Creatinine              64               mg/dL      >=20 ==================================================================== Declared Medications:  The flagging and interpretation on this report are based on the  following declared medications.  Unexpected results may arise from  inaccuracies in the declared medications.   **Note: The testing scope of this panel includes these medications:   Hydrocodone (Norco)   **Note: The testing scope of this panel does not include the  following reported medications:    Acetaminophen (Tylenol)  Acetaminophen (Norco)  Aspirin  Calcium  Cyanocobalamin  Eye Drops  Hydrochlorothiazide (Hydrodiuril)  Losartan (Cozaar)  Meclizine (Antivert)  Menthol  Metoprolol (Lopressor)  Pantoprazole (Protonix)  Potassium (Klor-Con)  Sodium Chloride  Topical  Vitamin D ==================================================================== For clinical consultation, please call [phone]. ====================================================================     Laboratory Chemistry Profile   Renal Lab Results  Component Value Date   BUN 13 09/02/2019   CREATININE 0.96 09/02/2019   GFRAA 57 (L) 09/02/2019   GFRNONAA 50 (L) 09/02/2019     Hepatic Lab Results  Component Value Date   AST 22 09/02/2019   ALT 19 09/02/2019   ALBUMIN 3.8 09/02/2019   ALKPHOS 56 09/02/2019   LIPASE 30 10/06/2016     Electrolytes Lab Results  Component Value Date   NA 136 09/02/2019   K 3.6 09/02/2019   CL 104 09/02/2019   CALCIUM 9.1 09/02/2019     Bone No  results found for: VD25OH, [id number], [id number], [id number], 25OHVITD1, 25OHVITD2, 25OHVITD3, TESTOFREE, TESTOSTERONE   Inflammation (CRP: Acute Phase) (ESR: Chronic Phase) Lab Results  Component Value Date   LATICACIDVEN 1.4 09/02/2019       Note: Above Lab results reviewed.  Imaging  CT Thoracic Spine Wo Contrast CLINICAL DATA:  Fall 2 days ago with back pain bilateral shoulder pain since a fall.  EXAM: CT THORACIC SPINE WITHOUT CONTRAST  TECHNIQUE: Multidetector CT images of the thoracic were obtained using the standard protocol without intravenous contrast.  COMPARISON:  None.  FINDINGS: Alignment: No traumatic malalignment. Mild degenerative thoracic dextrocurvature.  Vertebrae: Negative for vertebral fracture. The posterior left eighth rib is fractured on a chronic basis when compared with 09/30/2017 chest CT.  Paraspinal and other soft tissues: No acute finding in the lungs when  allowing for motion artifact. There is trace pleural fluid or pleural thickening in the posterior left chest. Atherosclerosis.  Disc levels: Diffuse degenerative disc narrowing and endplate spurring  IMPRESSION: No acute finding.  Negative for thoracic spine fracture.  Electronically Signed   By: Monte Fantasia M.D.   On: 08/23/2018 12:23 DG Thoracic Spine 2 View CLINICAL DATA:  [place] 2 days ago.  Severe upper back pain.  EXAM: THORACIC SPINE 2 VIEWS  COMPARISON:  Chest CT 09/30/2017  FINDINGS: Mild S-shaped scoliosis in the spine. Dextroscoliosis of the thoracic spine with levoscoliosis of the upper lumbar spine. Vertebral body heights are maintained. No evidence for a compression fracture. Atherosclerotic calcifications in the aorta.  IMPRESSION: 1. No acute abnormality in the thoracic spine. 2. Scoliosis involving the thoracic and lumbar spine.  Electronically Signed   By: Markus Daft M.D.   On: 08/23/2018 11:36 DG Shoulder Right CLINICAL DATA:  Status post fall, bilateral shoulder pain  EXAM: RIGHT SHOULDER - 2+ VIEW  COMPARISON:  Chest x-ray 03/14/2016  FINDINGS: Generalized osteopenia. No acute fracture or dislocation. Mild osteoarthritis of the glenohumeral joint. Mild arthropathy of the acromioclavicular joint. No aggressive osseous lesion.  IMPRESSION: No acute osseous injury of the right shoulder.  Electronically Signed   By: Kathreen Devoid   On: 08/23/2018 11:33 DG Shoulder Left CLINICAL DATA:  Status post fall, bilateral shoulder pain  EXAM: LEFT SHOULDER - 2+ VIEW  COMPARISON:  Chest x-ray 03/14/2016  FINDINGS: Generalized osteopenia. No acute fracture or dislocation. No aggressive osseous lesion. Mild osteoarthritis of the glenohumeral joint. Mild arthropathy of the acromioclavicular joint. Chondrocalcinosis of glenohumeral joint as can be seen with CPPD.  IMPRESSION: 1.  No acute osseous injury of the left shoulder. 2. Mild  osteoarthritis of the left glenohumeral joint.  Electronically Signed   By: Kathreen Devoid   On: 08/23/2018 11:32 DG Chest 1 View CLINICAL DATA:  Status post fall, back pain, shoulder pain  EXAM: CHEST  1 VIEW  COMPARISON:  CT chest 09/30/2017  FINDINGS: There is bilateral chronic interstitial lung disease. There is no focal consolidation. There is no pleural effusion or pneumothorax. There is mild stable cardiomegaly. There is thoracic aortic atherosclerosis. There is an S-shaped curvature of the thoracolumbar spine.  There is no acute osseous abnormality.  IMPRESSION: No active disease.  Electronically Signed   By: Kathreen Devoid   On: 08/23/2018 11:30 CT Head Wo Contrast CLINICAL DATA:  Fall 2 days prior. Base of neck, bilateral shoulder and upper back pain.  EXAM: CT HEAD WITHOUT CONTRAST  CT CERVICAL SPINE WITHOUT CONTRAST  TECHNIQUE: Multidetector CT imaging of the head and cervical spine was performed  following the standard protocol without intravenous contrast. Multiplanar CT image reconstructions of the cervical spine were also generated.  COMPARISON:  10/06/2016 brain MRI.  FINDINGS: CT HEAD FINDINGS  Brain: No evidence of parenchymal hemorrhage or extra-axial fluid collection. No mass lesion, mass effect, or midline shift. No CT evidence of acute infarction. Generalized cerebral volume loss. Nonspecific mild subcortical and periventricular white matter hypodensity, most in keeping with chronic small vessel ischemic change. No ventriculomegaly.  Vascular: No acute abnormality.  Skull: No evidence of calvarial fracture.  Sinuses/Orbits: The visualized paranasal sinuses are essentially clear.  Other:  The mastoid air cells are unopacified.  CT CERVICAL SPINE FINDINGS  Alignment: Straightening of the cervical spine. No facet subluxation. Dens is well positioned between the lateral masses of C1. Minimal 2 mm anterolisthesis at C3-4 and  C5-6.  Skull base and vertebrae: Nondisplaced fracture of the tip of transverse right T1 process. No cervical spine fracture. No primary bone lesion or focal pathologic process.  Soft tissues and spinal canal: No prevertebral edema. No visible canal hematoma.  Disc levels: Marked multilevel degenerative disc disease throughout the cervical spine, most prominent at C6-7. Advanced bilateral facet arthropathy. Mild degenerative foraminal stenosis on the right at C4-5.  Upper chest: No acute abnormality.  Other: Visualized mastoid air cells appear clear. No discrete thyroid nodules. No pathologically enlarged cervical nodes.  IMPRESSION: 1. No evidence of acute intracranial abnormality. No evidence of calvarial fracture. 2. Generalized cerebral volume loss and mild chronic small vessel ischemic changes in the cerebral white matter. 3. Nondisplaced fracture of the tip of the right T1 transverse process. No fracture within the cervical spine. No facet subluxation. 4. Marked multilevel degenerative changes in the cervical spine as detailed.  Electronically Signed   By: Ilona Sorrel M.D.   On: 08/23/2018 11:06 CT Cervical Spine Wo Contrast CLINICAL DATA:  Fall 2 days prior. Base of neck, bilateral shoulder and upper back pain.  EXAM: CT HEAD WITHOUT CONTRAST  CT CERVICAL SPINE WITHOUT CONTRAST  TECHNIQUE: Multidetector CT imaging of the head and cervical spine was performed following the standard protocol without intravenous contrast. Multiplanar CT image reconstructions of the cervical spine were also generated.  COMPARISON:  10/06/2016 brain MRI.  FINDINGS: CT HEAD FINDINGS  Brain: No evidence of parenchymal hemorrhage or extra-axial fluid collection. No mass lesion, mass effect, or midline shift. No CT evidence of acute infarction. Generalized cerebral volume loss. Nonspecific mild subcortical and periventricular white matter hypodensity, most in keeping with  chronic small vessel ischemic change. No ventriculomegaly.  Vascular: No acute abnormality.  Skull: No evidence of calvarial fracture.  Sinuses/Orbits: The visualized paranasal sinuses are essentially clear.  Other:  The mastoid air cells are unopacified.  CT CERVICAL SPINE FINDINGS  Alignment: Straightening of the cervical spine. No facet subluxation. Dens is well positioned between the lateral masses of C1. Minimal 2 mm anterolisthesis at C3-4 and C5-6.  Skull base and vertebrae: Nondisplaced fracture of the tip of transverse right T1 process. No cervical spine fracture. No primary bone lesion or focal pathologic process.  Soft tissues and spinal canal: No prevertebral edema. No visible canal hematoma.  Disc levels: Marked multilevel degenerative disc disease throughout the cervical spine, most prominent at C6-7. Advanced bilateral facet arthropathy. Mild degenerative foraminal stenosis on the right at C4-5.  Upper chest: No acute abnormality.  Other: Visualized mastoid air cells appear clear. No discrete thyroid nodules. No pathologically enlarged cervical nodes.  IMPRESSION: 1. No evidence of acute intracranial abnormality. No  evidence of calvarial fracture. 2. Generalized cerebral volume loss and mild chronic small vessel ischemic changes in the cerebral white matter. 3. Nondisplaced fracture of the tip of the right T1 transverse process. No fracture within the cervical spine. No facet subluxation. 4. Marked multilevel degenerative changes in the cervical spine as detailed.  Electronically Signed   By: Ilona Sorrel M.D.   On: 08/23/2018 11:06  Assessment  The primary encounter diagnosis was Closed fracture of transverse process of thoracic vertebra, sequela. Diagnoses of Lumbar degenerative disc disease, Primary osteoarthritis of left knee, DDD (degenerative disc disease), cervical, Primary osteoarthritis of both shoulders, Encounter for long-term opiate  analgesic use, and Chronic pain syndrome were also pertinent to this visit.  Plan of Care   Ms. Cendy Oconnor has a current medication list which includes the following long-term medication(s): caltrate 600+d plus minerals, losartan, metoprolol tartrate, pantoprazole, potassium chloride, sodium chloride, hydrocodone-acetaminophen, [START ON 06/03/2020] hydrocodone-acetaminophen, and [START ON 07/03/2020] hydrocodone-acetaminophen.  Pharmacotherapy (Medications Ordered): Meds ordered this encounter  Medications  . HYDROcodone-acetaminophen (NORCO/VICODIN) 5-325 MG tablet    Sig: Take 1 tablet by mouth daily as needed for moderate pain. For chronic pain syndrome    Dispense:  30 tablet    Refill:  0  . HYDROcodone-acetaminophen (NORCO/VICODIN) 5-325 MG tablet    Sig: Take 1 tablet by mouth daily as needed for moderate pain. For chronic pain syndrome    Dispense:  30 tablet    Refill:  0  . HYDROcodone-acetaminophen (NORCO/VICODIN) 5-325 MG tablet    Sig: Take 1 tablet by mouth daily as needed for moderate pain. For chronic pain syndrome    Dispense:  30 tablet    Refill:  0    Follow-up plan:   Return in about 3 months (around 08/02/2020) for Medication Management, in person.   Recent Visits No visits were found meeting these conditions. Showing recent visits within past 90 days and meeting all other requirements Today's Visits Date Type Provider Dept  05/04/20 Telemedicine Gillis Santa, MD [facility]  Showing today's visits and meeting all other requirements Future Appointments No visits were found meeting these conditions. Showing future appointments within next 90 days and meeting all other requirements  I discussed the assessment and treatment plan with the patient. The patient was provided an opportunity to ask questions and all were answered. The patient agreed with the plan and demonstrated an understanding of the instructions.  Patient advised to call back or  seek an in-person evaluation if the symptoms or condition worsens.  Duration of encounter: 58mnutes.  Note by: BGillis Santa MD Date: 05/04/2020; Time: 1:56 PM

## 2020-08-02 ENCOUNTER — Other Ambulatory Visit: Payer: Self-pay

## 2020-08-02 ENCOUNTER — Encounter: Payer: Self-pay | Admitting: Student in an Organized Health Care Education/Training Program

## 2020-08-02 ENCOUNTER — Other Ambulatory Visit: Payer: Self-pay | Admitting: Student in an Organized Health Care Education/Training Program

## 2020-08-02 ENCOUNTER — Ambulatory Visit
Payer: Medicare Other | Attending: Student in an Organized Health Care Education/Training Program | Admitting: Student in an Organized Health Care Education/Training Program

## 2020-08-02 VITALS — BP 171/92 | HR 73 | Temp 97.4°F | Resp 16 | Ht <= 58 in | Wt 92.0 lb

## 2020-08-02 DIAGNOSIS — G894 Chronic pain syndrome: Secondary | ICD-10-CM | POA: Insufficient documentation

## 2020-08-02 DIAGNOSIS — M5136 Other intervertebral disc degeneration, lumbar region: Secondary | ICD-10-CM | POA: Insufficient documentation

## 2020-08-02 DIAGNOSIS — Z79891 Long term (current) use of opiate analgesic: Secondary | ICD-10-CM | POA: Diagnosis not present

## 2020-08-02 DIAGNOSIS — S22009S Unspecified fracture of unspecified thoracic vertebra, sequela: Secondary | ICD-10-CM | POA: Diagnosis not present

## 2020-08-02 DIAGNOSIS — M19011 Primary osteoarthritis, right shoulder: Secondary | ICD-10-CM | POA: Diagnosis present

## 2020-08-02 DIAGNOSIS — M19012 Primary osteoarthritis, left shoulder: Secondary | ICD-10-CM | POA: Diagnosis present

## 2020-08-02 DIAGNOSIS — M5030 Other cervical disc degeneration, unspecified cervical region: Secondary | ICD-10-CM | POA: Insufficient documentation

## 2020-08-02 DIAGNOSIS — M1712 Unilateral primary osteoarthritis, left knee: Secondary | ICD-10-CM | POA: Diagnosis not present

## 2020-08-02 MED ORDER — HYDROCODONE-ACETAMINOPHEN 5-325 MG PO TABS: 1.0000 | ORAL_TABLET | Freq: Every day | ORAL | 0 refills | Status: DC | PRN

## 2020-08-02 NOTE — Progress Notes (Signed)
Nursing Pain Medication Assessment:  Safety precautions to be maintained throughout the outpatient stay will include: orient to surroundings, keep bed in low position, maintain call bell within reach at all times, provide assistance with transfer out of bed and ambulation.  Medication Inspection Compliance: Susan Blackwell did not comply with our request to bring her pills to be counted. She was reminded that bringing the medication bottles, even when empty, is a requirement.  Medication: None brought in. Pill/Patch Count: None available to be counted. Bottle Appearance: No container available. Did not bring bottle(s) to appointment. Filled Date: N/A Last Medication intake:  Today

## 2020-08-02 NOTE — Progress Notes (Signed)
PROVIDER NOTE: Information contained herein reflects review and annotations entered in association with encounter. Interpretation of such information and data should be left to medically-trained personnel. Information provided to patient can be located elsewhere in the medical record under "Patient Instructions". Document created using STT-dictation technology, any transcriptional errors that may result from process are unintentional.    Patient: Susan Blackwell  Service Category: E/M  Provider: Gillis Santa, MD  DOB: [birth year]  DOS: 08/02/2020  Specialty: Interventional Pain Management  MRN: [id number]  Setting: Ambulatory outpatient  PCP: Kirk Ruths, MD  Type: Established Patient    Referring Provider: Kirk Ruths, MD  Location: Office  Delivery: Face-to-face     HPI  Ms. Roshell Brigham, a 87 y.o. year old female, is here today because of her Closed fracture of transverse process of thoracic vertebra, sequela [S22.009S]. Ms. Bassin primary complain today is Hip Pain (bilateral)  Pertinent problems: Ms. Pickney has Chronic pain syndrome; Closed fracture of transverse process of thoracic vertebra ([place]); DDD (degenerative disc disease), cervical; Lumbar degenerative disc disease; Primary osteoarthritis of both shoulders; and Encounter for long-term opiate analgesic use on their pertinent problem list. Pain Assessment: Severity of Chronic pain is reported as a 8 /10. Location: Hip Right,Left/both legs. Onset: More than a month ago. Quality: Other (Comment). Timing: Intermittent. Modifying factor(s): medications, heat. Vitals:  height is _0  (1.448 m) and weight is 92 lb (41.7 kg). Her temporal temperature is 97.4 F (36.3 C) (abnormal). Her blood pressure is 171/92 (abnormal) and her pulse is 73. Her respiration is 16 and oxygen saturation is 100%.   Reason for encounter: medication management.    No change in medical history since last visit.  Patient's pain is at baseline.   Patient continues multimodal pain regimen as prescribed.  States that it provides pain relief and improvement in functional status.   Pharmacotherapy Assessment   Analgesic: Hydrocodone 5 mg daily as needed, quantity 30/month; MME 5    Monitoring: [place] PMP: PDMP reviewed during this encounter.       Pharmacotherapy: No side-effects or adverse reactions reported. Compliance: No problems identified. Effectiveness: Clinically acceptable.  Landis Martins, RN  08/02/2020 12:53 PM  Sign when Signing Visit Nursing Pain Medication Assessment:  Safety precautions to be maintained throughout the outpatient stay will include: orient to surroundings, keep bed in low position, maintain call bell within reach at all times, provide assistance with transfer out of bed and ambulation.  Medication Inspection Compliance: Ms. Lyter did not comply with our request to bring her pills to be counted. She was reminded that bringing the medication bottles, even when empty, is a requirement.  Medication: None brought in. Pill/Patch Count: None available to be counted. Bottle Appearance: No container available. Did not bring bottle(s) to appointment. Filled Date: N/A Last Medication intake:  Today    UDS:  Summary  Date Value Ref Range Status  02/02/2020 Note  Final    Comment:    ==================================================================== [facility] (MW) ==================================================================== Test                             Result       Flag       Units  Drug Present and Declared for Prescription Verification   Hydrocodone                    672          EXPECTED  ng/mg creat   Hydromorphone                  231          EXPECTED   ng/mg creat   Dihydrocodeine                 122          EXPECTED   ng/mg creat   Norhydrocodone                 922          EXPECTED   ng/mg creat    Sources of hydrocodone include scheduled prescription medications.     Hydromorphone, dihydrocodeine and norhydrocodone are expected    metabolites of hydrocodone. Hydromorphone and dihydrocodeine are    also available as scheduled prescription medications.  ==================================================================== Test                      Result    Flag   Units      Ref Range   Creatinine              64               mg/dL      >=20 ==================================================================== Declared Medications:  The flagging and interpretation on this report are based on the  following declared medications.  Unexpected results may arise from  inaccuracies in the declared medications.   **Note: The testing scope of this panel includes these medications:   Hydrocodone (Norco)   **Note: The testing scope of this panel does not include the  following reported medications:   Acetaminophen (Tylenol)  Acetaminophen (Norco)  Aspirin  Calcium  Cyanocobalamin  Eye Drops  Hydrochlorothiazide (Hydrodiuril)  Losartan (Cozaar)  Meclizine (Antivert)  Menthol  Metoprolol (Lopressor)  Pantoprazole (Protonix)  Potassium (Klor-Con)  Sodium Chloride  Topical  Vitamin D ==================================================================== For clinical consultation, please call [phone]. ====================================================================      ROS  Constitutional: Denies any fever or chills Gastrointestinal: No reported hemesis, hematochezia, vomiting, or acute GI distress Musculoskeletal: Denies any acute onset joint swelling, redness, loss of ROM, or weakness Neurological: No reported episodes of acute onset apraxia, aphasia, dysarthria, agnosia, amnesia, paralysis, loss of coordination, or loss of consciousness  Medication Review  Acetaminophen, Arnicare, Caltrate 600+D Plus Minerals, Cholecalciferol, HYDROcodone-acetaminophen, Tetrahydrozoline HCl, fluticasone, gentamicin cream, hydrochlorothiazide, losartan,  meclizine, metoprolol tartrate, pantoprazole, potassium chloride, sodium chloride, triamcinolone ointment, and vitamin B-12  History Review  Allergy: Ms. Withington is allergic to tramadol and nsaids. Drug: Ms. Constantine  reports no history of drug use. Alcohol:  reports no history of alcohol use. Tobacco:  reports that she has never smoked. She has never used smokeless tobacco. Social: Ms. Bernstein  reports that she has never smoked. She has never used smokeless tobacco. She reports that she does not drink alcohol and does not use drugs. Medical:  has a past medical history of Bile duct stone (02/2016), Hypertension, and PONV (postoperative nausea and vomiting). Surgical: Ms. Lemaster  has a past surgical history that includes Cholecystectomy; ERCP (N/A, 03/11/2016); Breast biopsy; ERCP (N/A, 06/18/2016); Gastrointestinal stent removal (N/A, 06/18/2016); Spyglass cholangioscopy (N/A, 06/18/2016); and spyglass lithotripsy (N/A, 06/18/2016). Family: family history is not on file.  Laboratory Chemistry Profile   Renal Lab Results  Component Value Date   BUN 13 09/02/2019   CREATININE 0.96 09/02/2019   GFRAA 57 (L) 09/02/2019   GFRNONAA 50 (L) 09/02/2019  Hepatic Lab Results  Component Value Date   AST 22 09/02/2019   ALT 19 09/02/2019   ALBUMIN 3.8 09/02/2019   ALKPHOS 56 09/02/2019   LIPASE 30 10/06/2016     Electrolytes Lab Results  Component Value Date   NA 136 09/02/2019   K 3.6 09/02/2019   CL 104 09/02/2019   CALCIUM 9.1 09/02/2019     Bone No results found for: VD25OH, [id number], [id number], OM7672CN4, 25OHVITD1, 25OHVITD2, 25OHVITD3, TESTOFREE, TESTOSTERONE   Inflammation (CRP: Acute Phase) (ESR: Chronic Phase) Lab Results  Component Value Date   LATICACIDVEN 1.4 09/02/2019       Note: Above Lab results reviewed.  Recent Imaging Review  CT Thoracic Spine Wo Contrast CLINICAL DATA:  Fall 2 days ago with back pain bilateral shoulder pain since a fall.  EXAM: CT  THORACIC SPINE WITHOUT CONTRAST  TECHNIQUE: Multidetector CT images of the thoracic were obtained using the standard protocol without intravenous contrast.  COMPARISON:  None.  FINDINGS: Alignment: No traumatic malalignment. Mild degenerative thoracic dextrocurvature.  Vertebrae: Negative for vertebral fracture. The posterior left eighth rib is fractured on a chronic basis when compared with 09/30/2017 chest CT.  Paraspinal and other soft tissues: No acute finding in the lungs when allowing for motion artifact. There is trace pleural fluid or pleural thickening in the posterior left chest. Atherosclerosis.  Disc levels: Diffuse degenerative disc narrowing and endplate spurring  IMPRESSION: No acute finding.  Negative for thoracic spine fracture.  Electronically Signed   By: Monte Fantasia M.D.   On: 08/23/2018 12:23 DG Thoracic Spine 2 View CLINICAL DATA:  [place] 2 days ago.  Severe upper back pain.  EXAM: THORACIC SPINE 2 VIEWS  COMPARISON:  Chest CT 09/30/2017  FINDINGS: Mild S-shaped scoliosis in the spine. Dextroscoliosis of the thoracic spine with levoscoliosis of the upper lumbar spine. Vertebral body heights are maintained. No evidence for a compression fracture. Atherosclerotic calcifications in the aorta.  IMPRESSION: 1. No acute abnormality in the thoracic spine. 2. Scoliosis involving the thoracic and lumbar spine.  Electronically Signed   By: Markus Daft M.D.   On: 08/23/2018 11:36 DG Shoulder Right CLINICAL DATA:  Status post fall, bilateral shoulder pain  EXAM: RIGHT SHOULDER - 2+ VIEW  COMPARISON:  Chest x-ray 03/14/2016  FINDINGS: Generalized osteopenia. No acute fracture or dislocation. Mild osteoarthritis of the glenohumeral joint. Mild arthropathy of the acromioclavicular joint. No aggressive osseous lesion.  IMPRESSION: No acute osseous injury of the right shoulder.  Electronically Signed   By: Kathreen Devoid   On: 08/23/2018  11:33 DG Shoulder Left CLINICAL DATA:  Status post fall, bilateral shoulder pain  EXAM: LEFT SHOULDER - 2+ VIEW  COMPARISON:  Chest x-ray 03/14/2016  FINDINGS: Generalized osteopenia. No acute fracture or dislocation. No aggressive osseous lesion. Mild osteoarthritis of the glenohumeral joint. Mild arthropathy of the acromioclavicular joint. Chondrocalcinosis of glenohumeral joint as can be seen with CPPD.  IMPRESSION: 1.  No acute osseous injury of the left shoulder. 2. Mild osteoarthritis of the left glenohumeral joint.  Electronically Signed   By: Kathreen Devoid   On: 08/23/2018 11:32 DG Chest 1 View CLINICAL DATA:  Status post fall, back pain, shoulder pain  EXAM: CHEST  1 VIEW  COMPARISON:  CT chest 09/30/2017  FINDINGS: There is bilateral chronic interstitial lung disease. There is no focal consolidation. There is no pleural effusion or pneumothorax. There is mild stable cardiomegaly. There is thoracic aortic atherosclerosis. There is an S-shaped curvature of the thoracolumbar spine.  There is no acute osseous abnormality.  IMPRESSION: No active disease.  Electronically Signed   By: Kathreen Devoid   On: 08/23/2018 11:30 CT Head Wo Contrast CLINICAL DATA:  Fall 2 days prior. Base of neck, bilateral shoulder and upper back pain.  EXAM: CT HEAD WITHOUT CONTRAST  CT CERVICAL SPINE WITHOUT CONTRAST  TECHNIQUE: Multidetector CT imaging of the head and cervical spine was performed following the standard protocol without intravenous contrast. Multiplanar CT image reconstructions of the cervical spine were also generated.  COMPARISON:  10/06/2016 brain MRI.  FINDINGS: CT HEAD FINDINGS  Brain: No evidence of parenchymal hemorrhage or extra-axial fluid collection. No mass lesion, mass effect, or midline shift. No CT evidence of acute infarction. Generalized cerebral volume loss. Nonspecific mild subcortical and periventricular white matter hypodensity, most  in keeping with chronic small vessel ischemic change. No ventriculomegaly.  Vascular: No acute abnormality.  Skull: No evidence of calvarial fracture.  Sinuses/Orbits: The visualized paranasal sinuses are essentially clear.  Other:  The mastoid air cells are unopacified.  CT CERVICAL SPINE FINDINGS  Alignment: Straightening of the cervical spine. No facet subluxation. Dens is well positioned between the lateral masses of C1. Minimal 2 mm anterolisthesis at C3-4 and C5-6.  Skull base and vertebrae: Nondisplaced fracture of the tip of transverse right T1 process. No cervical spine fracture. No primary bone lesion or focal pathologic process.  Soft tissues and spinal canal: No prevertebral edema. No visible canal hematoma.  Disc levels: Marked multilevel degenerative disc disease throughout the cervical spine, most prominent at C6-7. Advanced bilateral facet arthropathy. Mild degenerative foraminal stenosis on the right at C4-5.  Upper chest: No acute abnormality.  Other: Visualized mastoid air cells appear clear. No discrete thyroid nodules. No pathologically enlarged cervical nodes.  IMPRESSION: 1. No evidence of acute intracranial abnormality. No evidence of calvarial fracture. 2. Generalized cerebral volume loss and mild chronic small vessel ischemic changes in the cerebral white matter. 3. Nondisplaced fracture of the tip of the right T1 transverse process. No fracture within the cervical spine. No facet subluxation. 4. Marked multilevel degenerative changes in the cervical spine as detailed.  Electronically Signed   By: Ilona Sorrel M.D.   On: 08/23/2018 11:06 CT Cervical Spine Wo Contrast CLINICAL DATA:  Fall 2 days prior. Base of neck, bilateral shoulder and upper back pain.  EXAM: CT HEAD WITHOUT CONTRAST  CT CERVICAL SPINE WITHOUT CONTRAST  TECHNIQUE: Multidetector CT imaging of the head and cervical spine was performed following the standard  protocol without intravenous contrast. Multiplanar CT image reconstructions of the cervical spine were also generated.  COMPARISON:  10/06/2016 brain MRI.  FINDINGS: CT HEAD FINDINGS  Brain: No evidence of parenchymal hemorrhage or extra-axial fluid collection. No mass lesion, mass effect, or midline shift. No CT evidence of acute infarction. Generalized cerebral volume loss. Nonspecific mild subcortical and periventricular white matter hypodensity, most in keeping with chronic small vessel ischemic change. No ventriculomegaly.  Vascular: No acute abnormality.  Skull: No evidence of calvarial fracture.  Sinuses/Orbits: The visualized paranasal sinuses are essentially clear.  Other:  The mastoid air cells are unopacified.  CT CERVICAL SPINE FINDINGS  Alignment: Straightening of the cervical spine. No facet subluxation. Dens is well positioned between the lateral masses of C1. Minimal 2 mm anterolisthesis at C3-4 and C5-6.  Skull base and vertebrae: Nondisplaced fracture of the tip of transverse right T1 process. No cervical spine fracture. No primary bone lesion or focal pathologic process.  Soft tissues and spinal  canal: No prevertebral edema. No visible canal hematoma.  Disc levels: Marked multilevel degenerative disc disease throughout the cervical spine, most prominent at C6-7. Advanced bilateral facet arthropathy. Mild degenerative foraminal stenosis on the right at C4-5.  Upper chest: No acute abnormality.  Other: Visualized mastoid air cells appear clear. No discrete thyroid nodules. No pathologically enlarged cervical nodes.  IMPRESSION: 1. No evidence of acute intracranial abnormality. No evidence of calvarial fracture. 2. Generalized cerebral volume loss and mild chronic small vessel ischemic changes in the cerebral white matter. 3. Nondisplaced fracture of the tip of the right T1 transverse process. No fracture within the cervical spine. No  facet subluxation. 4. Marked multilevel degenerative changes in the cervical spine as detailed.  Electronically Signed   By: Ilona Sorrel M.D.   On: 08/23/2018 11:06 Note: Reviewed        Physical Exam  General appearance: Well nourished, well developed, and well hydrated. In no apparent acute distress Mental status: Alert, oriented x 3 (person, place, & time)       Respiratory: No evidence of acute respiratory distress Eyes: PERLA Vitals: BP (!) 171/92   Pulse 73   Temp (!) 97.4 F (36.3 C) (Temporal)   Resp 16   Ht _0  (1.448 m)   Wt 92 lb (41.7 kg)   SpO2 100%   BMI 19.91 kg/m  BMI: Estimated body mass index is 19.91 kg/m as calculated from the following:   Height as of this encounter: _1  (1.448 m).   Weight as of this encounter: 92 lb (41.7 kg). Ideal: Female patients must weigh at least 45.5 kg to calculate ideal body weight   Lumbar Spine Area Exam  Skin & Axial Inspection:Thoraco-lumbar Scoliosis Alignment:Scoliosis detected Functional HTD:[id number] ROM Stability:No instability detected Muscle Tone/Strength:Functionally intact. No obvious neuro-muscular anomalies detected. Sensory (Neurological):Musculoskeletal pain pattern Palpation:Complains of area being tender to palpation  Gait & Posture Assessment  Ambulation:Patient came in today in a wheel chair Gait:Significantly limited. Dependent on assistive device to ambulate Posture:Difficulty standing up straight, due to pain  Lower Extremity Exam    Side:Right lower extremity  Side:Left lower extremity  Stability:No instability observed  Stability:No instability observed  Skin & Extremity Inspection:Skin color, temperature, and hair growth are WNL. No peripheral edema or cyanosis. No masses, redness, swelling, asymmetry, or associated skin lesions. No contractures.  Skin & Extremity Inspection:Skin color, temperature, and hair growth are WNL. No  peripheral edema or cyanosis. No masses, redness, swelling, asymmetry, or associated skin lesions. No contractures.  Functional BWI:OMBT restricted ROMfor hip and knee joints   Functional DHR:CBUL restricted ROMfor hip and knee joints   Muscle Tone/Strength:Functionally intact. No obvious neuro-muscular anomalies detected.  Muscle Tone/Strength:Functionally intact. No obvious neuro-muscular anomalies detected.  Sensory (Neurological):Arthropathic arthralgia  Sensory (Neurological):Arthropathic arthralgia    Assessment   Status Diagnosis  Controlled Controlled Controlled 1. Closed fracture of transverse process of thoracic vertebra, sequela   2. Lumbar degenerative disc disease   3. Primary osteoarthritis of left knee   4. Encounter for long-term opiate analgesic use   5. DDD (degenerative disc disease), cervical   6. Primary osteoarthritis of both shoulders   7. Chronic pain syndrome        Plan of Care   Ms. Brinley Rosete has a current medication list which includes the following long-term medication(s): caltrate 600+d plus minerals, losartan, metoprolol tartrate, pantoprazole, potassium chloride, sodium chloride, fluticasone, [START ON 08/03/2020] hydrocodone-acetaminophen, [START ON 09/02/2020] hydrocodone-acetaminophen, and [START ON 10/02/2020] hydrocodone-acetaminophen.  Pharmacotherapy (Medications  Ordered): Meds ordered this encounter  Medications  . HYDROcodone-acetaminophen (NORCO/VICODIN) 5-325 MG tablet    Sig: Take 1 tablet by mouth daily as needed for moderate pain. For chronic pain syndrome    Dispense:  30 tablet    Refill:  0  . HYDROcodone-acetaminophen (NORCO/VICODIN) 5-325 MG tablet    Sig: Take 1 tablet by mouth daily as needed for moderate pain. For chronic pain syndrome    Dispense:  30 tablet    Refill:  0  . HYDROcodone-acetaminophen (NORCO/VICODIN) 5-325 MG tablet    Sig: Take 1 tablet by mouth daily as needed for  moderate pain. For chronic pain syndrome    Dispense:  30 tablet    Refill:  0    Follow-up plan:   Return in about 3 months (around 11/01/2020) for Medication Management, in person.   Recent Visits Date Type Provider Dept  05/04/20 Telemedicine Gillis Santa, MD [facility]  Showing recent visits within past 90 days and meeting all other requirements Today's Visits Date Type Provider Dept  08/02/20 Office Visit Gillis Santa, MD [facility]  Showing today's visits and meeting all other requirements Future Appointments Date Type Provider Dept  10/27/20 Appointment Gillis Santa, MD [facility]  Showing future appointments within next 90 days and meeting all other requirements  I discussed the assessment and treatment plan with the patient. The patient was provided an opportunity to ask questions and all were answered. The patient agreed with the plan and demonstrated an understanding of the instructions.  Patient advised to call back or seek an in-person evaluation if the symptoms or condition worsens.  Duration of encounter:36mnutes.  Note by: BGillis Santa MD Date: 08/02/2020; Time: 1:22 PM

## 2020-10-26 ENCOUNTER — Encounter: Payer: Self-pay | Admitting: Student in an Organized Health Care Education/Training Program

## 2020-10-27 ENCOUNTER — Encounter: Payer: Medicare Other | Admitting: Student in an Organized Health Care Education/Training Program

## 2020-10-27 ENCOUNTER — Encounter: Payer: Self-pay | Admitting: Student in an Organized Health Care Education/Training Program

## 2020-10-27 ENCOUNTER — Ambulatory Visit
Payer: Medicare Other | Attending: Student in an Organized Health Care Education/Training Program | Admitting: Student in an Organized Health Care Education/Training Program

## 2020-10-27 ENCOUNTER — Other Ambulatory Visit: Payer: Self-pay

## 2020-10-27 DIAGNOSIS — M19012 Primary osteoarthritis, left shoulder: Secondary | ICD-10-CM

## 2020-10-27 DIAGNOSIS — M19011 Primary osteoarthritis, right shoulder: Secondary | ICD-10-CM

## 2020-10-27 DIAGNOSIS — Z79891 Long term (current) use of opiate analgesic: Secondary | ICD-10-CM

## 2020-10-27 DIAGNOSIS — G894 Chronic pain syndrome: Secondary | ICD-10-CM

## 2020-10-27 DIAGNOSIS — M5136 Other intervertebral disc degeneration, lumbar region: Secondary | ICD-10-CM

## 2020-10-27 DIAGNOSIS — M51369 Other intervertebral disc degeneration, lumbar region without mention of lumbar back pain or lower extremity pain: Secondary | ICD-10-CM

## 2020-10-27 DIAGNOSIS — M1712 Unilateral primary osteoarthritis, left knee: Secondary | ICD-10-CM | POA: Diagnosis not present

## 2020-10-27 MED ORDER — HYDROCODONE-ACETAMINOPHEN 5-325 MG PO TABS: 1.0000 | ORAL_TABLET | Freq: Every day | ORAL | 0 refills | Status: DC | PRN

## 2020-10-27 NOTE — Progress Notes (Signed)
Patient: Susan Blackwell  Service Category: E/M  Provider: Gillis Santa, MD  DOB: 1919/06/15  DOS: 10/27/2020  Location: Office  MRN: [id number]  Setting: Ambulatory outpatient  Referring Provider: Kirk Ruths, MD  Type: Established Patient  Specialty: Interventional Pain Management  PCP: Susan Ruths, MD  Location: Home  Delivery: TeleHealth     Virtual Encounter - Pain Management PROVIDER NOTE: Information contained herein reflects review and annotations entered in association with encounter. Interpretation of such information and data should be left to medically-trained personnel. Information provided to patient can be located elsewhere in the medical record under "Patient Instructions". Document created using STT-dictation technology, any transcriptional errors that may result from process are unintentional.    Contact & Pharmacy Preferred: [place]: [phone] (home) Mobile: [phone] (mobile) E-mail: No e-mail address on record  [facility] [place] [address] [place] [address] Phone: [phone] Fax: [phone]   Pre-screening  Susan Blackwell offered "in-person" vs "virtual" encounter. She indicated preferring virtual for this encounter.   Reason COVID-19*  Social distancing based on CDC and AMA recommendations.   I contacted Susan Blackwell on 10/27/2020 via video conference.      I clearly identified myself as Susan Santa, MD. I verified that I was speaking with the correct person using two identifiers (Name: Susan Blackwell, and date of birth: 1920-03-12).  Consent I sought verbal advanced consent from Susan Blackwell for virtual visit interactions. I informed Susan Blackwell of possible security and privacy concerns, risks, and limitations associated with providing "not-in-person" medical evaluation and management services. I also informed Susan Blackwell of the availability of "in-person" appointments. Finally, I  informed her that there would be a charge for the virtual visit and that she could be  personally, fully or partially, financially responsible for it. Susan Blackwell expressed understanding and agreed to proceed.   Historic Elements   Susan Blackwell is a 87 y.o. year old, female patient evaluated today after our last contact on 08/02/2020. Susan Blackwell  has a past medical history of Bile duct stone (02/2016), Hypertension, and PONV (postoperative nausea and vomiting). She also  has a past surgical history that includes Cholecystectomy; ERCP (N/A, 03/11/2016); Breast biopsy; ERCP (N/A, 06/18/2016); Gastrointestinal stent removal (N/A, 06/18/2016); Spyglass cholangioscopy (N/A, 06/18/2016); and spyglass lithotripsy (N/A, 06/18/2016). Susan Blackwell has a current medication list which includes the following prescription(s): acetaminophen, caltrate 600+d plus minerals, cholecalciferol, fluticasone, gentamicin cream, arnicare, hydrochlorothiazide, losartan, meclizine, metoprolol tartrate, pantoprazole, potassium chloride, sodium chloride, tetrahydrozoline hcl, triamcinolone ointment, vitamin b-12, [START ON 11/02/2020] hydrocodone-acetaminophen, [START ON 12/02/2020] hydrocodone-acetaminophen, and [START ON 01/01/2021] hydrocodone-acetaminophen, and the following Facility-Administered Medications: cefoTEtan (CEFOTAN) 1 g in dextrose 5 % 50 mL IVPB. She  reports that she has never smoked. She has never used smokeless tobacco. She reports that she does not drink alcohol and does not use drugs. Susan Blackwell is allergic to tramadol and nsaids.   HPI  Today, she is being contacted for medication management.  Susan Blackwell is a pleasant 87 year old female who is being seen virtually for pain management.  She states that she is not feeling well and also had trouble getting a ride to the pain clinic today so we are doing this virtually.  Patient takes hydrocodone 5 mg 1 tablet daily for pain.  No change in dose.  No issues with  constipation or nausea.  We will refill as below.  UDS up-to-date and appropriate.  Pharmacotherapy Assessment  Analgesic: Hydrocodone 5 mg daily  as needed, quantity 30/month; MME 5    Monitoring: [place] PMP: PDMP reviewed during this encounter.       Pharmacotherapy: No side-effects or adverse reactions reported. Compliance: No problems identified. Effectiveness: Clinically acceptable. Plan: Refer to "POC".  UDS:  Summary  Date Value Ref Range Status  02/02/2020 Note  Final    Comment:    ==================================================================== [employer] [facility] 13 (MW) ==================================================================== Test                             Result       Flag       Units  Drug Present and Declared for Prescription Verification   Hydrocodone                    672          EXPECTED   ng/mg creat   Hydromorphone                  231          EXPECTED   ng/mg creat   Dihydrocodeine                 122          EXPECTED   ng/mg creat   Norhydrocodone                 922          EXPECTED   ng/mg creat    Sources of hydrocodone include scheduled prescription medications.    Hydromorphone, dihydrocodeine and norhydrocodone are expected    metabolites of hydrocodone. Hydromorphone and dihydrocodeine are    also available as scheduled prescription medications.  ==================================================================== Test                      Result    Flag   Units      Ref Range   Creatinine              64               mg/dL      >=20 ==================================================================== Declared Medications:  The flagging and interpretation on this report are based on the  following declared medications.  Unexpected results may arise from  inaccuracies in the declared medications.   **Note: The testing scope of this panel includes these medications:   Hydrocodone (Norco)   **Note: The testing scope of this panel  does not include the  following reported medications:   Acetaminophen (Tylenol)  Acetaminophen (Norco)  Aspirin  Calcium  Cyanocobalamin  Eye Drops  Hydrochlorothiazide (Hydrodiuril)  Losartan (Cozaar)  Meclizine (Antivert)  Menthol  Metoprolol (Lopressor)  Pantoprazole (Protonix)  Potassium (Klor-Con)  Sodium Chloride  Topical  Vitamin D ==================================================================== For clinical consultation, please call [phone]. ====================================================================     Laboratory Chemistry Profile   Renal Lab Results  Component Value Date   BUN 13 09/02/2019   CREATININE 0.96 09/02/2019   GFRAA 57 (L) 09/02/2019   GFRNONAA 50 (L) 09/02/2019    Hepatic Lab Results  Component Value Date   AST 22 09/02/2019   ALT 19 09/02/2019   ALBUMIN 3.8 09/02/2019   ALKPHOS 56 09/02/2019   LIPASE 30 10/06/2016    Electrolytes Lab Results  Component Value Date   NA 136 09/02/2019   K 3.6 09/02/2019   CL 104 09/02/2019   CALCIUM 9.1 09/02/2019  Bone No results found for: VD25OH, [id number], [id number], [id number], 25OHVITD1, 25OHVITD2, 25OHVITD3, TESTOFREE, TESTOSTERONE  Inflammation (CRP: Acute Phase) (ESR: Chronic Phase) Lab Results  Component Value Date   LATICACIDVEN 1.4 09/02/2019          Note: Above Lab results reviewed.  Imaging  CT Thoracic Spine Wo Contrast CLINICAL DATA:  Fall 2 days ago with back pain bilateral shoulder pain since a fall.  EXAM: CT THORACIC SPINE WITHOUT CONTRAST  TECHNIQUE: Multidetector CT images of the thoracic were obtained using the standard protocol without intravenous contrast.  COMPARISON:  None.  FINDINGS: Alignment: No traumatic malalignment. Mild degenerative thoracic dextrocurvature.  Vertebrae: Negative for vertebral fracture. The posterior left eighth rib is fractured on a chronic basis when compared with 09/30/2017 chest CT.  Paraspinal and other  soft tissues: No acute finding in the lungs when allowing for motion artifact. There is trace pleural fluid or pleural thickening in the posterior left chest. Atherosclerosis.  Disc levels: Diffuse degenerative disc narrowing and endplate spurring  IMPRESSION: No acute finding.  Negative for thoracic spine fracture.  Electronically Signed   By: Monte Fantasia M.D.   On: 08/23/2018 12:23 DG Thoracic Spine 2 View CLINICAL DATA:  [place] 2 days ago.  Severe upper back pain.  EXAM: THORACIC SPINE 2 VIEWS  COMPARISON:  Chest CT 09/30/2017  FINDINGS: Mild S-shaped scoliosis in the spine. Dextroscoliosis of the thoracic spine with levoscoliosis of the upper lumbar spine. Vertebral body heights are maintained. No evidence for a compression fracture. Atherosclerotic calcifications in the aorta.  IMPRESSION: 1. No acute abnormality in the thoracic spine. 2. Scoliosis involving the thoracic and lumbar spine.  Electronically Signed   By: Markus Daft M.D.   On: 08/23/2018 11:36 DG Shoulder Right CLINICAL DATA:  Status post fall, bilateral shoulder pain  EXAM: RIGHT SHOULDER - 2+ VIEW  COMPARISON:  Chest x-ray 03/14/2016  FINDINGS: Generalized osteopenia. No acute fracture or dislocation. Mild osteoarthritis of the glenohumeral joint. Mild arthropathy of the acromioclavicular joint. No aggressive osseous lesion.  IMPRESSION: No acute osseous injury of the right shoulder.  Electronically Signed   By: Kathreen Devoid   On: 08/23/2018 11:33 DG Shoulder Left CLINICAL DATA:  Status post fall, bilateral shoulder pain  EXAM: LEFT SHOULDER - 2+ VIEW  COMPARISON:  Chest x-ray 03/14/2016  FINDINGS: Generalized osteopenia. No acute fracture or dislocation. No aggressive osseous lesion. Mild osteoarthritis of the glenohumeral joint. Mild arthropathy of the acromioclavicular joint. Chondrocalcinosis of glenohumeral joint as can be seen with CPPD.  IMPRESSION: 1.  No acute osseous  injury of the left shoulder. 2. Mild osteoarthritis of the left glenohumeral joint.  Electronically Signed   By: Kathreen Devoid   On: 08/23/2018 11:32 DG Chest 1 View CLINICAL DATA:  Status post fall, back pain, shoulder pain  EXAM: CHEST  1 VIEW  COMPARISON:  CT chest 09/30/2017  FINDINGS: There is bilateral chronic interstitial lung disease. There is no focal consolidation. There is no pleural effusion or pneumothorax. There is mild stable cardiomegaly. There is thoracic aortic atherosclerosis. There is an S-shaped curvature of the thoracolumbar spine.  There is no acute osseous abnormality.  IMPRESSION: No active disease.  Electronically Signed   By: Kathreen Devoid   On: 08/23/2018 11:30 CT Head Wo Contrast CLINICAL DATA:  Fall 2 days prior. Base of neck, bilateral shoulder and upper back pain.  EXAM: CT HEAD WITHOUT CONTRAST  CT CERVICAL SPINE WITHOUT CONTRAST  TECHNIQUE: Multidetector CT imaging of the head and  cervical spine was performed following the standard protocol without intravenous contrast. Multiplanar CT image reconstructions of the cervical spine were also generated.  COMPARISON:  10/06/2016 brain MRI.  FINDINGS: CT HEAD FINDINGS  Brain: No evidence of parenchymal hemorrhage or extra-axial fluid collection. No mass lesion, mass effect, or midline shift. No CT evidence of acute infarction. Generalized cerebral volume loss. Nonspecific mild subcortical and periventricular white matter hypodensity, most in keeping with chronic small vessel ischemic change. No ventriculomegaly.  Vascular: No acute abnormality.  Skull: No evidence of calvarial fracture.  Sinuses/Orbits: The visualized paranasal sinuses are essentially clear.  Other:  The mastoid air cells are unopacified.  CT CERVICAL SPINE FINDINGS  Alignment: Straightening of the cervical spine. No facet subluxation. Dens is well positioned between the lateral masses of C1. Minimal 2 mm  anterolisthesis at C3-4 and C5-6.  Skull base and vertebrae: Nondisplaced fracture of the tip of transverse right T1 process. No cervical spine fracture. No primary bone lesion or focal pathologic process.  Soft tissues and spinal canal: No prevertebral edema. No visible canal hematoma.  Disc levels: Marked multilevel degenerative disc disease throughout the cervical spine, most prominent at C6-7. Advanced bilateral facet arthropathy. Mild degenerative foraminal stenosis on the right at C4-5.  Upper chest: No acute abnormality.  Other: Visualized mastoid air cells appear clear. No discrete thyroid nodules. No pathologically enlarged cervical nodes.  IMPRESSION: 1. No evidence of acute intracranial abnormality. No evidence of calvarial fracture. 2. Generalized cerebral volume loss and mild chronic small vessel ischemic changes in the cerebral white matter. 3. Nondisplaced fracture of the tip of the right T1 transverse process. No fracture within the cervical spine. No facet subluxation. 4. Marked multilevel degenerative changes in the cervical spine as detailed.  Electronically Signed   By: Ilona Sorrel M.D.   On: 08/23/2018 11:06 CT Cervical Spine Wo Contrast CLINICAL DATA:  Fall 2 days prior. Base of neck, bilateral shoulder and upper back pain.  EXAM: CT HEAD WITHOUT CONTRAST  CT CERVICAL SPINE WITHOUT CONTRAST  TECHNIQUE: Multidetector CT imaging of the head and cervical spine was performed following the standard protocol without intravenous contrast. Multiplanar CT image reconstructions of the cervical spine were also generated.  COMPARISON:  10/06/2016 brain MRI.  FINDINGS: CT HEAD FINDINGS  Brain: No evidence of parenchymal hemorrhage or extra-axial fluid collection. No mass lesion, mass effect, or midline shift. No CT evidence of acute infarction. Generalized cerebral volume loss. Nonspecific mild subcortical and periventricular white  matter hypodensity, most in keeping with chronic small vessel ischemic change. No ventriculomegaly.  Vascular: No acute abnormality.  Skull: No evidence of calvarial fracture.  Sinuses/Orbits: The visualized paranasal sinuses are essentially clear.  Other:  The mastoid air cells are unopacified.  CT CERVICAL SPINE FINDINGS  Alignment: Straightening of the cervical spine. No facet subluxation. Dens is well positioned between the lateral masses of C1. Minimal 2 mm anterolisthesis at C3-4 and C5-6.  Skull base and vertebrae: Nondisplaced fracture of the tip of transverse right T1 process. No cervical spine fracture. No primary bone lesion or focal pathologic process.  Soft tissues and spinal canal: No prevertebral edema. No visible canal hematoma.  Disc levels: Marked multilevel degenerative disc disease throughout the cervical spine, most prominent at C6-7. Advanced bilateral facet arthropathy. Mild degenerative foraminal stenosis on the right at C4-5.  Upper chest: No acute abnormality.  Other: Visualized mastoid air cells appear clear. No discrete thyroid nodules. No pathologically enlarged cervical nodes.  IMPRESSION: 1. No evidence of  acute intracranial abnormality. No evidence of calvarial fracture. 2. Generalized cerebral volume loss and mild chronic small vessel ischemic changes in the cerebral white matter. 3. Nondisplaced fracture of the tip of the right T1 transverse process. No fracture within the cervical spine. No facet subluxation. 4. Marked multilevel degenerative changes in the cervical spine as detailed.  Electronically Signed   By: Ilona Sorrel M.D.   On: 08/23/2018 11:06  Assessment  The primary encounter diagnosis was Lumbar degenerative disc disease. Diagnoses of Primary osteoarthritis of left knee, Primary osteoarthritis of both shoulders, Encounter for long-term opiate analgesic use, and Chronic pain syndrome were also pertinent to this  visit.  Plan of Care    Ms. Stormi Vandevelde has a current medication list which includes the following long-term medication(s): caltrate 600+d plus minerals, fluticasone, losartan, metoprolol tartrate, pantoprazole, potassium chloride, sodium chloride, [START ON 11/02/2020] hydrocodone-acetaminophen, [START ON 12/02/2020] hydrocodone-acetaminophen, and [START ON 01/01/2021] hydrocodone-acetaminophen.  Pharmacotherapy (Medications Ordered): Meds ordered this encounter  Medications   HYDROcodone-acetaminophen (NORCO/VICODIN) 5-325 MG tablet    Sig: Take 1 tablet by mouth daily as needed for moderate pain. For chronic pain syndrome    Dispense:  30 tablet    Refill:  0   HYDROcodone-acetaminophen (NORCO/VICODIN) 5-325 MG tablet    Sig: Take 1 tablet by mouth daily as needed for moderate pain. For chronic pain syndrome    Dispense:  30 tablet    Refill:  0   HYDROcodone-acetaminophen (NORCO/VICODIN) 5-325 MG tablet    Sig: Take 1 tablet by mouth daily as needed for moderate pain. For chronic pain syndrome    Dispense:  30 tablet    Refill:  0   Follow-up plan:   Return in about 3 months (around 01/27/2021) for Medication Management, in person.    Recent Visits Date Type Provider Dept  08/02/20 Office Visit Susan Santa, MD [facility]  Showing recent visits within past 90 days and meeting all other requirements Today's Visits Date Type Provider Dept  10/27/20 Telemedicine Susan Santa, MD [facility]  Showing today's visits and meeting all other requirements Future Appointments No visits were found meeting these conditions. Showing future appointments within next 90 days and meeting all other requirements I discussed the assessment and treatment plan with the patient. The patient was provided an opportunity to ask questions and all were answered. The patient agreed with the plan and demonstrated an understanding of the instructions.  Patient advised to call back or  seek an in-person evaluation if the symptoms or condition worsens.  Duration of encounter: 30mnutes.  Note by: BGillis Santa MD Date: 10/27/2020; Time: 1:04 PM

## 2020-11-07 ENCOUNTER — Emergency Department
Admission: EM | Admit: 2020-11-07 | Discharge: 2020-11-07 | Disposition: A | Discharge status: Home / Self Care | Payer: Medicare Other | Attending: Emergency Medicine | Admitting: Emergency Medicine

## 2020-11-07 ENCOUNTER — Emergency Department: Payer: Medicare Other

## 2020-11-07 ENCOUNTER — Other Ambulatory Visit: Payer: Self-pay

## 2020-11-07 DIAGNOSIS — R110 Nausea: Secondary | ICD-10-CM | POA: Insufficient documentation

## 2020-11-07 DIAGNOSIS — R42 Dizziness and giddiness: Secondary | ICD-10-CM | POA: Diagnosis not present

## 2020-11-07 DIAGNOSIS — Z20822 Contact with and (suspected) exposure to covid-19: Secondary | ICD-10-CM | POA: Diagnosis not present

## 2020-11-07 DIAGNOSIS — I10 Essential (primary) hypertension: Secondary | ICD-10-CM | POA: Diagnosis not present

## 2020-11-07 DIAGNOSIS — Z79899 Other long term (current) drug therapy: Secondary | ICD-10-CM | POA: Insufficient documentation

## 2020-11-07 LAB — COMPREHENSIVE METABOLIC PANEL
ALT: 15 U/L (ref 0–44)
AST: 22 U/L (ref 15–41)
Albumin: 4.1 g/dL (ref 3.5–5.0)
Alkaline Phosphatase: 53 U/L (ref 38–126)
Anion gap: 8 (ref 5–15)
BUN: 14 mg/dL (ref 8–23)
CO2: 25 mmol/L (ref 22–32)
Calcium: 9.3 mg/dL (ref 8.9–10.3)
Chloride: 104 mmol/L (ref 98–111)
Creatinine, Ser: 0.76 mg/dL (ref 0.44–1.00)
GFR, Estimated: >60 mL/min (ref >60)
Glucose, Bld: 110 mg/dL — ABNORMAL HIGH (ref 70–99)
Potassium: 3.8 mmol/L (ref 3.5–5.1)
Sodium: 137 mmol/L (ref 135–145)
Total Bilirubin: 1 mg/dL (ref 0.3–1.2)
Total Protein: 6 g/dL — ABNORMAL LOW (ref 6.5–8.1)

## 2020-11-07 LAB — CBC WITH DIFFERENTIAL/PLATELET
Abs Immature Granulocytes: 0.1 10*3/uL — ABNORMAL HIGH (ref 0.00–0.07)
Basophils Absolute: 0 10*3/uL (ref 0.0–0.1)
Basophils Relative: 0 %
Eosinophils Absolute: 0 10*3/uL (ref 0.0–0.5)
Eosinophils Relative: 0 %
HCT: 29 % — ABNORMAL LOW (ref 36.0–46.0)
Hemoglobin: 9.9 g/dL — ABNORMAL LOW (ref 12.0–15.0)
Immature Granulocytes: 2 %
Lymphocytes Relative: 14 %
Lymphs Abs: 0.7 10*3/uL (ref 0.7–4.0)
MCH: 36.8 pg — ABNORMAL HIGH (ref 26.0–34.0)
MCHC: 34.1 g/dL (ref 30.0–36.0)
MCV: 107.8 fL — ABNORMAL HIGH (ref 80.0–100.0)
Monocytes Absolute: 0.4 10*3/uL (ref 0.1–1.0)
Monocytes Relative: 8 %
Neutro Abs: 3.7 10*3/uL (ref 1.7–7.7)
Neutrophils Relative %: 76 %
Platelets: 147 10*3/uL — ABNORMAL LOW (ref 150–400)
RBC: 2.69 MIL/uL — ABNORMAL LOW (ref 3.87–5.11)
RDW: 15.5 % (ref 11.5–15.5)
WBC: 4.9 10*3/uL (ref 4.0–10.5)
nRBC: 0 % (ref 0.0–0.2)

## 2020-11-07 LAB — URINALYSIS, COMPLETE (UACMP) WITH MICROSCOPIC
Bacteria, UA: NONE SEEN
Bilirubin Urine: NEGATIVE
Glucose, UA: NEGATIVE mg/dL
Ketones, ur: NEGATIVE mg/dL
Nitrite: NEGATIVE
Protein, ur: NEGATIVE mg/dL
Specific Gravity, Urine: 1.008 (ref 1.005–1.030)
Squamous Epithelial / HPF: NONE SEEN (ref 0–5)
pH: 6 (ref 5.0–8.0)

## 2020-11-07 LAB — TSH: TSH: 1.605 u[IU]/mL (ref 0.350–4.500)

## 2020-11-07 LAB — RESP PANEL BY RT-PCR (FLU A&B, COVID) ARPGX2
Influenza A by PCR: NEGATIVE
Influenza B by PCR: NEGATIVE
SARS Coronavirus 2 by RT PCR: NEGATIVE

## 2020-11-07 LAB — LIPASE, BLOOD: Lipase: 32 U/L (ref 11–51)

## 2020-11-07 LAB — TROPONIN I (HIGH SENSITIVITY)
Troponin I (High Sensitivity): 8 ng/L (ref <18)
Troponin I (High Sensitivity): 11 ng/L (ref <18)

## 2020-11-07 LAB — T4, FREE: Free T4: 0.83 ng/dL (ref 0.61–1.12)

## 2020-11-07 NOTE — Discharge Instructions (Addendum)
Your work-up so far has been reassuring, I have low suspicion for stroke but if your dizziness comes back on and is not going away after meclizine you should return to the ER immediately for repeat evaluation and consideration of MRI like we discussed.  Return to the ER if you develop any other new symptoms or any other concerns.

## 2020-11-07 NOTE — ED Notes (Signed)
Pt oob to toilet with minimal assist. Urine sample obtained

## 2020-11-07 NOTE — ED Triage Notes (Signed)
pt came from home c/o dizziness and abd pain since friday; pt said pain increased overnight; pt did not take HTN med this am. Pt took a meclozine yesterday and had some relief but when she laid back down last night she felt bas again.

## 2020-11-07 NOTE — ED Notes (Signed)
Patient family called and notified for discharge

## 2020-11-07 NOTE — ED Provider Notes (Signed)
[name] Emergency Department Provider Note  ____________________________________________   Event Date/Time   First MD Initiated Contact with Patient 11/07/20 0912     (approximate)  I have reviewed the triage vital signs and the nursing notes.   HISTORY  Chief Complaint Dizziness    HPI Susan Blackwell is a 87 y.o. female with HTN who comes in with dizziness.  Contrary to triage note patient is adamant that she denies any abdominal pain.  Patient initially starts off stating that she has felt like she is had parasites for 2 years.  She states that she is gone to her primary care doctor and they have told her that there are no parasites but she is still insistent that she has them.  Did review patient's records and she had 2 negative parasites testing in 2021.  I asked patient why she was here today and she stated that she did not feel well on Friday and felt nauseous and felt a little dizzy.  She reports taking meclizine due to a history of vertigo.  States that symptoms have been kind of coming and going and she last had them last night.  Denies any vomiting or abdominal pain.  She states that she felt nauseous.  She states that her symptoms are now all gone.  Unclear what brought it on.  Denies any abdominal pain, coughing, shortness of breath or any other concerns right now.  States that she occasionally will get some ear pain and has a scab on the right inner ear.  Patient reports that she lives at her home by herself.  She is able to ambulate without a walker.  She denies any concerns taking care of herself.          Past Medical History:  Diagnosis Date   Bile duct stone 02/2016   Hypertension    PONV (postoperative nausea and vomiting)     Patient Active Problem List   Diagnosis Date Noted   Chronic pain syndrome 09/25/2018   Closed fracture of transverse process of thoracic vertebra (HCC) 09/25/2018   DDD (degenerative disc disease), cervical  09/25/2018   Lumbar degenerative disc disease 09/25/2018   Primary osteoarthritis of both shoulders 09/25/2018   Encounter for long-term opiate analgesic use 09/25/2018   Anemia 03/25/2016   Fever    Bile duct stone 03/11/2016   HTN (hypertension) 03/10/2016   Common bile duct calculus 03/10/2016   Common bile duct dilatation 03/10/2016    Past Surgical History:  Procedure Laterality Date   BREAST BIOPSY     CHOLECYSTECTOMY     ERCP N/A 03/11/2016   Procedure: ENDOSCOPIC RETROGRADE CHOLANGIOPANCREATOGRAPHY (ERCP);  Surgeon: Vida Rigger, MD;  Location[facility] ENDOSCOPY;  Service: Endoscopy;  Laterality: N/A;   ERCP N/A 06/18/2016   Procedure: ENDOSCOPIC RETROGRADE CHOLANGIOPANCREATOGRAPHY (ERCP);  Surgeon: Vida Rigger, MD;  Location: [facility] ENDOSCOPY;  Service: Endoscopy;  Laterality: N/A;  moved for litho us/LH   GASTROINTESTINAL STENT REMOVAL N/A 06/18/2016   Procedure: GASTROINTESTINAL STENT REMOVAL;  Surgeon: Vida Rigger, MD;  Location: [facility] ENDOSCOPY;  Service: Endoscopy;  Laterality: N/A;   SPYGLASS CHOLANGIOSCOPY N/A 06/18/2016   Procedure: ZOXWRUEA CHOLANGIOSCOPY;  Surgeon: Vida Rigger, MD;  Location: [facility] ENDOSCOPY;  Service: Endoscopy;  Laterality: N/A;   SPYGLASS LITHOTRIPSY N/A 06/18/2016   Procedure: VWUJWJXB LITHOTRIPSY;  Surgeon: Vida Rigger, MD;  Location: [facility] ENDOSCOPY;  Service: Endoscopy;  Laterality: N/A;    Prior to Admission medications   Medication Sig Start Date End Date Taking? Authorizing  Provider  Acetaminophen (TYLENOL ARTHRITIS PAIN PO) Take by mouth 2 (two) times daily.    [provider]  Calcium Carbonate-Vit D-Min (CALTRATE 600+D PLUS MINERALS) 600-800 MG-UNIT TABS Take 1 tablet by mouth daily.    [provider]  Cholecalciferol 2000 units TABS Take 1,000 Units by mouth daily.    [provider]  fluticasone (FLONASE) 50 MCG/ACT nasal spray Place into both nostrils. 07/27/20   [provider]  gentamicin cream (GARAMYCIN) 0.1 % Apply  topically. 07/27/20   [provider]  Homeopathic Products (ARNICARE) GEL Apply 1 application topically daily as needed (pain).    [provider]  hydrochlorothiazide (HYDRODIURIL) 25 MG tablet Take 25 mg by mouth daily. 03/28/16   [provider]  HYDROcodone-acetaminophen (NORCO/VICODIN) 5-325 MG tablet Take 1 tablet by mouth daily as needed for moderate pain. For chronic pain syndrome 11/02/20 12/02/20  Edward Jolly, MD  HYDROcodone-acetaminophen (NORCO/VICODIN) 5-325 MG tablet Take 1 tablet by mouth daily as needed for moderate pain. For chronic pain syndrome 12/02/20 01/01/21  Edward Jolly, MD  HYDROcodone-acetaminophen (NORCO/VICODIN) 5-325 MG tablet Take 1 tablet by mouth daily as needed for moderate pain. For chronic pain syndrome 01/01/21 01/31/21  Edward Jolly, MD  losartan (COZAAR) 100 MG tablet Take 100 mg by mouth daily.    [provider]  meclizine (ANTIVERT) 25 MG tablet Take 1 tablet (25 mg total) by mouth 3 (three) times daily as needed for dizziness. 10/06/16   Loren Racer, MD  metoprolol (LOPRESSOR) 50 MG tablet Take 50 mg by mouth 2 (two) times daily.    [provider]  pantoprazole (PROTONIX) 40 MG tablet Take 40 mg by mouth daily.    [provider]  potassium chloride (K-DUR) 10 MEQ tablet Take 10 mEq by mouth daily.  01/25/16   [provider]  sodium chloride (OCEAN) 0.65 % SOLN nasal spray Place 1 spray into both nostrils as needed for congestion.    [provider]  Tetrahydrozoline HCl (VISINE OP) Apply 1 drop to eye daily as needed (dry eyes).    [provider]  triamcinolone ointment (KENALOG) 0.1 % Apply 1 application topically 2 (two) times daily. 03/24/20   Menshew, Charlesetta Ivory, PA-C  vitamin B-12 (CYANOCOBALAMIN) 1000 MCG tablet Take 1,000 mcg by mouth daily.    [provider]    Allergies Tramadol and Nsaids  History reviewed. No pertinent family history.  Social  History Social History   Tobacco Use   Smoking status: Never   Smokeless tobacco: Never  Substance Use Topics   Alcohol use: No   Drug use: No      Review of Systems Constitutional: No fever/chills, dizzy Eyes: No visual changes. ENT: No sore throat. Cardiovascular: Denies chest pain. Respiratory: Denies shortness of breath. Gastrointestinal: No abdominal pain.  Positive nausea no diarrhea.  No constipation. Genitourinary: Negative for dysuria. Musculoskeletal: Negative for back pain. Skin: Negative for rash. Neurological: Negative for headaches, focal weakness or numbness. All other ROS negative ____________________________________________   PHYSICAL EXAM:  VITAL SIGNS: ED Triage Vitals  Enc Vitals Group     BP 11/07/20 0859 (!) 197/66     Pulse Rate 11/07/20 0859 72     Resp 11/07/20 0859 18     Temp 11/07/20 0859 98.5 F (36.9 C)     Temp Source 11/07/20 0859 Oral     SpO2 11/07/20 0859 98 %     Weight 11/07/20 0900 96 lb (43.5 kg)  Height 11/07/20 0900 4\' 9"  (1.448 m)     Head Circumference --      Peak Flow --      Pain Score 11/07/20 0900 4     Pain Loc --      Pain Edu? --      Excl. in GC? --     Constitutional: Alert and oriented. Well appearing and in no acute distress. Eyes: Conjunctivae are normal. EOMI. Head: Atraumatic.  TMs are clear bilaterally.  Small scab on the inner right ear Nose: No congestion/rhinnorhea. Mouth/Throat: Mucous membranes are moist.   Neck: No stridor. Trachea Midline. FROM Cardiovascular: Normal rate, regular rhythm. Grossly normal heart sounds.  Good peripheral circulation. Respiratory: Normal respiratory effort.  No retractions. Lungs CTAB. Gastrointestinal: Soft and nontender. No distention. No abdominal bruits.  Musculoskeletal: No lower extremity tenderness nor edema.  No joint effusions. Neurologic:  Normal speech and language. No gross focal neurologic deficits are appreciated.  Cranial nerves are intact.   Equal strength in arms and legs.  Finger-to-nose intact bilaterally. Skin:  Skin is warm, dry and intact. No rash noted. Psychiatric: Mood and affect are normal. Speech and behavior are normal. GU: Deferred   ____________________________________________   LABS (all labs ordered are listed, but only abnormal results are displayed)  Labs Reviewed  CBC WITH DIFFERENTIAL/PLATELET - Abnormal; Notable for the following components:      Result Value   RBC 2.69 (*)    Hemoglobin 9.9 (*)    HCT 29.0 (*)    MCV 107.8 (*)    MCH 36.8 (*)    Platelets 147 (*)    Abs Immature Granulocytes 0.10 (*)    All other components within normal limits  COMPREHENSIVE METABOLIC PANEL - Abnormal; Notable for the following components:   Glucose, Bld 110 (*)    Total Protein 6.0 (*)    All other components within normal limits  RESP PANEL BY RT-PCR (FLU A&B, COVID) ARPGX2  LIPASE, BLOOD  TSH  T4, FREE  URINALYSIS, COMPLETE (UACMP) WITH MICROSCOPIC  TROPONIN I (HIGH SENSITIVITY)  TROPONIN I (HIGH SENSITIVITY)   ____________________________________________   ED ECG REPORT I, 11/09/20, the attending physician, personally viewed and interpreted this ECG.  Normal sinus rate 69, no ST elevation, no T wave inversions, left bundle branch block ____________________________________________  RADIOLOGY I, Concha Se, personally viewed and evaluated these images (plain radiographs) as part of my medical decision making, as well as reviewing the written report by the radiologist.  ED MD interpretation: No intracranial hemorrhage  Official radiology report(s): CT Head Wo Contrast  Result Date: 11/07/2020 CLINICAL DATA:  87 year old female with dizziness for 4 days. EXAM: CT HEAD WITHOUT CONTRAST TECHNIQUE: Contiguous axial images were obtained from the base of the skull through the vertex without intravenous contrast. COMPARISON:  Brain MRI 09/26/2016.  Head CT 08/23/2018. FINDINGS: Brain: Cerebral  volume is not significantly changed since 2020. No midline shift, ventriculomegaly, mass effect, evidence of mass lesion, intracranial hemorrhage or evidence of cortically based acute infarction. Gray-white matter differentiation is stable and within normal limits for age. No cortical encephalomalacia identified. Vascular: Calcified atherosclerosis at the skull base. No suspicious intracranial vascular hyperdensity. Skull: No acute osseous abnormality identified. Sinuses/Orbits: Visualized paranasal sinuses and mastoids are stable and well aerated. Tympanic cavities remain clear. Other: No acute orbit or scalp soft tissue finding. IMPRESSION: Stable and normal for age non contrast CT appearance of the brain. Electronically Signed   By: 2021.D.  On: 11/07/2020 11:22    ____________________________________________   PROCEDURES  Procedure(s) performed (including Critical Care):  .1-3 Lead EKG Interpretation  Date/Time: 11/07/2020 12:46 PM Performed by: Concha Se, MD Authorized by: Concha Se, MD     Interpretation: normal     ECG rate:  60s   ECG rate assessment: normal     Rhythm: sinus rhythm     Ectopy: none     Conduction: normal     ____________________________________________   INITIAL IMPRESSION / ASSESSMENT AND PLAN / ED COURSE  Marilyne Haseley was evaluated in Emergency Department on 11/07/2020 for the symptoms described in the history of present illness. She was evaluated in the context of the global COVID-19 pandemic, which necessitated consideration that the patient might be at risk for infection with the SARS-CoV-2 virus that causes COVID-19. Institutional protocols and algorithms that pertain to the evaluation of patients at risk for COVID-19 are in a state of rapid change based on information released by regulatory bodies including the CDC and federal and state organizations. These policies and algorithms were followed during the patient's care in the ED.     Patient is a well-appearing 87 year old who comes in with an episode of dizziness and nausea and just feeling off since Friday but denies any symptoms at this time.  Patient seems to be preoccupied with the possibility of having parasites.  I reviewed and patient's had been mentioning this at multiple visits at the ER and with her primary care doctor.  Parasite testing has been negative.  Do not think she meets criteria for IVC given this is been a chronic issue for patient and patient appears to be taking care of her self is able to ambulate without assistance and otherwise does not have any other confusion.    She otherwise appears neuro intact there is no evidence of stroke on examination.  Her abdomen is soft and nontender and low suspicion for acute abdominal process.  Will get labs to evaluate for Electra abnormalities, AKI, UTI and keep patient on the cardiac monitor to evaluate for arrhythmia  Attempted to call all numbers listed for the son but I did not get any answer.  Patient has been ambulatory to the room is continuing to deny additional symptoms.  This time I have low suspicion for stroke.  Labs show low hemoglobin stable from baseline.  Otherwise late reassuring labs.  Labs are reassuring.  Patient is ambulatory.  Repeat exam she remains with a nontender abdomen and continues to deny any symptoms for over 5 hours since being here.  Discussed with patient that at this time I have very low suspicion for stroke given her symptoms have since all resolved.  We discussed MRI but patient would like to hold off.  Explained to patient that if her symptoms are returning she can take her meclizine and if is not getting better that she should return to the ER for reevaluation.  Patient expressed understanding felt comfortable with this plan.  Patient states that she does well taking care of herself at home and denies any concerns with this  I discussed the provisional nature of ED diagnosis, the  treatment so far, the ongoing plan of care, follow up appointments and return precautions with the patient and any family or support people present. They expressed understanding and agreed with the plan, discharged home.        ____________________________________________   FINAL CLINICAL IMPRESSION(S) / ED DIAGNOSES   Final diagnoses:  Dizziness  MEDICATIONS GIVEN DURING THIS VISIT:  Medications - No data to display   ED Discharge Orders     None        Note:  This document was prepared using Dragon voice recognition software and may include unintentional dictation errors.    Concha Se, MD 11/07/20 [phone]

## 2020-12-14 ENCOUNTER — Ambulatory Visit: Payer: Medicare Other | Admitting: Dermatology

## 2021-01-26 ENCOUNTER — Encounter: Payer: Self-pay | Admitting: Student in an Organized Health Care Education/Training Program

## 2021-01-26 ENCOUNTER — Ambulatory Visit
Payer: Medicare Other | Attending: Student in an Organized Health Care Education/Training Program | Admitting: Student in an Organized Health Care Education/Training Program

## 2021-01-26 ENCOUNTER — Other Ambulatory Visit: Payer: Self-pay

## 2021-01-26 VITALS — BP 154/64 | HR 69 | Resp 18 | Wt 89.0 lb

## 2021-01-26 DIAGNOSIS — M1712 Unilateral primary osteoarthritis, left knee: Secondary | ICD-10-CM | POA: Diagnosis not present

## 2021-01-26 DIAGNOSIS — M5136 Other intervertebral disc degeneration, lumbar region: Secondary | ICD-10-CM | POA: Diagnosis not present

## 2021-01-26 DIAGNOSIS — M19011 Primary osteoarthritis, right shoulder: Secondary | ICD-10-CM | POA: Diagnosis not present

## 2021-01-26 DIAGNOSIS — M19012 Primary osteoarthritis, left shoulder: Secondary | ICD-10-CM | POA: Diagnosis present

## 2021-01-26 DIAGNOSIS — Z79891 Long term (current) use of opiate analgesic: Secondary | ICD-10-CM | POA: Insufficient documentation

## 2021-01-26 DIAGNOSIS — G894 Chronic pain syndrome: Secondary | ICD-10-CM | POA: Diagnosis present

## 2021-01-26 MED ORDER — HYDROCODONE-ACETAMINOPHEN 5-325 MG PO TABS: 1.0000 | ORAL_TABLET | Freq: Every day | ORAL | 0 refills | Status: DC | PRN

## 2021-01-26 NOTE — Progress Notes (Signed)
Nursing Pain Medication Assessment:  Safety precautions to be maintained throughout the outpatient stay will include: orient to surroundings, keep bed in low position, maintain call bell within reach at all times, provide assistance with transfer out of bed and ambulation.  Medication Inspection Compliance: Pill count conducted under aseptic conditions, in front of the patient. Neither the pills nor the bottle was removed from the patient's sight at any time. Once count was completed pills were immediately returned to the patient in their original bottle.  Medication: Hydrocodone/APAP Pill/Patch Count:  6 of 30 pills remain Pill/Patch Appearance: Markings consistent with prescribed medication Bottle Appearance: Standard pharmacy container. Clearly labeled. Filled Date: 09 / 12 / 2022 Last Medication intake:  Yesterday

## 2021-01-26 NOTE — Progress Notes (Signed)
PROVIDER NOTE: Information contained herein reflects review and annotations entered in association with encounter. Interpretation of such information and data should be left to medically-trained personnel. Information provided to patient can be located elsewhere in the medical record under "Patient Instructions". Document created using STT-dictation technology, any transcriptional errors that may result from process are unintentional.    Patient: Susan Blackwell  Service Category: E/M  Provider: Gillis Santa, MD  DOB: 11/04/1919  DOS: 01/26/2021  Specialty: Interventional Pain Management  MRN: [id number]  Setting: Ambulatory outpatient  PCP: Kirk Ruths, MD  Type: Established Patient    Referring Provider: Kirk Ruths, MD  Location: Office  Delivery: Face-to-face     HPI  Susan Blackwell, a 87 y.o. year old female, is here today because of her Encounter for long-term opiate analgesic use [Z79.891]. Susan Blackwell primary complain today is Arm Pain (bilateral), Hip Pain (bilateral), Leg Pain (bilateral), and Shoulder Pain (bilateral)  Pertinent problems: Susan Blackwell has Chronic pain syndrome; Closed fracture of transverse process of thoracic vertebra ([place]); DDD (degenerative disc disease), cervical; Lumbar degenerative disc disease; Primary osteoarthritis of both shoulders; and Encounter for long-term opiate analgesic use on their pertinent problem list. Pain Assessment: Severity of Chronic pain is reported as a 7 /10. Location: Hip Right, Left/legs. Onset: More than a month ago. Quality: Aching, Constant, Dull, Discomfort, Crying. Timing: Constant. Modifying factor(s): medications, heat. Vitals:  weight is 89 lb (40.4 kg). Her blood pressure is 154/64 (abnormal) and her pulse is 69. Her respiration is 18 and oxygen saturation is 99%.   Reason for encounter: medication management.    No change in medical history since last visit.  Patient's pain is at baseline.  Patient continues  multimodal pain regimen as prescribed.  States that it provides pain relief and improvement in functional status.    Pharmacotherapy Assessment  Analgesic: Hydrocodone 5 mg daily as needed, quantity 30/month; MME 5    Monitoring:  PMP: PDMP reviewed during this encounter.       Pharmacotherapy: No side-effects or adverse reactions reported. Compliance: No problems identified. Effectiveness: Clinically acceptable.  UDS:  Summary  Date Value Ref Range Status  02/02/2020 Note  Final    Comment:    ==================================================================== [employer] Select 13 (MW) ==================================================================== Test                             Result       Flag       Units  Drug Present and Declared for Prescription Verification   Hydrocodone                    672          EXPECTED   ng/mg creat   Hydromorphone                  231          EXPECTED   ng/mg creat   Dihydrocodeine                 122          EXPECTED   ng/mg creat   Norhydrocodone                 922          EXPECTED   ng/mg creat    Sources of hydrocodone include scheduled prescription medications.    Hydromorphone, dihydrocodeine and norhydrocodone are expected  metabolites of hydrocodone. Hydromorphone and dihydrocodeine are    also available as scheduled prescription medications.  ==================================================================== Test                      Result    Flag   Units      Ref Range   Creatinine              64               mg/dL      >=20 ==================================================================== Declared Medications:  The flagging and interpretation on this report are based on the  following declared medications.  Unexpected results may arise from  inaccuracies in the declared medications.   **Note: The testing scope of this panel includes these medications:   Hydrocodone (Norco)   **Note: The testing scope of  this panel does not include the  following reported medications:   Acetaminophen (Tylenol)  Acetaminophen (Norco)  Aspirin  Calcium  Cyanocobalamin  Eye Drops  Hydrochlorothiazide (Hydrodiuril)  Losartan (Cozaar)  Meclizine (Antivert)  Menthol  Metoprolol (Lopressor)  Pantoprazole (Protonix)  Potassium (Klor-Con)  Sodium Chloride  Topical  Vitamin D ==================================================================== For clinical consultation, please call [phone]. ====================================================================       ROS  Constitutional: Denies any fever or chills Gastrointestinal: No reported hemesis, hematochezia, vomiting, or acute GI distress Musculoskeletal: Denies any acute onset joint swelling, redness, loss of ROM, or weakness Neurological: No reported episodes of acute onset apraxia, aphasia, dysarthria, agnosia, amnesia, paralysis, loss of coordination, or loss of consciousness  Medication Review  Acetaminophen, Arnicare, Caltrate 600+D Plus Minerals, Cholecalciferol, HYDROcodone-acetaminophen, Tetrahydrozoline HCl, fluticasone, gentamicin cream, hydrochlorothiazide, losartan, meclizine, metoprolol tartrate, pantoprazole, potassium chloride, sodium chloride, triamcinolone ointment, and vitamin B-12  History Review  Allergy: Susan Blackwell is allergic to tramadol and nsaids. Drug: Susan Blackwell  reports no history of drug use. Alcohol:  reports no history of alcohol use. Tobacco:  reports that she has never smoked. She has never used smokeless tobacco. Social: Susan Blackwell  reports that she has never smoked. She has never used smokeless tobacco. She reports that she does not drink alcohol and does not use drugs. Medical:  has a past medical history of Bile duct stone (02/2016), Hypertension, and PONV (postoperative nausea and vomiting). Surgical: Susan Blackwell  has a past surgical history that includes Cholecystectomy; ERCP (N/A, 03/11/2016); Breast  biopsy; ERCP (N/A, 06/18/2016); Gastrointestinal stent removal (N/A, 06/18/2016); Spyglass cholangioscopy (N/A, 06/18/2016); and spyglass lithotripsy (N/A, 06/18/2016). Family: family history is not on file.  Laboratory Chemistry Profile   Renal Lab Results  Component Value Date   BUN 14 11/07/2020   CREATININE 0.76 11/07/2020   GFRAA 57 (L) 09/02/2019   GFRNONAA >60 11/07/2020     Hepatic Lab Results  Component Value Date   AST 22 11/07/2020   ALT 15 11/07/2020   ALBUMIN 4.1 11/07/2020   ALKPHOS 53 11/07/2020   LIPASE 32 11/07/2020     Electrolytes Lab Results  Component Value Date   NA 137 11/07/2020   K 3.8 11/07/2020   CL 104 11/07/2020   CALCIUM 9.3 11/07/2020     Bone No results found for: VD25OH, VD125OH2TOT, FY1017PZ0, CH8527PO2, 25OHVITD1, 25OHVITD2, 25OHVITD3, TESTOFREE, TESTOSTERONE   Inflammation (CRP: Acute Phase) (ESR: Chronic Phase) Lab Results  Component Value Date   LATICACIDVEN 1.4 09/02/2019       Note: Above Lab results reviewed.  Recent Imaging Review  CT Head Wo Contrast CLINICAL DATA:  87 year old female with  dizziness for 4 days.  EXAM: CT HEAD WITHOUT CONTRAST  TECHNIQUE: Contiguous axial images were obtained from the base of the skull through the vertex without intravenous contrast.  COMPARISON:  Brain MRI 09/26/2016.  Head CT 08/23/2018.  FINDINGS: Brain: Cerebral volume is not significantly changed since 2020. No midline shift, ventriculomegaly, mass effect, evidence of mass lesion, intracranial hemorrhage or evidence of cortically based acute infarction. Gray-white matter differentiation is stable and within normal limits for age. No cortical encephalomalacia identified.  Vascular: Calcified atherosclerosis at the skull base. No suspicious intracranial vascular hyperdensity.  Skull: No acute osseous abnormality identified.  Sinuses/Orbits: Visualized paranasal sinuses and mastoids are stable and well aerated. Tympanic  cavities remain clear.  Other: No acute orbit or scalp soft tissue finding.  IMPRESSION: Stable and normal for age non contrast CT appearance of the brain.  Electronically Signed   By: Genevie Ann M.D.   On: 11/07/2020 11:22 Note: Reviewed        Physical Exam  General appearance: Well nourished, well developed, and well hydrated. In no apparent acute distress Mental status: Alert, oriented x 3 (person, place, & time)       Respiratory: No evidence of acute respiratory distress Eyes: PERLA Vitals: BP (!) 154/64   Pulse 69   Resp 18   Wt 89 lb (40.4 kg)   SpO2 99%   BMI 19.26 kg/m  BMI: Estimated body mass index is 19.26 kg/m as calculated from the following:   Height as of 11/07/20: _0  (1.448 m).   Weight as of this encounter: 89 lb (40.4 kg). Ideal: Female patients must weigh at least 45.5 kg to calculate ideal body weight    Lumbar Spine Area Exam  Skin & Axial Inspection: Thoraco-lumbar Scoliosis Alignment: Scoliosis detected Functional ROM: Decreased ROM       Stability: No instability detected Muscle Tone/Strength: Functionally intact. No obvious neuro-muscular anomalies detected. Sensory (Neurological): Musculoskeletal pain pattern Palpation: Complains of area being tender to palpation         Gait & Posture Assessment  Ambulation: Patient came in today in a wheel chair Gait: Significantly limited. Dependent on assistive device to ambulate Posture: Difficulty standing up straight, due to pain    Lower Extremity Exam      Side: Right lower extremity   Side: Left lower extremity  Stability: No instability observed           Stability: No instability observed          Skin & Extremity Inspection: Skin color, temperature, and hair growth are WNL. No peripheral edema or cyanosis. No masses, redness, swelling, asymmetry, or associated skin lesions. No contractures.   Skin & Extremity Inspection: Skin color, temperature, and hair growth are WNL. No peripheral edema or  cyanosis. No masses, redness, swelling, asymmetry, or associated skin lesions. No contractures.  Functional ROM: Pain restricted ROM for hip and knee joints           Functional ROM: Pain restricted ROM for hip and knee joints          Muscle Tone/Strength: Functionally intact. No obvious neuro-muscular anomalies detected.   Muscle Tone/Strength: Functionally intact. No obvious neuro-muscular anomalies detected.  Sensory (Neurological): Arthropathic arthralgia         Sensory (Neurological): Arthropathic arthralgia           Assessment   Status Diagnosis  Controlled Controlled Controlled 1. Encounter for long-term opiate analgesic use   2. Lumbar degenerative disc disease  3. Primary osteoarthritis of left knee   4. Primary osteoarthritis of both shoulders   5. Chronic pain syndrome        Plan of Care   Susan Blackwell has a current medication list which includes the following long-term medication(s): caltrate 600+d plus minerals, fluticasone, losartan, metoprolol tartrate, pantoprazole, potassium chloride, sodium chloride, [START ON 01/31/2021] hydrocodone-acetaminophen, [START ON 03/02/2021] hydrocodone-acetaminophen, and [START ON 04/01/2021] hydrocodone-acetaminophen.  Pharmacotherapy (Medications Ordered): Meds ordered this encounter  Medications   HYDROcodone-acetaminophen (NORCO/VICODIN) 5-325 MG tablet    Sig: Take 1 tablet by mouth daily as needed for moderate pain. For chronic pain syndrome    Dispense:  30 tablet    Refill:  0   HYDROcodone-acetaminophen (NORCO/VICODIN) 5-325 MG tablet    Sig: Take 1 tablet by mouth daily as needed for moderate pain. For chronic pain syndrome    Dispense:  30 tablet    Refill:  0   HYDROcodone-acetaminophen (NORCO/VICODIN) 5-325 MG tablet    Sig: Take 1 tablet by mouth daily as needed for moderate pain. For chronic pain syndrome    Dispense:  30 tablet    Refill:  0    Follow-up plan:   Return in about 3 months (around  04/28/2021) for Medication Management, in person.   Recent Visits No visits were found meeting these conditions. Showing recent visits within past 90 days and meeting all other requirements Today's Visits Date Type Provider Dept  01/26/21 Office Visit Gillis Santa, MD [facility]  Showing today's visits and meeting all other requirements Future Appointments No visits were found meeting these conditions. Showing future appointments within next 90 days and meeting all other requirements I discussed the assessment and treatment plan with the patient. The patient was provided an opportunity to ask questions and all were answered. The patient agreed with the plan and demonstrated an understanding of the instructions.  Patient advised to call back or seek an in-person evaluation if the symptoms or condition worsens.  Duration of encounter:69mnutes.  Note by: BGillis Santa MD Date: 01/26/2021; Time: 11:39 AM

## 2021-01-29 LAB — TOXASSURE SELECT 13 (MW), URINE

## 2021-03-13 IMAGING — CR THORACIC SPINE 2 VIEWS
2 series · 3 of 3 positions shown · non-contrast
Comparison: Chest CT 09/30/2017

CLINICAL DATA: Fell 2 days ago.  Severe upper back pain.

EXAM:
THORACIC SPINE 2 VIEWS

[t-spine ap]
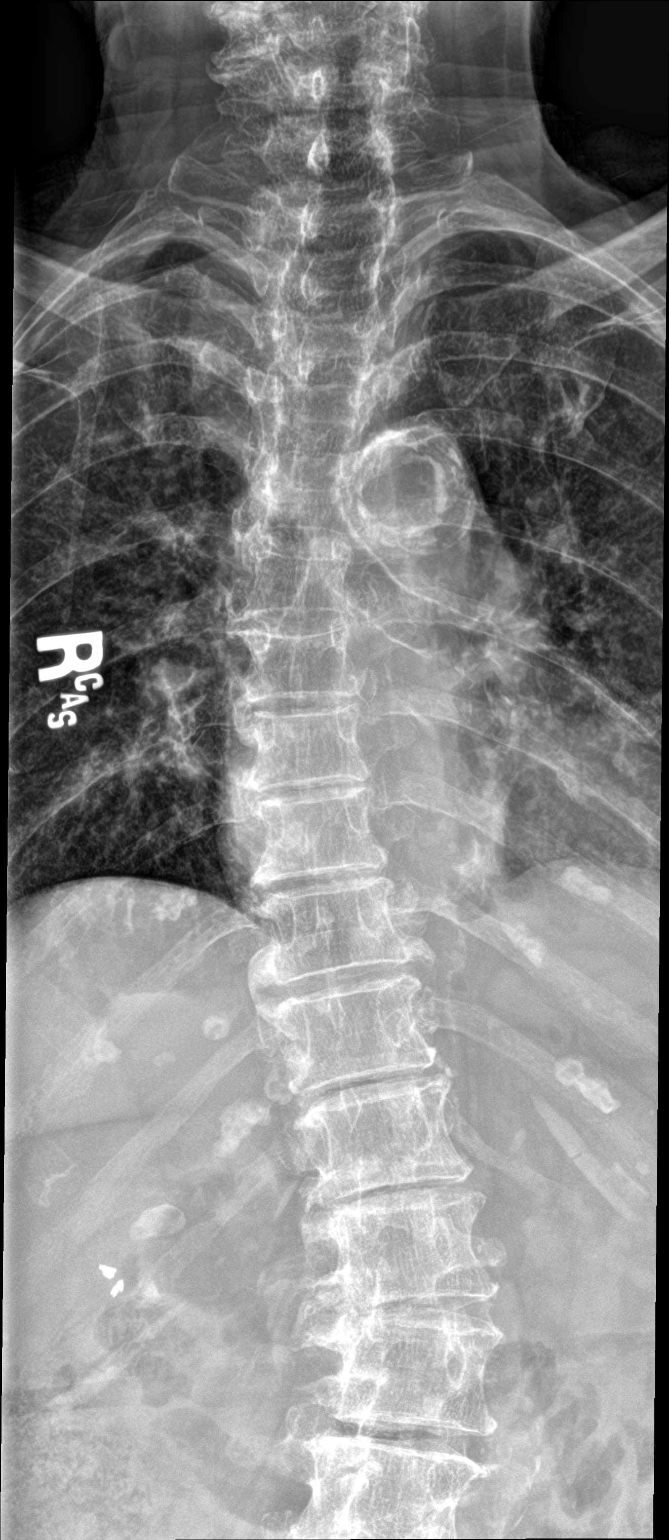

[Series 2: t-spine lat · 0.14mm/px · 2 of 2 slices shown]
[im 1/2]
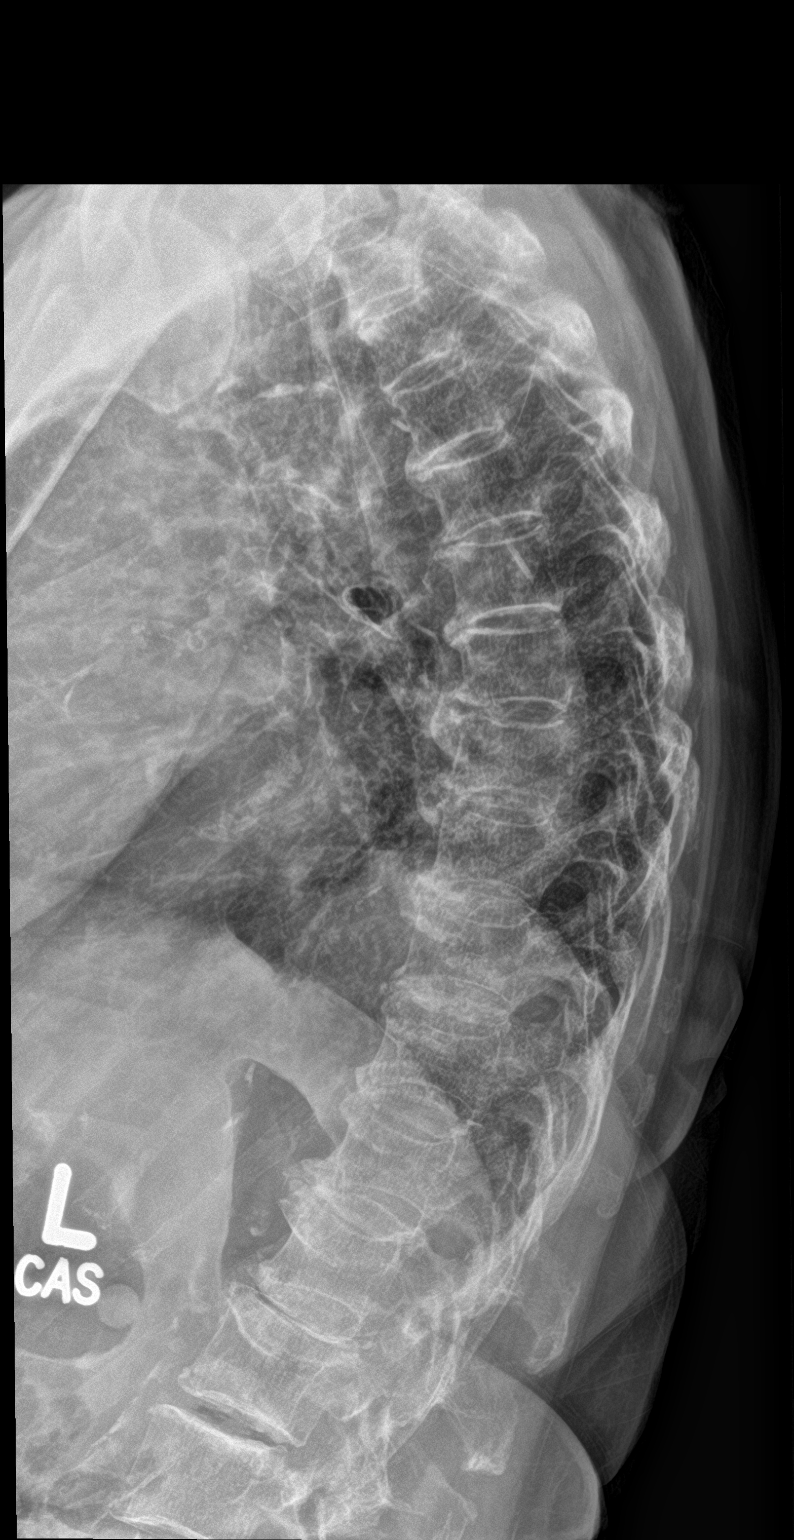
[im 2/2]
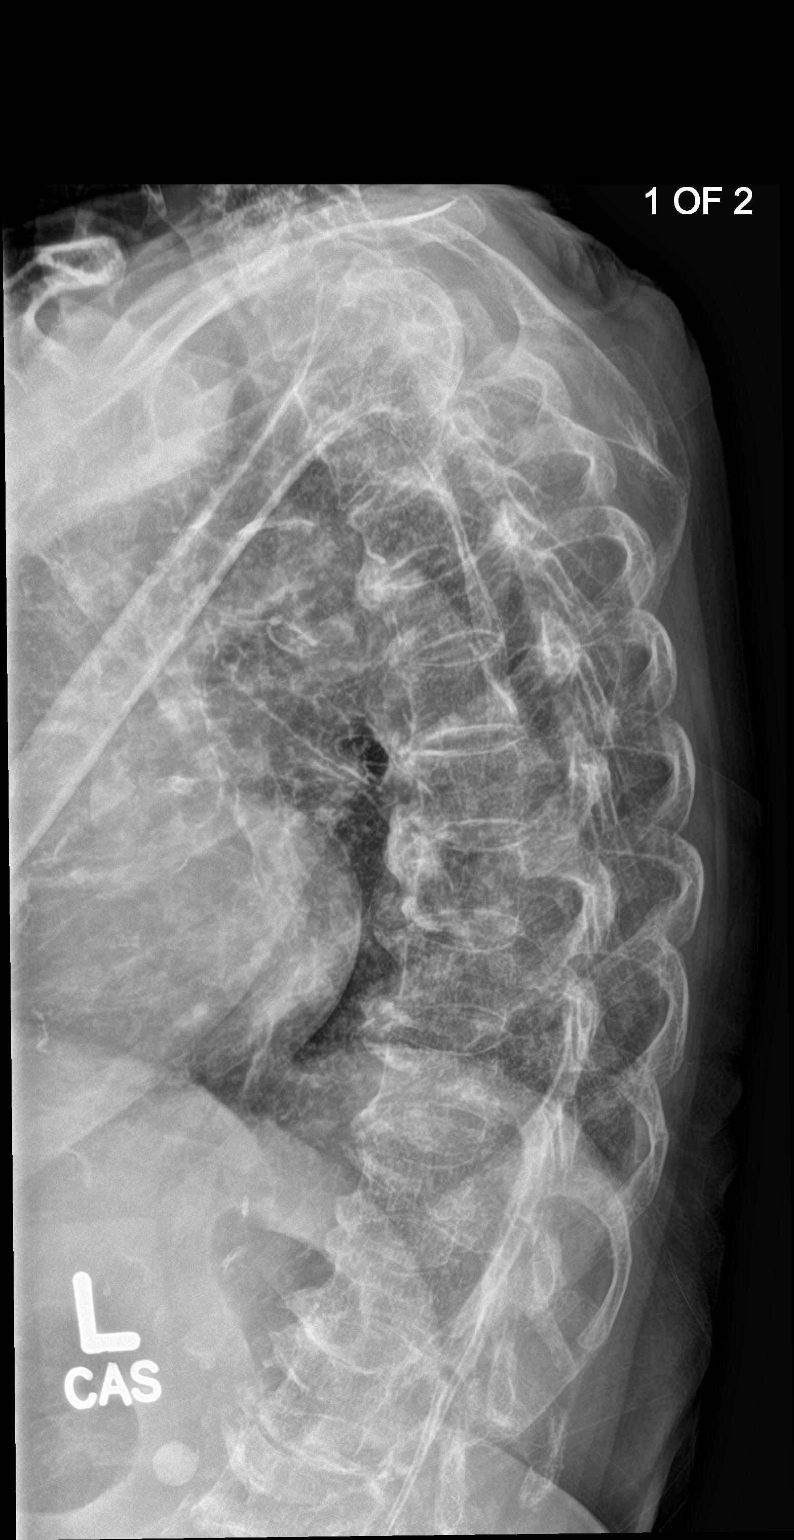

[3 of 3 positions shown; findings below may reference images not displayed]

FINDINGS: Mild S-shaped scoliosis in the spine. Dextroscoliosis of the
thoracic spine with levoscoliosis of the upper lumbar spine.
Vertebral body heights are maintained. No evidence for a compression
fracture. Atherosclerotic calcifications in the aorta.
IMPRESSION: 1. No acute abnormality in the thoracic spine.
2. Scoliosis involving the thoracic and lumbar spine.

## 2021-03-13 IMAGING — CR RIGHT SHOULDER - 2+ VIEW
3 series · 3 of 3 positions shown · non-contrast
Comparison: Chest x-ray 03/14/2016

CLINICAL DATA: Status post fall, bilateral shoulder pain

EXAM:
RIGHT SHOULDER - 2+ VIEW

[shoulder grashey (1 of 2)]
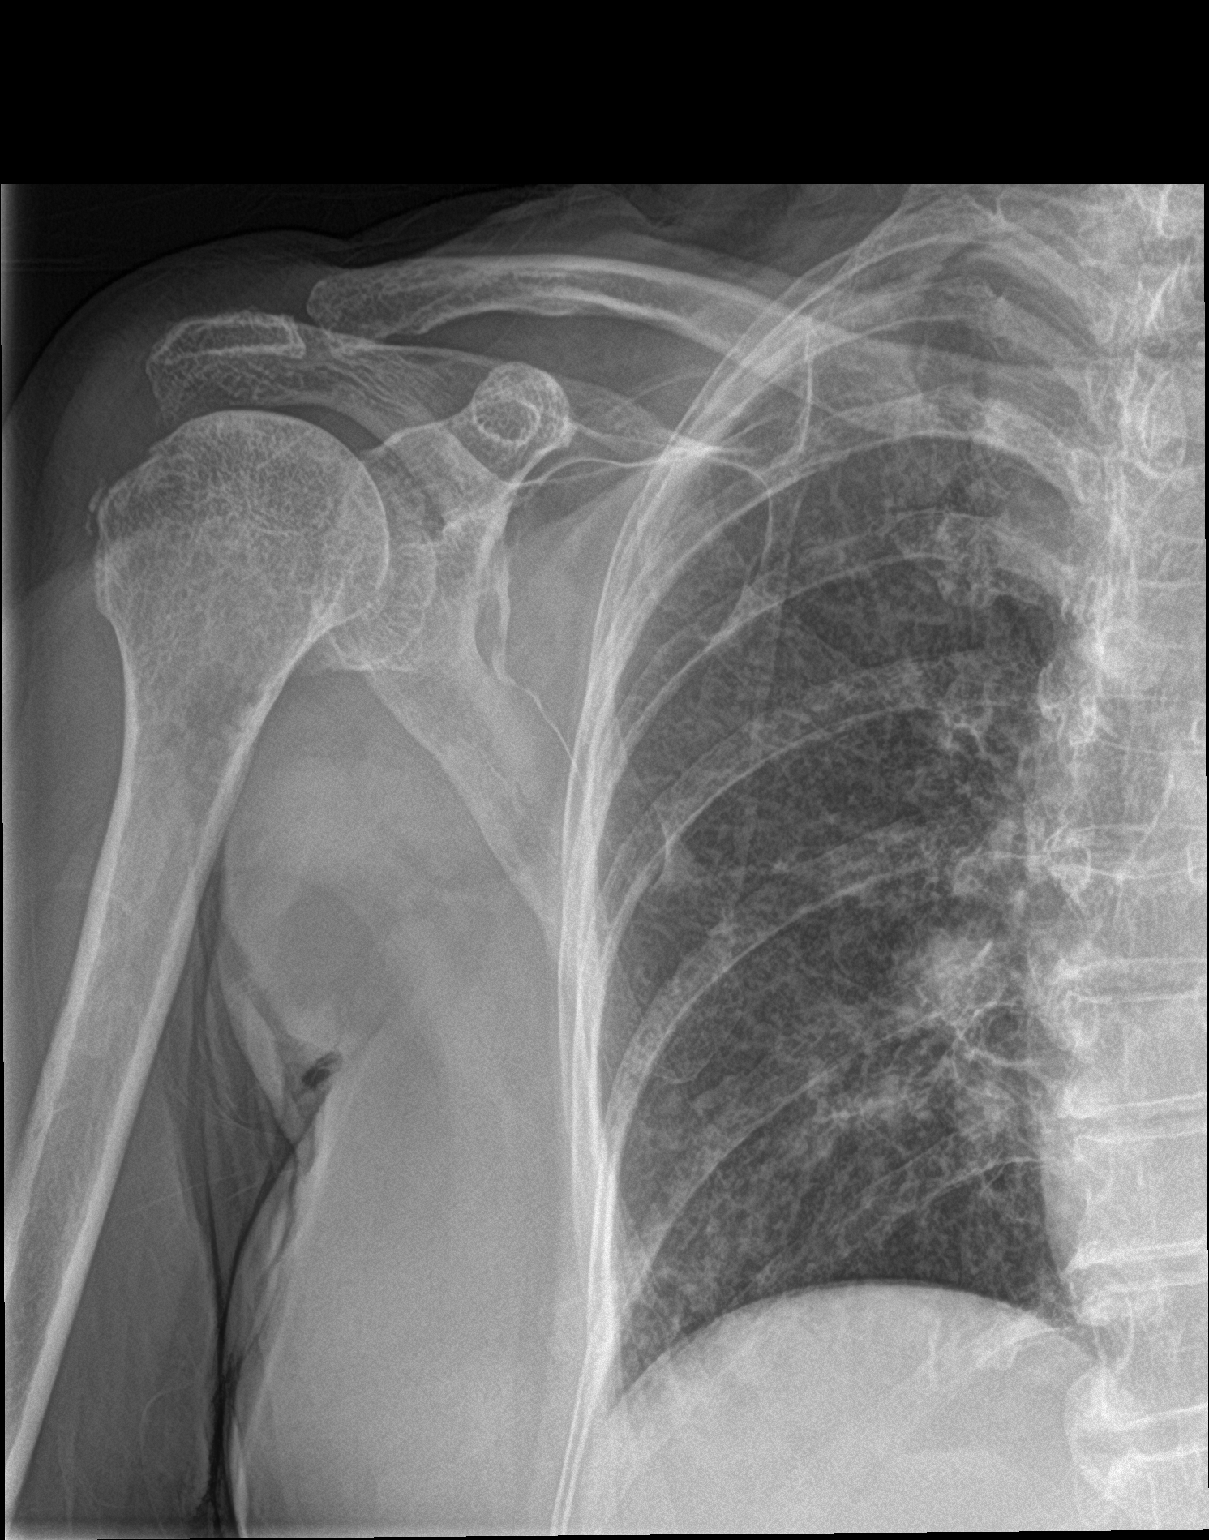

[shoulder y view]
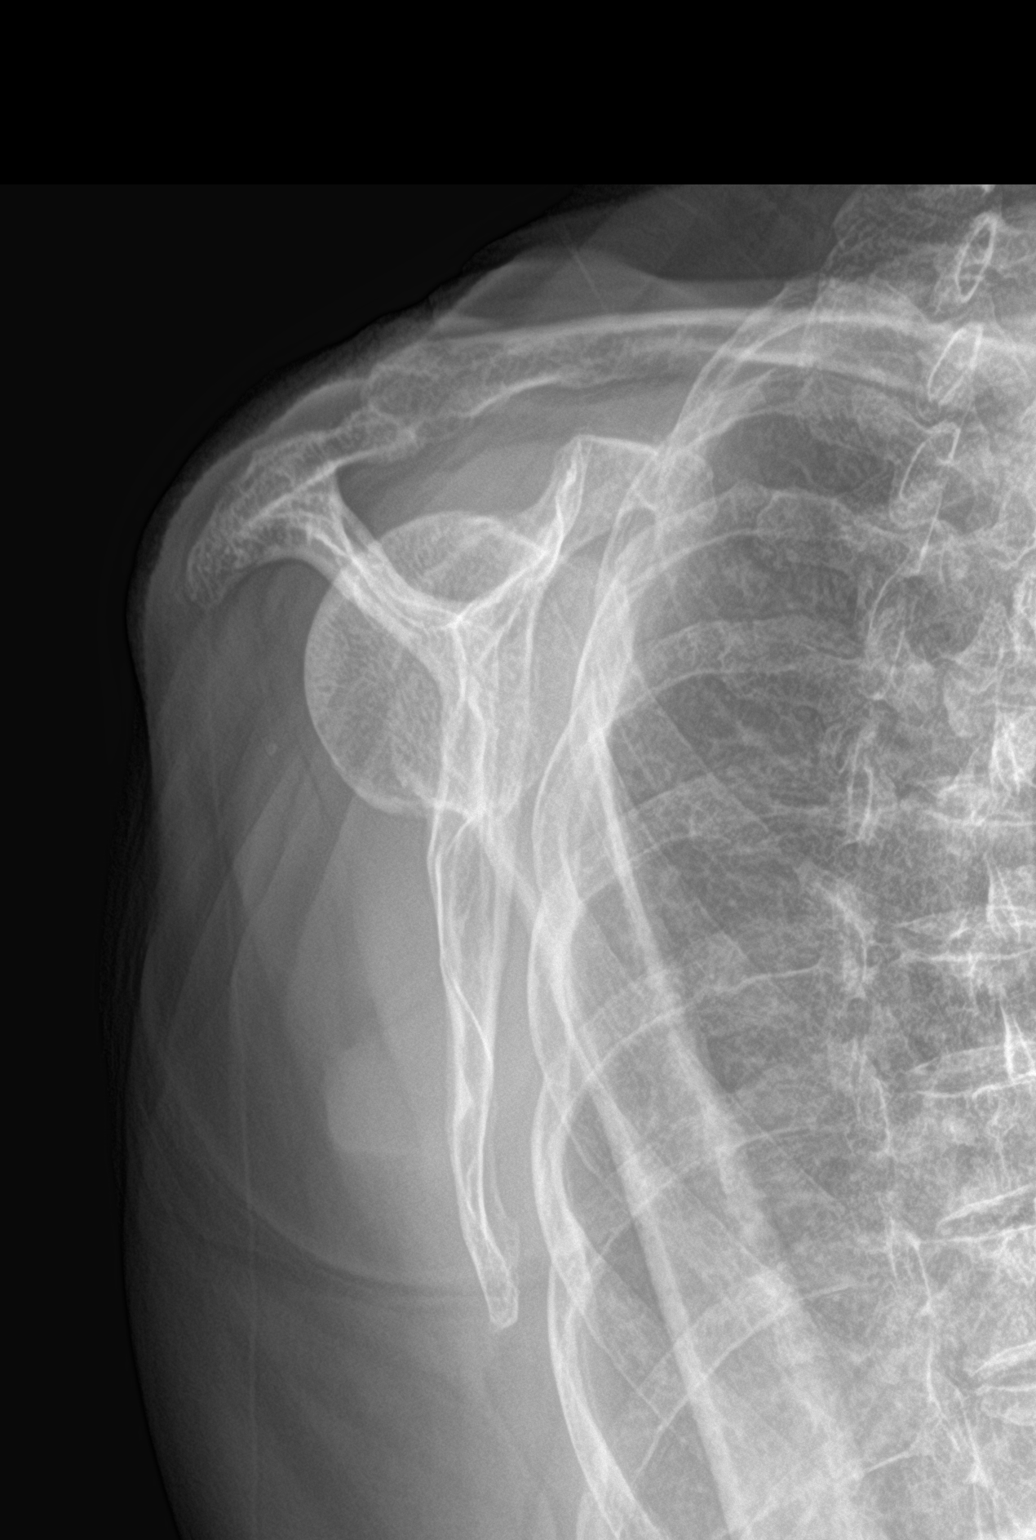

[shoulder grashey (2 of 2)]
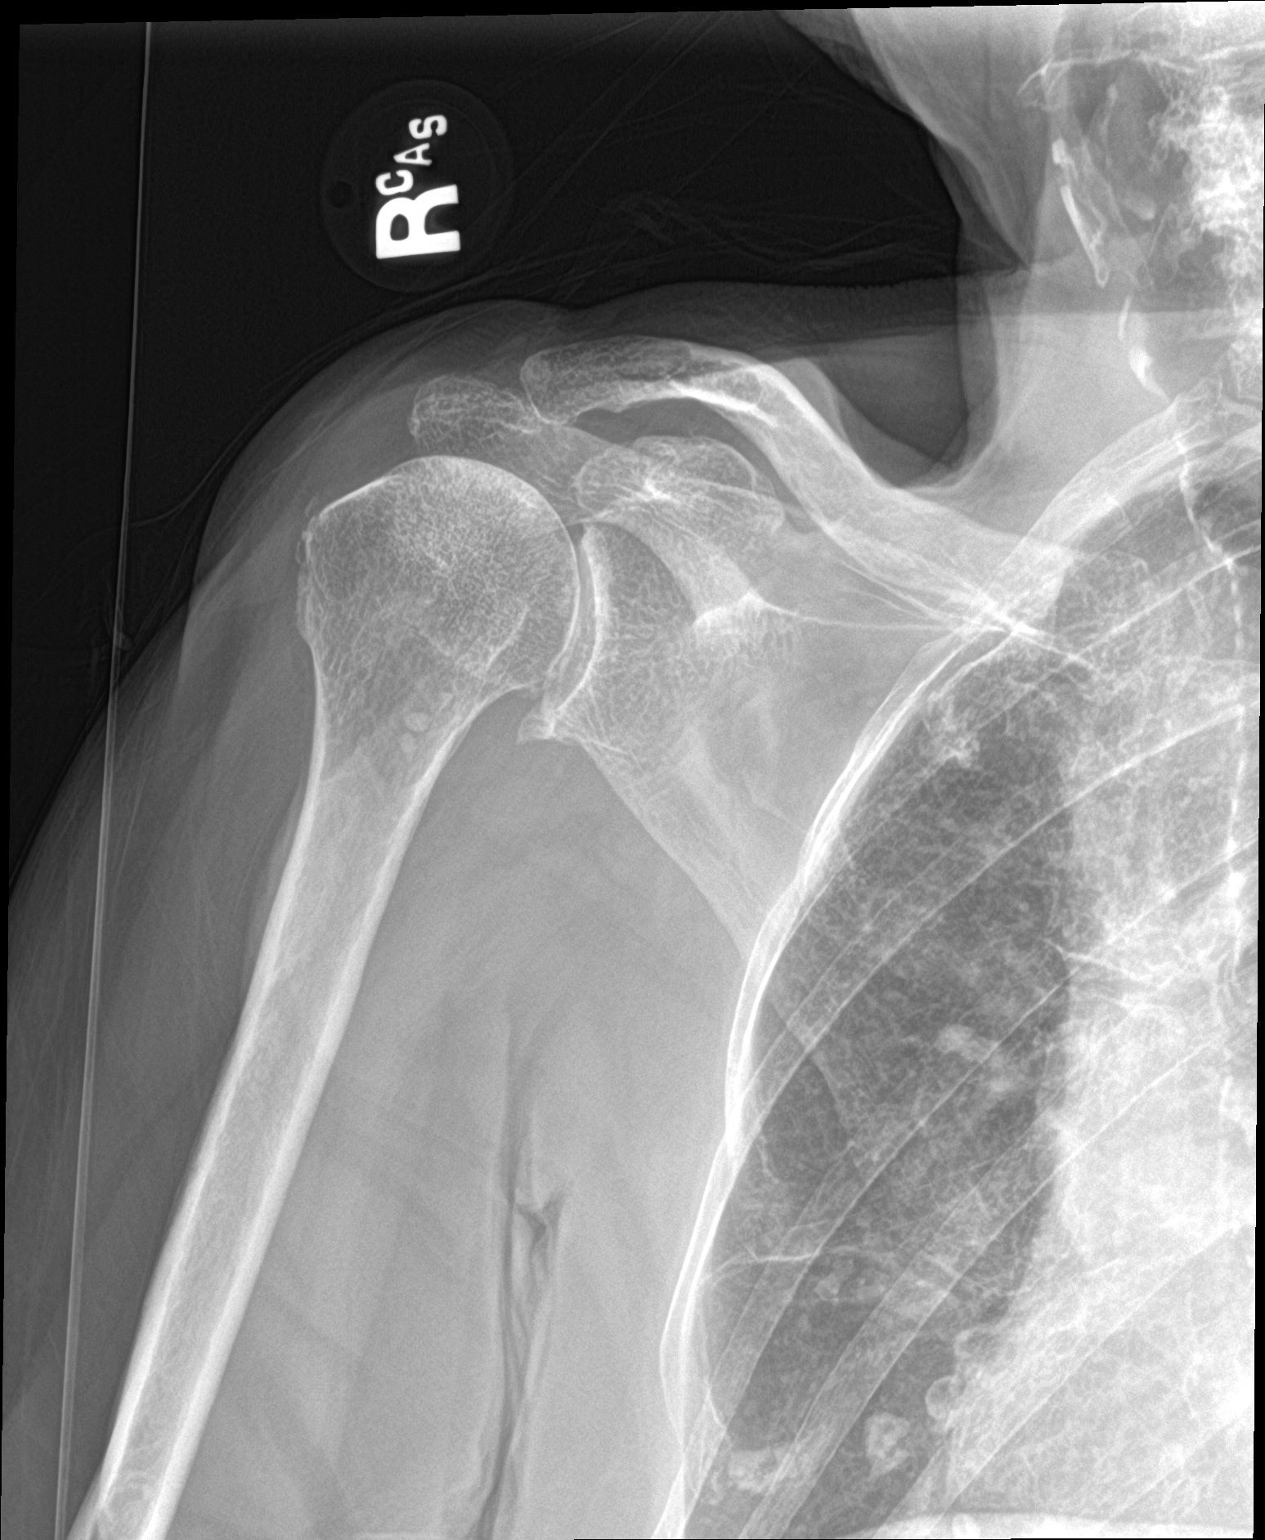

[3 of 3 positions shown; findings below may reference images not displayed]

FINDINGS: Generalized osteopenia. No acute fracture or dislocation. Mild
osteoarthritis of the glenohumeral joint. Mild arthropathy of the
acromioclavicular joint. No aggressive osseous lesion.
IMPRESSION: No acute osseous injury of the right shoulder.

## 2021-03-13 IMAGING — CT CT HEAD WITHOUT CONTRAST
5 of 7 series · 16 of 47 positions shown, 17 images · non-contrast
Comparison: 10/06/2016 brain MRI.

CLINICAL DATA: Fall 2 days prior. Base of neck, bilateral shoulder
and upper back pain.

EXAM:
CT HEAD WITHOUT CONTRAST
CT CERVICAL SPINE WITHOUT CONTRAST
TECHNIQUE: Multidetector CT imaging of the head and cervical spine was
performed following the standard protocol without intravenous
contrast. Multiplanar CT image reconstructions of the cervical spine
were also generated.

[Series 2: head wo · axial · 0.47mm/px · z∈[-105,-60]mm · 2 of 29 slices shown, 3 images]
[im 10/29  brain]
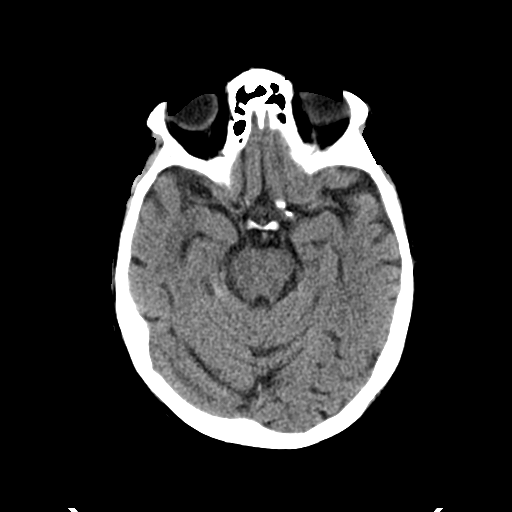
[im 10/29  bone]
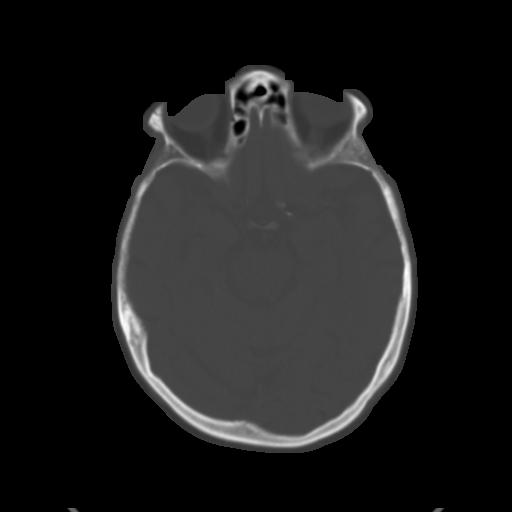
[im 19/29  brain]
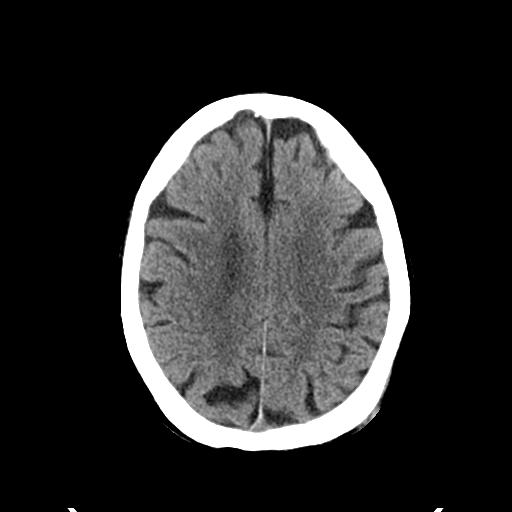

[Series 4: coronal soft tissue · coronal · 0.30mm/px · 3 of 66 slices shown]
[im 10/66  brain]
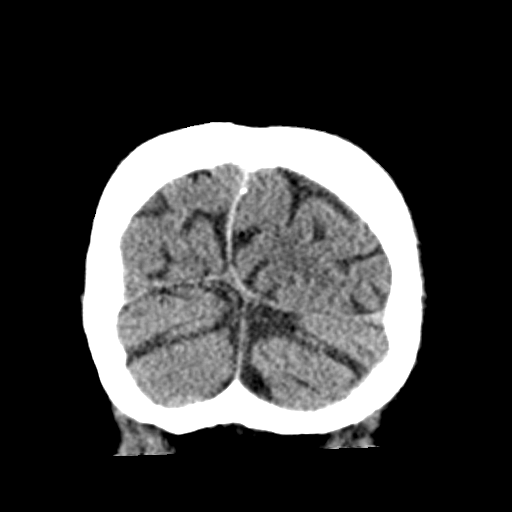
[im 15/66  brain]
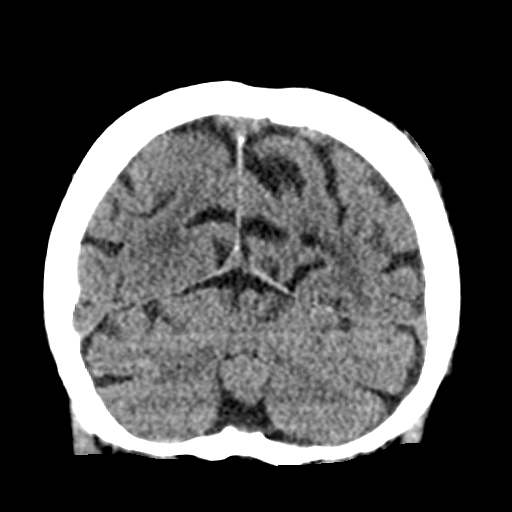
[im 19/66  brain]
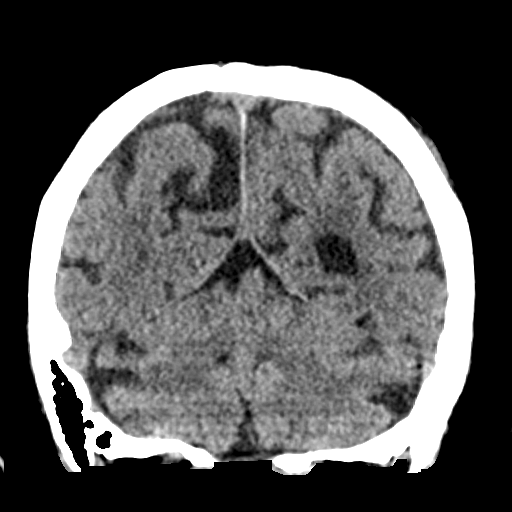

[Series 5: sagittal soft tissue · sagittal · 0.30mm/px · 1 of 50 slices shown]
[im 25/50  brain]
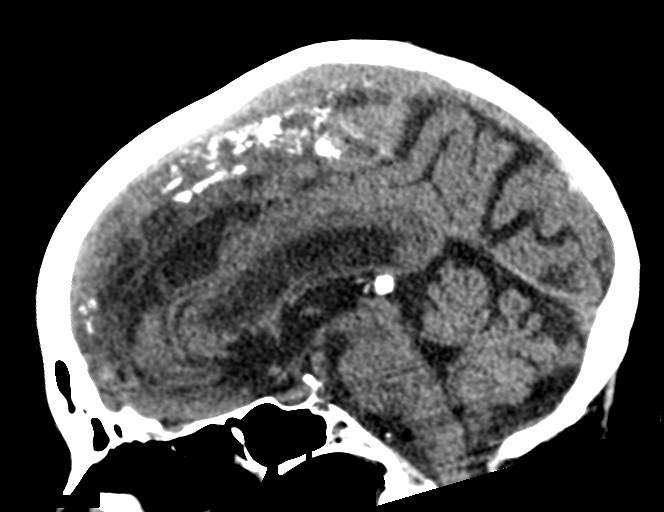

[Series 7: c spine soft · axial · 0.31mm/px · z∈[-273,-259]mm · 2 of 74 slices shown]
[im 7/74  brain]
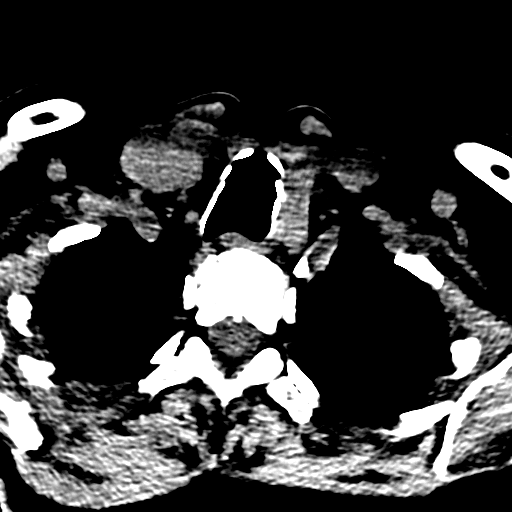
[im 14/74  brain]
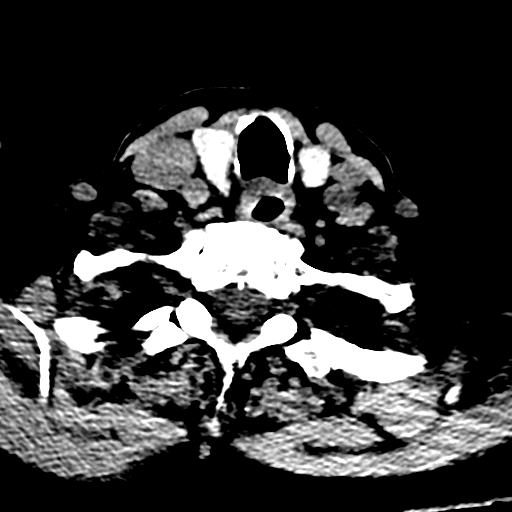

[Series 10: orthogonal bone · axial · 0.22mm/px · z∈[-305,-172]mm · 8 of 89 slices shown]
[im 7/89  bone]
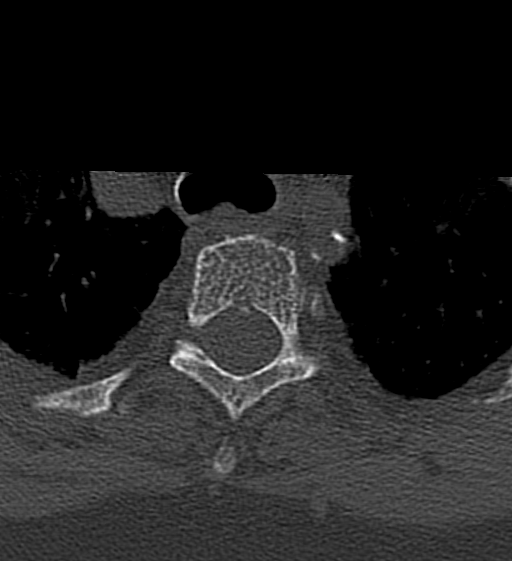
[im 21/89  bone]
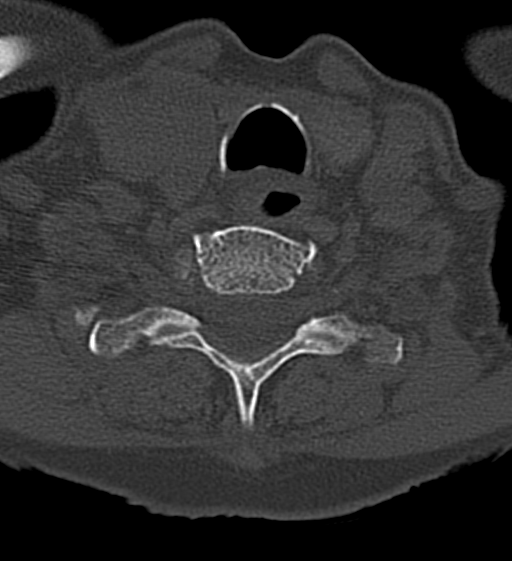
[im 28/89  bone]
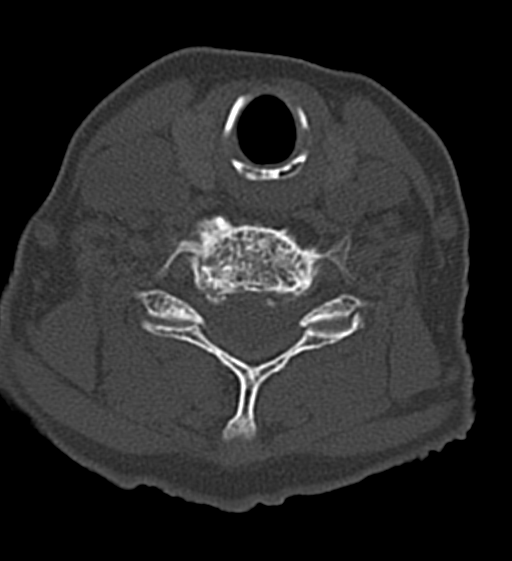
[im 41/89  bone]
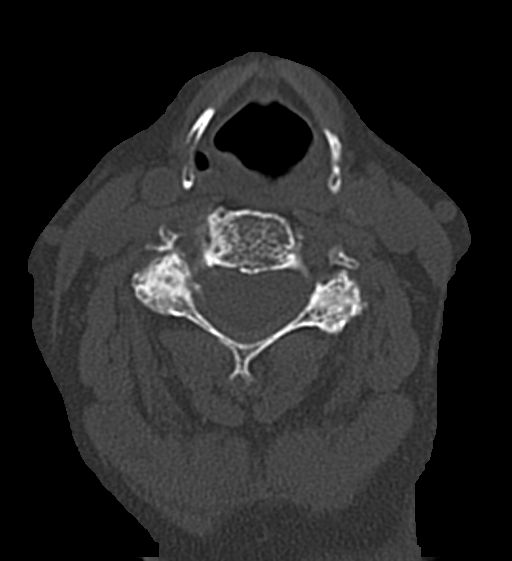
[im 48/89  bone]
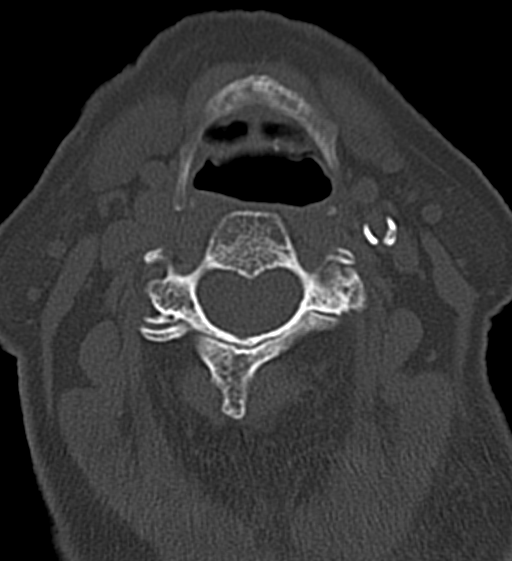
[im 61/89  bone]
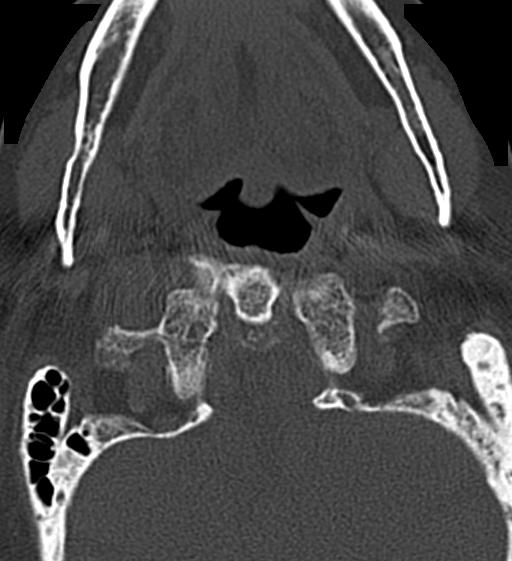
[im 68/89  bone]
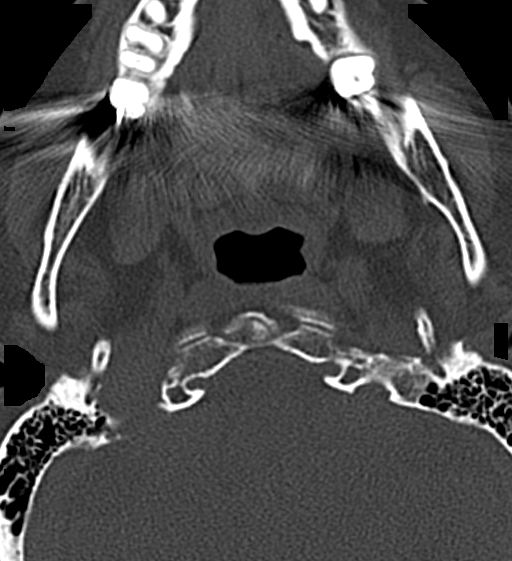
[im 82/89  bone]
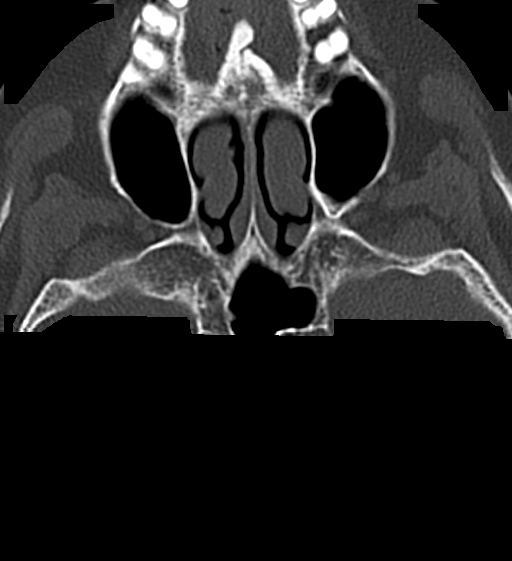

[16 of 47 positions shown; findings below may reference images not displayed]

FINDINGS: CT HEAD FINDINGS

Brain: No evidence of parenchymal hemorrhage or extra-axial fluid
collection. No mass lesion, mass effect, or midline shift. No CT
evidence of acute infarction. Generalized cerebral volume loss.
Nonspecific mild subcortical and periventricular white matter
hypodensity, most in keeping with chronic small vessel ischemic
change. No ventriculomegaly.

Vascular: No acute abnormality.

Skull: No evidence of calvarial fracture.

Sinuses/Orbits: The visualized paranasal sinuses are essentially
clear.

Other:  The mastoid air cells are unopacified.

CT CERVICAL SPINE FINDINGS

Alignment: Straightening of the cervical spine. No facet
subluxation. Dens is well positioned between the lateral masses of
C1. Minimal 2 mm anterolisthesis at C3-4 and C5-6.

Skull base and vertebrae: Nondisplaced fracture of the tip of
transverse right T1 process. No cervical spine fracture. No primary
bone lesion or focal pathologic process.

Soft tissues and spinal canal: No prevertebral edema. No visible
canal hematoma.

Disc levels: Marked multilevel degenerative disc disease throughout
the cervical spine, most prominent at C6-7. Advanced bilateral facet
arthropathy. Mild degenerative foraminal stenosis on the right at
C4-5.

Upper chest: No acute abnormality.

Other: Visualized mastoid air cells appear clear. No discrete
thyroid nodules. No pathologically enlarged cervical nodes.
IMPRESSION: 1. No evidence of acute intracranial abnormality. No evidence of
calvarial fracture.
2. Generalized cerebral volume loss and mild chronic small vessel
ischemic changes in the cerebral white matter.
3. Nondisplaced fracture of the tip of the right T1 transverse
process. No fracture within the cervical spine. No facet
subluxation.
4. Marked multilevel degenerative changes in the cervical spine as
detailed.

## 2021-03-13 IMAGING — CT CT THORACIC SPINE WITHOUT CONTRAST
3 of 6 series · 14 of 33 positions shown, 16 images · non-contrast
Comparison: None.

CLINICAL DATA: Fall 2 days ago with back pain bilateral shoulder
pain since a fall.

EXAM:
CT THORACIC SPINE WITHOUT CONTRAST
TECHNIQUE: Multidetector CT images of the thoracic were obtained using the
standard protocol without intravenous contrast.

[Series 4: t spine soft · axial · 0.26mm/px · z∈[-224,-8]mm · 8 of 136 slices shown, 10 images]
[im 14/136  soft-tissue]
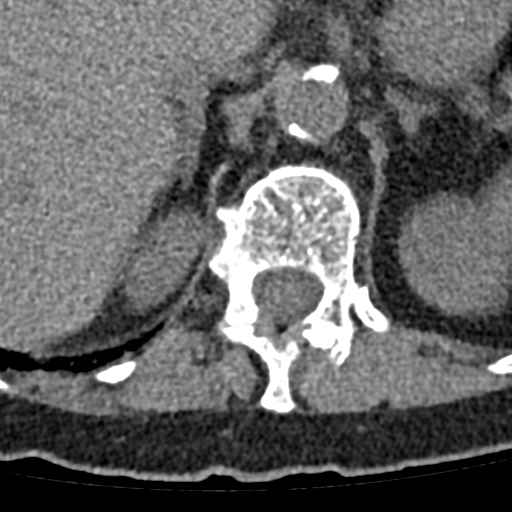
[im 14/136  bone]
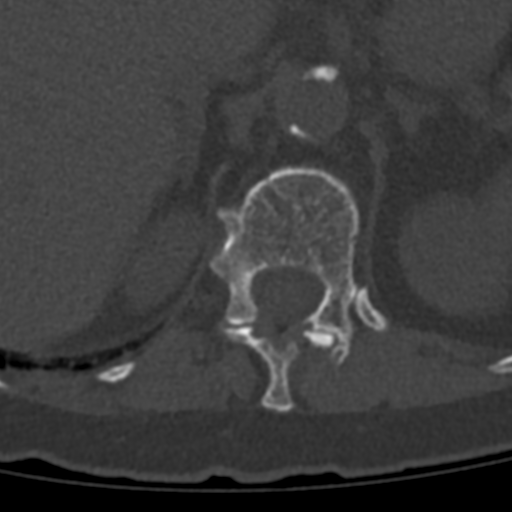
[im 28/136  bone]
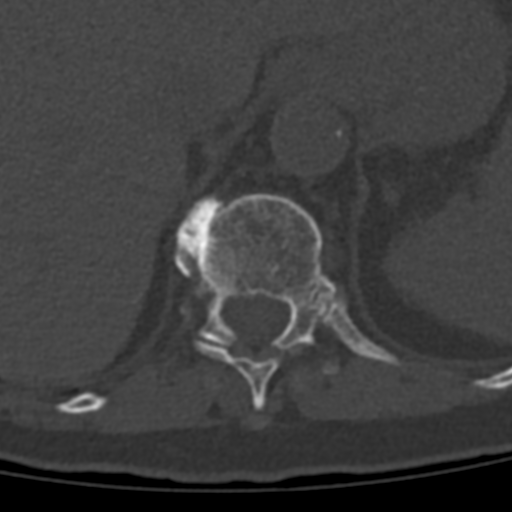
[im 41/136  bone]
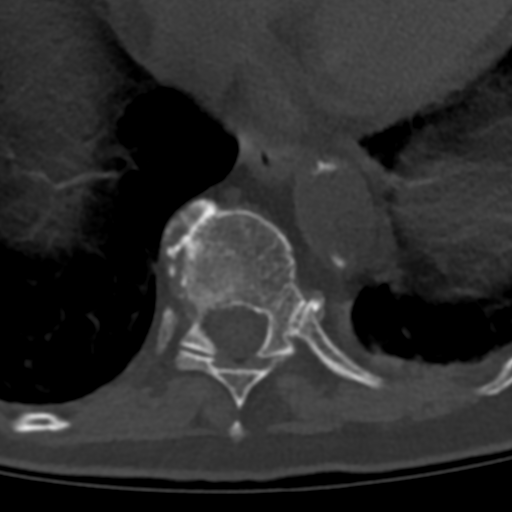
[im 55/136  bone]
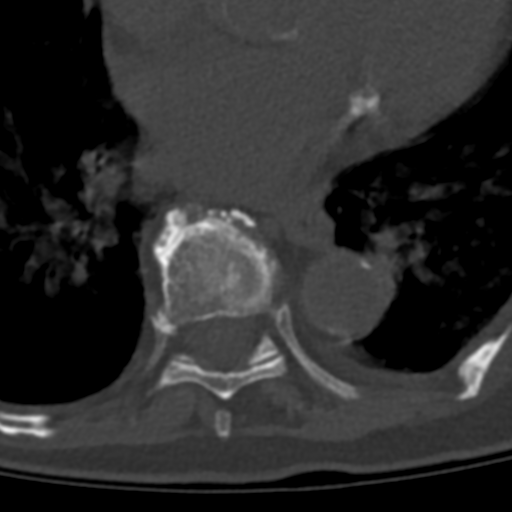
[im 82/136  soft-tissue]
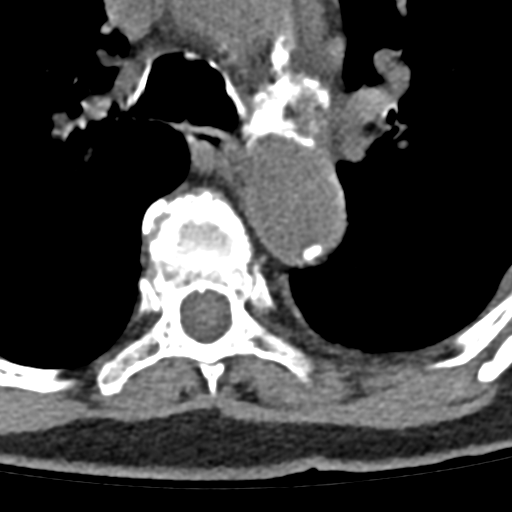
[im 82/136  bone]
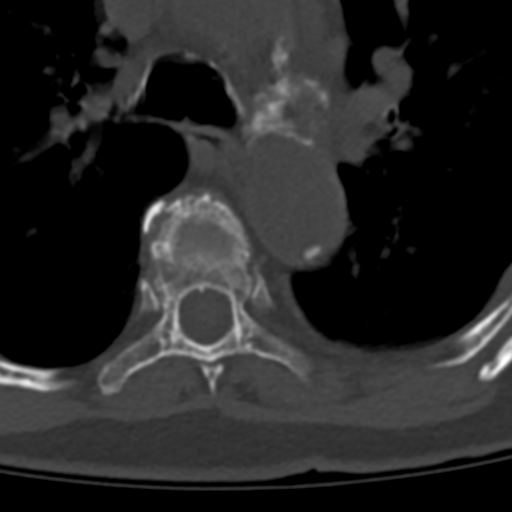
[im 95/136  bone]
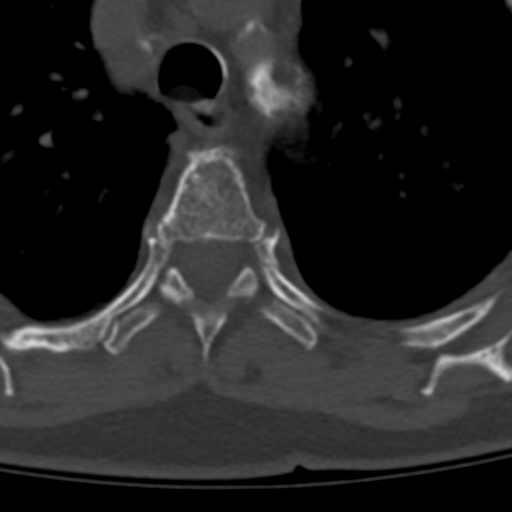
[im 109/136  bone]
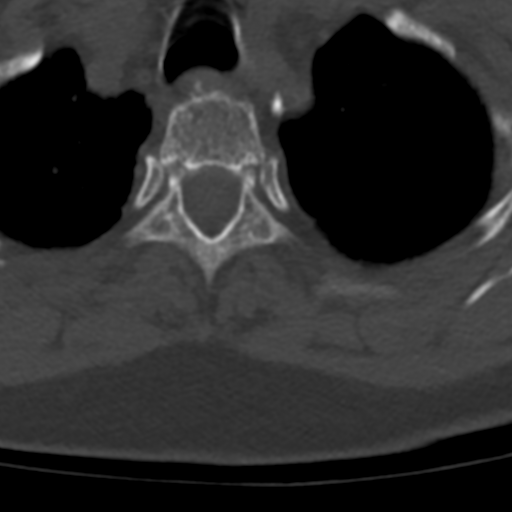
[im 122/136  bone]
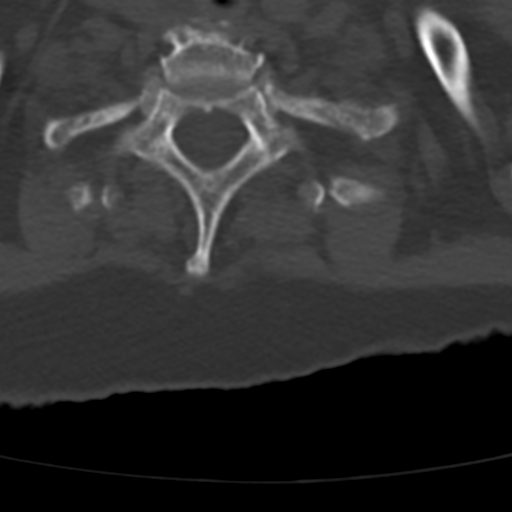

[Series 7: coronal bone · coronal · 0.16mm/px · 1 of 83 slices shown]
[im 42/83  bone]
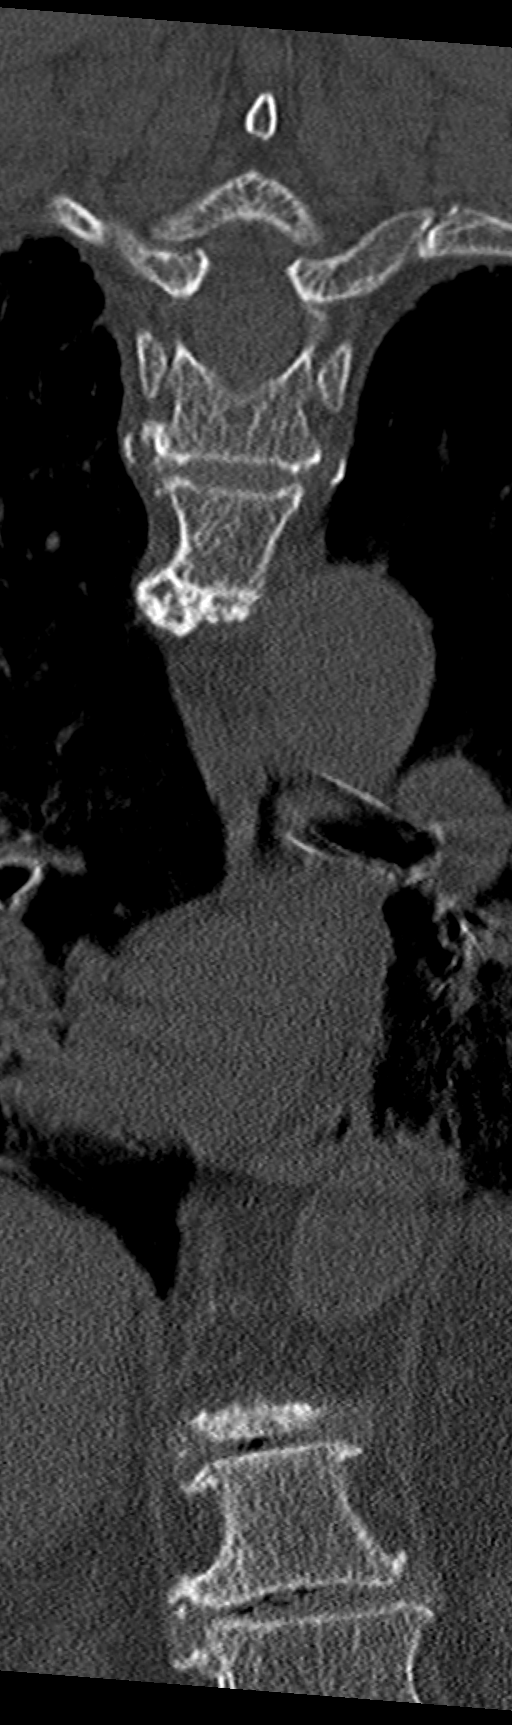

[Series 11: sagittal bone · sagittal · 0.20mm/px · 5 of 55 slices shown]
[im 10/55  bone]
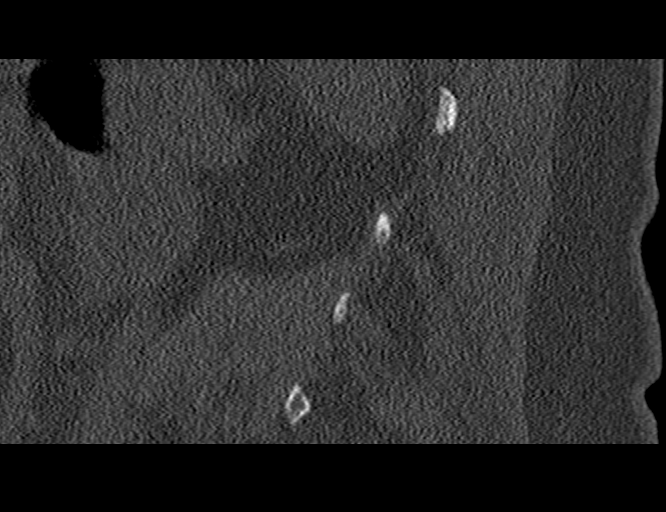
[im 19/55  bone]
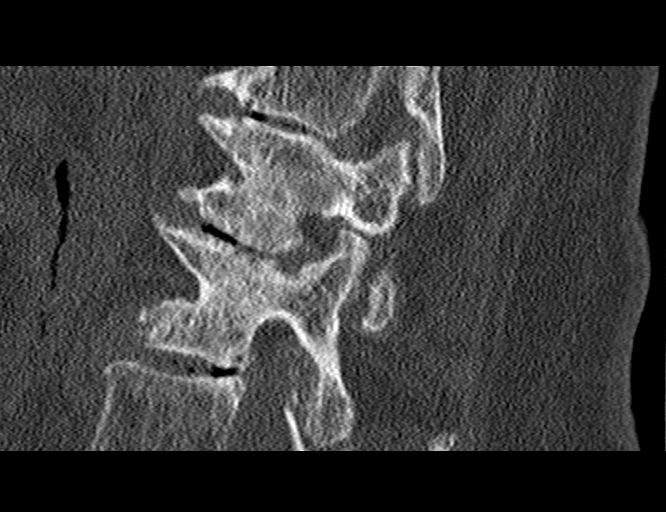
[im 28/55  bone]
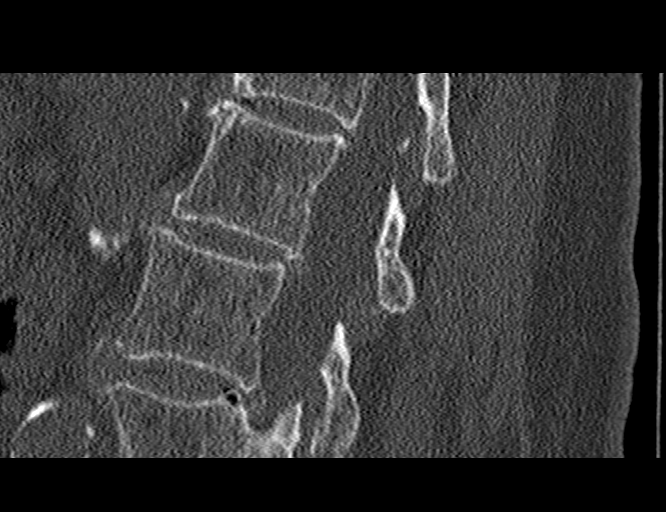
[im 37/55  bone]
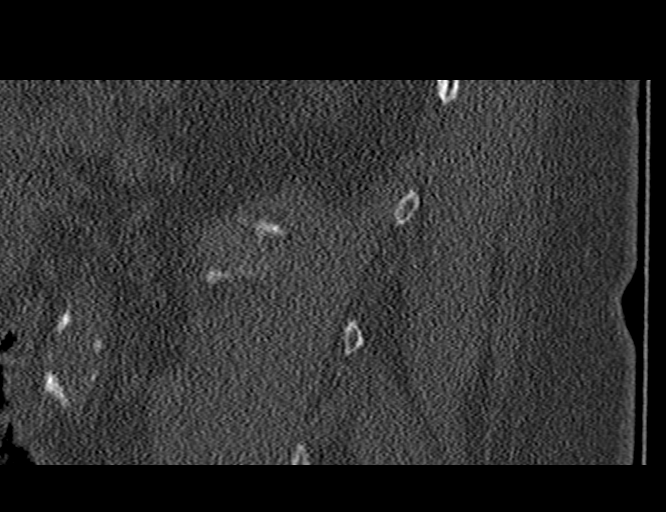
[im 46/55  bone]
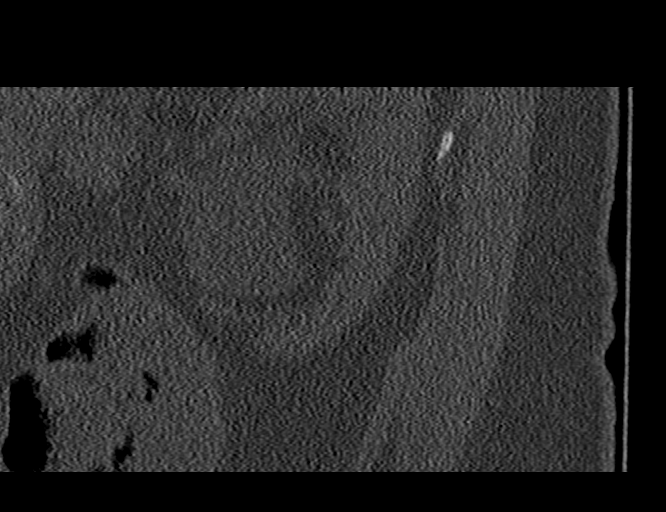

[14 of 33 positions shown; findings below may reference images not displayed]

FINDINGS: Alignment: No traumatic malalignment. Mild degenerative thoracic
dextrocurvature.

Vertebrae: Negative for vertebral fracture. The posterior left
eighth rib is fractured on a chronic basis when compared with
09/30/2017 chest CT.

Paraspinal and other soft tissues: No acute finding in the lungs
when allowing for motion artifact. There is trace pleural fluid or
pleural thickening in the posterior left chest. Atherosclerosis.

Disc levels: Diffuse degenerative disc narrowing and endplate
spurring
IMPRESSION: No acute finding.  Negative for thoracic spine fracture.

## 2021-04-25 ENCOUNTER — Encounter: Payer: Medicare Other | Admitting: Student in an Organized Health Care Education/Training Program

## 2021-05-01 ENCOUNTER — Telehealth: Payer: Self-pay | Admitting: Student in an Organized Health Care Education/Training Program

## 2021-05-01 ENCOUNTER — Other Ambulatory Visit: Payer: Self-pay | Admitting: Student in an Organized Health Care Education/Training Program

## 2021-05-01 DIAGNOSIS — Z79891 Long term (current) use of opiate analgesic: Secondary | ICD-10-CM

## 2021-05-01 DIAGNOSIS — G894 Chronic pain syndrome: Secondary | ICD-10-CM

## 2021-05-01 NOTE — Telephone Encounter (Signed)
I got this msg sent back to me but no instructions?

## 2021-05-01 NOTE — Telephone Encounter (Signed)
Schedule her a VV

## 2021-05-01 NOTE — Telephone Encounter (Signed)
Patient called this morning asking if she could do a phone call appointment. She is having trouble with uncontrolled bowl movements and is unable to come in at this time. That is also why she missed her last appt. Please advise ? She is out of pain meds.

## 2021-05-03 ENCOUNTER — Ambulatory Visit
Payer: Medicare Other | Attending: Student in an Organized Health Care Education/Training Program | Admitting: Student in an Organized Health Care Education/Training Program

## 2021-05-03 ENCOUNTER — Other Ambulatory Visit: Payer: Self-pay

## 2021-05-03 ENCOUNTER — Encounter: Payer: Self-pay | Admitting: Student in an Organized Health Care Education/Training Program

## 2021-05-03 DIAGNOSIS — Z79891 Long term (current) use of opiate analgesic: Secondary | ICD-10-CM | POA: Diagnosis not present

## 2021-05-03 DIAGNOSIS — G894 Chronic pain syndrome: Secondary | ICD-10-CM

## 2021-05-03 DIAGNOSIS — M19011 Primary osteoarthritis, right shoulder: Secondary | ICD-10-CM | POA: Diagnosis not present

## 2021-05-03 DIAGNOSIS — M5136 Other intervertebral disc degeneration, lumbar region: Secondary | ICD-10-CM | POA: Diagnosis not present

## 2021-05-03 DIAGNOSIS — M1712 Unilateral primary osteoarthritis, left knee: Secondary | ICD-10-CM

## 2021-05-03 DIAGNOSIS — M51369 Other intervertebral disc degeneration, lumbar region without mention of lumbar back pain or lower extremity pain: Secondary | ICD-10-CM

## 2021-05-03 DIAGNOSIS — M5030 Other cervical disc degeneration, unspecified cervical region: Secondary | ICD-10-CM

## 2021-05-03 DIAGNOSIS — S22009S Unspecified fracture of unspecified thoracic vertebra, sequela: Secondary | ICD-10-CM

## 2021-05-03 DIAGNOSIS — M19012 Primary osteoarthritis, left shoulder: Secondary | ICD-10-CM

## 2021-05-03 MED ORDER — HYDROCODONE-ACETAMINOPHEN 5-325 MG PO TABS: 1.0000 | ORAL_TABLET | Freq: Every day | ORAL | 0 refills | Status: DC | PRN

## 2021-05-03 NOTE — Progress Notes (Signed)
Patient: Susan Blackwell  Service Category: E/M  Provider: Gillis Santa, MD  DOB: 05-Jan-1920  DOS: 05/03/2021  Location: Office  MRN: [id number]  Setting: Ambulatory outpatient  Referring Provider: Kirk Ruths, MD  Type: Established Patient  Specialty: Interventional Pain Management  PCP: Kirk Ruths, MD  Location: Home  Delivery: TeleHealth     Virtual Encounter - Pain Management PROVIDER NOTE: Information contained herein reflects review and annotations entered in association with encounter. Interpretation of such information and data should be left to medically-trained personnel. Information provided to patient can be located elsewhere in the medical record under "Patient Instructions". Document created using STT-dictation technology, any transcriptional errors that may result from process are unintentional.    Contact & Pharmacy Preferred: [place]: [phone] (home) Mobile: [phone] (mobile) E-mail: No e-mail address on record  [place] [place] [facility] [place] [address] [place] [address] Phone: [phone] Fax: [phone]   Pre-screening  Susan Blackwell offered "in-person" vs "virtual" encounter. She indicated preferring virtual for this encounter.   Reason COVID-19*   Social distancing based on CDC and AMA recommendations.   I contacted Jernee Murtaugh on 05/03/2021 via telephone.      I clearly identified myself as Gillis Santa, MD. I verified that I was speaking with the correct person using two identifiers (Name: Susan Blackwell, and date of birth: November 04, 1919).  Consent I sought verbal advanced consent from Susan Blackwell for virtual visit interactions. I informed Susan Blackwell of possible security and privacy concerns, risks, and limitations associated with providing "not-in-person" medical evaluation and management services. I also informed Susan Blackwell of the availability of "in-person" appointments. Finally, I informed  her that there would be a charge for the virtual visit and that she could be  personally, fully or partially, financially responsible for it. Susan Blackwell expressed understanding and agreed to proceed.   Historic Elements   Susan Blackwell is a 88 y.o. year old, female patient evaluated today after our last contact on 05/01/2021. Susan Blackwell  has a past medical history of Bile duct stone (02/2016), Hypertension, and PONV (postoperative nausea and vomiting). She also  has a past surgical history that includes Cholecystectomy; ERCP (N/A, 03/11/2016); Breast biopsy; ERCP (N/A, 06/18/2016); Gastrointestinal stent removal (N/A, 06/18/2016); Spyglass cholangioscopy (N/A, 06/18/2016); and spyglass lithotripsy (N/A, 06/18/2016). Susan Blackwell has a current medication list which includes the following prescription(s): acetaminophen, caltrate 600+d plus minerals, cholecalciferol, fluticasone, arnicare, hydrochlorothiazide, losartan, metoprolol tartrate, pantoprazole, potassium chloride, tetrahydrozoline hcl, triamcinolone ointment, vitamin b-12, gentamicin cream, hydrocodone-acetaminophen, hydrocodone-acetaminophen, [START ON 06/02/2021] hydrocodone-acetaminophen, [START ON 07/02/2021] hydrocodone-acetaminophen, meclizine, and sodium chloride, and the following Facility-Administered Medications: cefoTEtan (CEFOTAN) 1 g in dextrose 5 % 50 mL IVPB. She  reports that she has never smoked. She has never used smokeless tobacco. She reports that she does not drink alcohol and does not use drugs. Susan Blackwell is allergic to tramadol and nsaids.   HPI  Today, she is being contacted for medication management.  No change in medical history since last visit.  Patient's pain is at baseline.  Patient continues multimodal pain regimen as prescribed.  States that it provides pain relief and improvement in functional status.   Pharmacotherapy Assessment   Opioid Analgesic: Hydrocodone 5 mg daily as needed, quantity 30/month; MME 5     Monitoring: [place] PMP: PDMP reviewed during this encounter.       Pharmacotherapy: No side-effects or adverse reactions reported. Compliance: No problems identified. Effectiveness: Clinically acceptable. Plan: Refer to "POC".  UDS:  Summary  Date Value Ref Range Status  01/26/2021 Note  Final    Comment:    ==================================================================== [employer] Select 13 (MW) ==================================================================== Test                             Result       Flag       Units  Drug Present and Declared for Prescription Verification   Hydrocodone                    368          EXPECTED   ng/mg creat   Norhydrocodone                 823          EXPECTED   ng/mg creat    Sources of hydrocodone include scheduled prescription medications.    Norhydrocodone is an expected metabolite of hydrocodone.  ==================================================================== Test                      Result    Flag   Units      Ref Range   Creatinine              31               mg/dL      >=20 ==================================================================== Declared Medications:  The flagging and interpretation on this report are based on the  following declared medications.  Unexpected results may arise from  inaccuracies in the declared medications.   **Note: The testing scope of this panel includes these medications:   Hydrocodone (Norco)   **Note: The testing scope of this panel does not include the  following reported medications:   Acetaminophen  Acetaminophen (Norco)  Calcium  Cholecalciferol  Cyanocobalamin  Fluticasone (Flonase)  Hydrochlorothiazide (Hydrodiuril)  Losartan (Cozaar)  Meclizine (Antivert)  Pantoprazole (Protonix)  Potassium (Klor-Con)  Sodium Chloride  Topical  Triamcinolone (Kenalog)  Vitamin D ==================================================================== For clinical consultation,  please call [phone]. ====================================================================      Laboratory Chemistry Profile   Renal Lab Results  Component Value Date   BUN 14 11/07/2020   CREATININE 0.76 11/07/2020   GFRAA 57 (L) 09/02/2019   GFRNONAA >60 11/07/2020    Hepatic Lab Results  Component Value Date   AST 22 11/07/2020   ALT 15 11/07/2020   ALBUMIN 4.1 11/07/2020   ALKPHOS 53 11/07/2020   LIPASE 32 11/07/2020    Electrolytes Lab Results  Component Value Date   NA 137 11/07/2020   K 3.8 11/07/2020   CL 104 11/07/2020   CALCIUM 9.3 11/07/2020    Bone No results found for: VD25OH, VD125OH2TOT, QJ1941DE0, CX4481EH6, 25OHVITD1, 25OHVITD2, 25OHVITD3, TESTOFREE, TESTOSTERONE  Inflammation (CRP: Acute Phase) (ESR: Chronic Phase) Lab Results  Component Value Date   LATICACIDVEN 1.4 09/02/2019         Note: Above Lab results reviewed.  Imaging  CT Head Wo Contrast CLINICAL DATA:  88 year old female with dizziness for 4 days.  EXAM: CT HEAD WITHOUT CONTRAST  TECHNIQUE: Contiguous axial images were obtained from the base of the skull through the vertex without intravenous contrast.  COMPARISON:  Brain MRI 09/26/2016.  Head CT 08/23/2018.  FINDINGS: Brain: Cerebral volume is not significantly changed since 2020. No midline shift, ventriculomegaly, mass effect, evidence of mass lesion, intracranial hemorrhage or evidence of cortically based acute infarction. Gray-white matter differentiation is  stable and within normal limits for age. No cortical encephalomalacia identified.  Vascular: Calcified atherosclerosis at the skull base. No suspicious intracranial vascular hyperdensity.  Skull: No acute osseous abnormality identified.  Sinuses/Orbits: Visualized paranasal sinuses and mastoids are stable and well aerated. Tympanic cavities remain clear.  Other: No acute orbit or scalp soft tissue finding.  IMPRESSION: Stable and normal for age non  contrast CT appearance of the brain.  Electronically Signed   By: Genevie Ann M.D.   On: 11/07/2020 11:22  Assessment  The primary encounter diagnosis was Encounter for long-term opiate analgesic use. Diagnoses of Lumbar degenerative disc disease, Primary osteoarthritis of left knee, Primary osteoarthritis of both shoulders, Closed fracture of transverse process of thoracic vertebra, sequela, DDD (degenerative disc disease), cervical, and Chronic pain syndrome were also pertinent to this visit.  Plan of Care  Problem-specific:  No problem-specific Assessment & Plan notes found for this encounter.  Susan Blackwell has a current medication list which includes the following long-term medication(s): caltrate 600+d plus minerals, fluticasone, losartan, metoprolol tartrate, pantoprazole, potassium chloride, hydrocodone-acetaminophen, hydrocodone-acetaminophen, [START ON 06/02/2021] hydrocodone-acetaminophen, [START ON 07/02/2021] hydrocodone-acetaminophen, and sodium chloride.  Pharmacotherapy (Medications Ordered): Meds ordered this encounter  Medications   HYDROcodone-acetaminophen (NORCO/VICODIN) 5-325 MG tablet    Sig: Take 1 tablet by mouth daily as needed for moderate pain. For chronic pain syndrome    Dispense:  30 tablet    Refill:  0   HYDROcodone-acetaminophen (NORCO/VICODIN) 5-325 MG tablet    Sig: Take 1 tablet by mouth daily as needed for moderate pain. For chronic pain syndrome    Dispense:  30 tablet    Refill:  0   HYDROcodone-acetaminophen (NORCO/VICODIN) 5-325 MG tablet    Sig: Take 1 tablet by mouth daily as needed for moderate pain. For chronic pain syndrome    Dispense:  30 tablet    Refill:  0   Orders:  No orders of the defined types were placed in this encounter.  Follow-up plan:   Return in about 3 months (around 08/01/2021) for Medication Management, in person.    Recent Visits No visits were found meeting these conditions. Showing recent visits within past 90  days and meeting all other requirements Today's Visits Date Type Provider Dept  05/03/21 Office Visit Gillis Santa, MD [facility]  Showing today's visits and meeting all other requirements Future Appointments No visits were found meeting these conditions. Showing future appointments within next 90 days and meeting all other requirements  I discussed the assessment and treatment plan with the patient. The patient was provided an opportunity to ask questions and all were answered. The patient agreed with the plan and demonstrated an understanding of the instructions.  Patient advised to call back or seek an in-person evaluation if the symptoms or condition worsens.  Duration of encounter: 41mnutes.  Note by: BGillis Santa MD Date: 05/03/2021; Time: 1:31 PM

## 2021-07-27 ENCOUNTER — Other Ambulatory Visit: Payer: Self-pay

## 2021-07-27 ENCOUNTER — Ambulatory Visit
Payer: Medicare Other | Attending: Student in an Organized Health Care Education/Training Program | Admitting: Student in an Organized Health Care Education/Training Program

## 2021-07-27 ENCOUNTER — Encounter: Payer: Self-pay | Admitting: Student in an Organized Health Care Education/Training Program

## 2021-07-27 VITALS — BP 175/71 | HR 75 | Temp 97.9°F | Resp 16 | Ht <= 58 in | Wt 90.0 lb

## 2021-07-27 DIAGNOSIS — M19011 Primary osteoarthritis, right shoulder: Secondary | ICD-10-CM | POA: Insufficient documentation

## 2021-07-27 DIAGNOSIS — M19012 Primary osteoarthritis, left shoulder: Secondary | ICD-10-CM

## 2021-07-27 DIAGNOSIS — M5136 Other intervertebral disc degeneration, lumbar region: Secondary | ICD-10-CM | POA: Diagnosis not present

## 2021-07-27 DIAGNOSIS — Z79891 Long term (current) use of opiate analgesic: Secondary | ICD-10-CM | POA: Insufficient documentation

## 2021-07-27 DIAGNOSIS — G894 Chronic pain syndrome: Secondary | ICD-10-CM | POA: Insufficient documentation

## 2021-07-27 DIAGNOSIS — S22009S Unspecified fracture of unspecified thoracic vertebra, sequela: Secondary | ICD-10-CM | POA: Diagnosis not present

## 2021-07-27 MED ORDER — HYDROCODONE-ACETAMINOPHEN 5-325 MG PO TABS: 1.0000 | ORAL_TABLET | Freq: Every day | ORAL | 0 refills | Status: DC | PRN

## 2021-07-27 NOTE — Progress Notes (Signed)
Nursing Pain Medication Assessment:  ?Safety precautions to be maintained throughout the outpatient stay will include: orient to surroundings, keep bed in low position, maintain call bell within reach at all times, provide assistance with transfer out of bed and ambulation.  ?Medication Inspection Compliance: Pill count conducted under aseptic conditions, in front of the patient. Neither the pills nor the bottle was removed from the patient's sight at any time. Once count was completed pills were immediately returned to the patient in their original bottle. ? ?Medication: Hydrocodone/APAP ?Pill/Patch Count:  6 of 30 pills remain ?Pill/Patch Appearance: Markings consistent with prescribed medication ?Bottle Appearance: Standard pharmacy container. Clearly labeled. ?Filled Date: 03 / 13 / 2023 ?Last Medication intake:  Yesterday ?

## 2021-07-27 NOTE — Progress Notes (Signed)
PROVIDER NOTE: Information contained herein reflects review and annotations entered in association with encounter. Interpretation of such information and data should be left to medically-trained personnel. Information provided to patient can be located elsewhere in the medical record under "Patient Instructions". Document created using STT-dictation technology, any transcriptional errors that may result from process are unintentional.  ?  ?Patient: Susan Blackwell  Service Category: E/M  Provider: Gillis Santa, MD  ?DOB: [birth year]  DOS: 07/27/2021  Specialty: Interventional Pain Management  ?MRN: [id number]  Setting: Ambulatory outpatient  PCP: Kirk Ruths, MD  ?Type: Established Patient    Referring Provider: Kirk Ruths, MD  ?Location: Office  Delivery: Face-to-face    ? ?HPI  ?Ms. Elissia Spiewak, a 88 y.o. year old female, is here today because of her Encounter for long-term opiate analgesic use [Z79.891]. Ms. Mayers primary complain today is Shoulder Pain (Bilateral ), Hip Pain (Bilateral ), and Leg Pain (Bilateral ) ? ? ?Pertinent problems: Ms. Matus has Chronic pain syndrome; Closed fracture of transverse process of thoracic vertebra ([place]); DDD (degenerative disc disease), cervical; Lumbar degenerative disc disease; Primary osteoarthritis of both shoulders; and Encounter for long-term opiate analgesic use on their pertinent problem list. ?Pain Assessment: Severity of Chronic pain is reported as a 0-No pain/10. Location: Shoulder (see visit info for all pain sites.) Left, Right/shoulder pain into both arms and into the head. Onset: More than a month ago. Quality: Discomfort, Constant, Sharp. Timing: Constant. Modifying factor(s): medications, heat, massage. ?Vitals:  height is 4' 8"  (1.422 m) and weight is 90 lb (40.8 kg). Her temporal temperature is 97.9 ?F (36.6 ?C). Her blood pressure is 175/71 (abnormal) and her pulse is 75. Her respiration is 16 and oxygen saturation is 95%.  ? ?Reason for  encounter: medication management.   ? ?No change in medical history since last visit.  Patient's pain is at baseline.  Patient continues multimodal pain regimen as prescribed.  States that it provides pain relief and improvement in functional status. ?Patient is on melatonin for sleep aid at 3 mg.  She does not find much benefit with this.  Recommend dose increase to 6 mg which she can obtain over-the-counter. ? ? ?Pharmacotherapy Assessment  ?Analgesic: Hydrocodone 5 mg daily as needed, quantity 30/month; MME 5   ? ?Monitoring: ? PMP: PDMP reviewed during this encounter.       ?Pharmacotherapy: No side-effects or adverse reactions reported. ?Compliance: No problems identified. ?Effectiveness: Clinically acceptable.  UDS:  ?Summary  ?Date Value Ref Range Status  ?01/26/2021 Note  Final  ?  Comment:  ?  ==================================================================== ?[employer] Select 13 (MW) ?==================================================================== ?Test                             Result       Flag       Units ? ?Drug Present and Declared for Prescription Verification ?  Hydrocodone                    368          EXPECTED   ng/mg creat ?  Norhydrocodone                 823          EXPECTED   ng/mg creat ?   Sources of hydrocodone include scheduled prescription medications. ?   Norhydrocodone is an expected metabolite of hydrocodone. ? ?==================================================================== ?Test  Result    Flag   Units      Ref Range ?  Creatinine              31               mg/dL      >=20 ?==================================================================== ?Declared Medications: ? The flagging and interpretation on this report are based on the ? following declared medications.  Unexpected results may arise from ? inaccuracies in the declared medications. ? ? **Note: The testing scope of this panel includes these medications: ? ? Hydrocodone (Norco) ? ?  **Note: The testing scope of this panel does not include the ? following reported medications: ? ? Acetaminophen ? Acetaminophen (Norco) ? Calcium ? Cholecalciferol ? Cyanocobalamin ? Fluticasone (Flonase) ? Hydrochlorothiazide (Hydrodiuril) ? Losartan (Cozaar) ? Meclizine (Antivert) ? Pantoprazole (Protonix) ? Potassium (Klor-Con) ? Sodium Chloride ? Topical ? Triamcinolone (Kenalog) ? Vitamin D ?==================================================================== ?For clinical consultation, please call [phone]. ?==================================================================== ?  ?  ? ? ?ROS  ?Constitutional: Denies any fever or chills ?Gastrointestinal: No reported hemesis, hematochezia, vomiting, or acute GI distress ?Musculoskeletal: Denies any acute onset joint swelling, redness, loss of ROM, or weakness ?Neurological: No reported episodes of acute onset apraxia, aphasia, dysarthria, agnosia, amnesia, paralysis, loss of coordination, or loss of consciousness ? ?Medication Review  ?Acetaminophen, Arnicare, Caltrate 600+D Plus Minerals, Cholecalciferol, HYDROcodone-acetaminophen, Tetrahydrozoline HCl, fluticasone, gentamicin cream, hydrochlorothiazide, losartan, meclizine, melatonin, metoprolol tartrate, pantoprazole, potassium chloride, sodium chloride, triamcinolone ointment, and vitamin B-12 ? ?History Review  ?Allergy: Ms. Connett is allergic to tramadol and nsaids. ?Drug: Ms. Austin  reports no history of drug use. ?Alcohol:  reports no history of alcohol use. ?Tobacco:  reports that she has never smoked. She has never used smokeless tobacco. ?Social: Ms. Abid  reports that she has never smoked. She has never used smokeless tobacco. She reports that she does not drink alcohol and does not use drugs. ?Medical:  has a past medical history of Bile duct stone (02/2016), Hypertension, and PONV (postoperative nausea and vomiting). ?Surgical: Ms. Bellows  has a past surgical history that includes  Cholecystectomy; ERCP (N/A, 03/11/2016); Breast biopsy; ERCP (N/A, 06/18/2016); Gastrointestinal stent removal (N/A, 06/18/2016); Spyglass cholangioscopy (N/A, 06/18/2016); and spyglass lithotripsy (N/A, 06/18/2016). ?Family: family history is not on file. ? ?Laboratory Chemistry Profile  ? ?Renal ?Lab Results  ?Component Value Date  ? BUN 14 11/07/2020  ? CREATININE 0.76 11/07/2020  ? GFRAA 57 (L) 09/02/2019  ? GFRNONAA >60 11/07/2020  ? ?  Hepatic ?Lab Results  ?Component Value Date  ? AST 22 11/07/2020  ? ALT 15 11/07/2020  ? ALBUMIN 4.1 11/07/2020  ? ALKPHOS 53 11/07/2020  ? LIPASE 32 11/07/2020  ? ?  ?Electrolytes ?Lab Results  ?Component Value Date  ? NA 137 11/07/2020  ? K 3.8 11/07/2020  ? CL 104 11/07/2020  ? CALCIUM 9.3 11/07/2020  ? ?  Bone ?No results found for: [place], [id number], [id number], [id number], 25OHVITD1, 25OHVITD2, 25OHVITD3, TESTOFREE, TESTOSTERONE ?  ?Inflammation (CRP: Acute Phase) (ESR: Chronic Phase) ?Lab Results  ?Component Value Date  ? LATICACIDVEN 1.4 09/02/2019  ? ?    ?Note: Above Lab results reviewed. ? ?Recent Imaging Review  ?CT Head Wo Contrast ?CLINICAL DATA:  88 year old female with dizziness for 4 days. ? ?EXAM: ?CT HEAD WITHOUT CONTRAST ? ?TECHNIQUE: ?Contiguous axial images were obtained from the base of the skull ?through the vertex without intravenous contrast. ? ?COMPARISON:  Brain MRI 09/26/2016.  Head CT 08/23/2018. ? ?FINDINGS: ?Brain:  Cerebral volume is not significantly changed since 2020. No ?midline shift, ventriculomegaly, mass effect, evidence of mass ?lesion, intracranial hemorrhage or evidence of cortically based ?acute infarction. Gray-white matter differentiation is stable and ?within normal limits for age. No cortical encephalomalacia ?identified. ? ?Vascular: Calcified atherosclerosis at the skull base. No suspicious ?intracranial vascular hyperdensity. ? ?Skull: No acute osseous abnormality identified. ? ?Sinuses/Orbits: Visualized paranasal sinuses and  mastoids are stable ?and well aerated. Tympanic cavities remain clear. ? ?Other: No acute orbit or scalp soft tissue finding. ? ?IMPRESSION: ?Stable and normal for age non contrast CT appearance of the brain. ? ?Ele

## 2021-07-27 NOTE — Patient Instructions (Signed)
Consider increasing Melatonin to 6 mg  ? ?

## 2021-09-28 ENCOUNTER — Telehealth: Payer: Self-pay | Admitting: Student in an Organized Health Care Education/Training Program

## 2021-09-28 NOTE — Telephone Encounter (Signed)
Patient stated she is out of meds. Please give patient a call. Thanks

## 2021-09-28 NOTE — Telephone Encounter (Signed)
Pharmacy notified that they could deliver on Friday since they do not deliver on Saturday and are closed on Friday.  Informed patient not to start taking the meds until the date they are due to start.

## 2021-10-26 ENCOUNTER — Encounter: Payer: Self-pay | Admitting: Student in an Organized Health Care Education/Training Program

## 2021-10-26 ENCOUNTER — Ambulatory Visit
Payer: Medicare Other | Attending: Student in an Organized Health Care Education/Training Program | Admitting: Student in an Organized Health Care Education/Training Program

## 2021-10-26 VITALS — BP 163/90 | HR 69 | Temp 97.4°F | Resp 16 | Ht <= 58 in | Wt 86.0 lb

## 2021-10-26 DIAGNOSIS — M1712 Unilateral primary osteoarthritis, left knee: Secondary | ICD-10-CM | POA: Insufficient documentation

## 2021-10-26 DIAGNOSIS — Z79891 Long term (current) use of opiate analgesic: Secondary | ICD-10-CM | POA: Diagnosis not present

## 2021-10-26 DIAGNOSIS — G894 Chronic pain syndrome: Secondary | ICD-10-CM | POA: Diagnosis present

## 2021-10-26 DIAGNOSIS — M5030 Other cervical disc degeneration, unspecified cervical region: Secondary | ICD-10-CM

## 2021-10-26 DIAGNOSIS — M19012 Primary osteoarthritis, left shoulder: Secondary | ICD-10-CM | POA: Diagnosis present

## 2021-10-26 DIAGNOSIS — M5136 Other intervertebral disc degeneration, lumbar region: Secondary | ICD-10-CM | POA: Diagnosis not present

## 2021-10-26 DIAGNOSIS — M19011 Primary osteoarthritis, right shoulder: Secondary | ICD-10-CM | POA: Insufficient documentation

## 2021-10-26 DIAGNOSIS — S22009S Unspecified fracture of unspecified thoracic vertebra, sequela: Secondary | ICD-10-CM | POA: Diagnosis not present

## 2021-10-26 MED ORDER — HYDROCODONE-ACETAMINOPHEN 5-325 MG PO TABS: 1.0000 | ORAL_TABLET | Freq: Every day | ORAL | 0 refills | Status: DC | PRN

## 2021-10-26 NOTE — Progress Notes (Signed)
Nursing Pain Medication Assessment:  Safety precautions to be maintained throughout the outpatient stay will include: orient to surroundings, keep bed in low position, maintain call bell within reach at all times, provide assistance with transfer out of bed and ambulation.  Medication Inspection Compliance: Pill count conducted under aseptic conditions, in front of the patient. Neither the pills nor the bottle was removed from the patient's sight at any time. Once count was completed pills were immediately returned to the patient in their original bottle.  Medication: Hydrocodone/APAP Pill/Patch Count:  5 of 30 pills remain Pill/Patch Appearance: Markings consistent with prescribed medication Bottle Appearance: Standard pharmacy container. Clearly labeled. Filled Date: 6 / 90 / 2023 Last Medication intake:  Today

## 2021-10-26 NOTE — Progress Notes (Signed)
PROVIDER NOTE: Information contained herein reflects review and annotations entered in association with encounter. Interpretation of such information and data should be left to medically-trained personnel. Information provided to patient can be located elsewhere in the medical record under "Patient Instructions". Document created using STT-dictation technology, any transcriptional errors that may result from process are unintentional.    Patient: Susan Blackwell  Service Category: E/M  Provider: Gillis Santa, MD  DOB: 02-Jan-1920  DOS: 10/26/2021  Specialty: Interventional Pain Management  MRN: [id number]  Setting: Ambulatory outpatient  PCP: Kirk Ruths, MD  Type: Established Patient    Referring Provider: Kirk Ruths, MD  Location: Office  Delivery: Face-to-face     HPI  Ms. Susan Blackwell, a 88 y.o. year old female, is here today because of her Encounter for long-term opiate analgesic use [Z79.891]. Ms. Susan Blackwell primary complain today is Joint Pain (Osteoarthritis, entire body)   Pertinent problems: Ms. Susan Blackwell has Chronic pain syndrome; Closed fracture of transverse process of thoracic vertebra ([place]); DDD (degenerative disc disease), cervical; Lumbar degenerative disc disease; Primary osteoarthritis of both shoulders; and Encounter for long-term opiate analgesic use on their pertinent problem list. Pain Assessment: Severity of   is reported as a 8 /10. Location: Hip (Joint pain) Other (Comment)/entire body, mostly in hips bilateral and shoulders bilateral. Onset: More than a month ago. Quality: Aching, Constant, Sharp, Discomfort. Timing: Constant. Modifying factor(s): med "Nothing really helps me". Vitals:  height is 4' 8" (1.422 m) and weight is 86 lb (39 kg). Her temperature is 97.4 F (36.3 C) (abnormal). Her blood pressure is 163/90 (abnormal) and her pulse is 69. Her respiration is 16 and oxygen saturation is 99%.   Reason for encounter: medication management.    No change in  medical history since last visit.  Patient's pain is at baseline.  Patient continues multimodal pain regimen as prescribed.  States that it provides pain relief and improvement in functional status.   Pharmacotherapy Assessment  Analgesic: Hydrocodone 5 mg daily as needed, quantity 30/month; MME 5    Monitoring: [place] PMP: PDMP reviewed during this encounter.       Pharmacotherapy: No side-effects or adverse reactions reported. Compliance: No problems identified. Effectiveness: Clinically acceptable.  UDS:  Summary  Date Value Ref Range Status  01/26/2021 Note  Final    Comment:    ==================================================================== [employer] Select 13 (MW) ==================================================================== Test                             Result       Flag       Units  Drug Present and Declared for Prescription Verification   Hydrocodone                    368          EXPECTED   ng/mg creat   Norhydrocodone                 823          EXPECTED   ng/mg creat    Sources of hydrocodone include scheduled prescription medications.    Norhydrocodone is an expected metabolite of hydrocodone.  ==================================================================== Test                      Result    Flag   Units      Ref Range   Creatinine  31               mg/dL      >=20 ==================================================================== Declared Medications:  The flagging and interpretation on this report are based on the  following declared medications.  Unexpected results may arise from  inaccuracies in the declared medications.   **Note: The testing scope of this panel includes these medications:   Hydrocodone (Norco)   **Note: The testing scope of this panel does not include the  following reported medications:   Acetaminophen  Acetaminophen (Norco)  Calcium  Cholecalciferol  Cyanocobalamin  Fluticasone (Flonase)   Hydrochlorothiazide (Hydrodiuril)  Losartan (Cozaar)  Meclizine (Antivert)  Pantoprazole (Protonix)  Potassium (Klor-Con)  Sodium Chloride  Topical  Triamcinolone (Kenalog)  Vitamin D ==================================================================== For clinical consultation, please call [phone]. ====================================================================       ROS  Constitutional: Denies any fever or chills Gastrointestinal: No reported hemesis, hematochezia, vomiting, or acute GI distress Musculoskeletal: Denies any acute onset joint swelling, redness, loss of ROM, or weakness Neurological: No reported episodes of acute onset apraxia, aphasia, dysarthria, agnosia, amnesia, paralysis, loss of coordination, or loss of consciousness  Medication Review  Acetaminophen, Arnicare, Caltrate 600+D Plus Minerals, Cholecalciferol, HYDROcodone-acetaminophen, Tetrahydrozoline HCl, fluticasone, gentamicin cream, hydrochlorothiazide, losartan, meclizine, melatonin, metoprolol tartrate, pantoprazole, potassium chloride, sodium chloride, triamcinolone ointment, and vitamin B-12  History Review  Allergy: Ms. Susan Blackwell is allergic to tramadol and nsaids. Drug: Ms. Susan Blackwell  reports no history of drug use. Alcohol:  reports no history of alcohol use. Tobacco:  reports that she has never smoked. She has never used smokeless tobacco. Social: Ms. Susan Blackwell  reports that she has never smoked. She has never used smokeless tobacco. She reports that she does not drink alcohol and does not use drugs. Medical:  has a past medical history of Bile duct stone (02/2016), Hypertension, and PONV (postoperative nausea and vomiting). Surgical: Ms. Susan Blackwell  has a past surgical history that includes Cholecystectomy; ERCP (N/A, 03/11/2016); Breast biopsy; ERCP (N/A, 06/18/2016); Gastrointestinal stent removal (N/A, 06/18/2016); Spyglass cholangioscopy (N/A, 06/18/2016); and spyglass lithotripsy (N/A,  06/18/2016). Family: family history is not on file.  Laboratory Chemistry Profile   Renal Lab Results  Component Value Date   BUN 14 11/07/2020   CREATININE 0.76 11/07/2020   GFRAA 57 (L) 09/02/2019   GFRNONAA >60 11/07/2020     Hepatic Lab Results  Component Value Date   AST 22 11/07/2020   ALT 15 11/07/2020   ALBUMIN 4.1 11/07/2020   ALKPHOS 53 11/07/2020   LIPASE 32 11/07/2020     Electrolytes Lab Results  Component Value Date   NA 137 11/07/2020   K 3.8 11/07/2020   CL 104 11/07/2020   CALCIUM 9.3 11/07/2020     Bone No results found for: "VD25OH", "VD125OH2TOT", "TI4580DX8", "PJ8250NL9", "25OHVITD1", "25OHVITD2", "25OHVITD3", "TESTOFREE", "TESTOSTERONE"   Inflammation (CRP: Acute Phase) (ESR: Chronic Phase) Lab Results  Component Value Date   LATICACIDVEN 1.4 09/02/2019       Note: Above Lab results reviewed.  Recent Imaging Review  CT Head Wo Contrast CLINICAL DATA:  88 year old female with dizziness for 4 days.  EXAM: CT HEAD WITHOUT CONTRAST  TECHNIQUE: Contiguous axial images were obtained from the base of the skull through the vertex without intravenous contrast.  COMPARISON:  Brain MRI 09/26/2016.  Head CT 08/23/2018.  FINDINGS: Brain: Cerebral volume is not significantly changed since 2020. No midline shift, ventriculomegaly, mass effect, evidence of mass lesion, intracranial hemorrhage or evidence of cortically based acute infarction. Gray-white matter differentiation is  stable and within normal limits for age. No cortical encephalomalacia identified.  Vascular: Calcified atherosclerosis at the skull base. No suspicious intracranial vascular hyperdensity.  Skull: No acute osseous abnormality identified.  Sinuses/Orbits: Visualized paranasal sinuses and mastoids are stable and well aerated. Tympanic cavities remain clear.  Other: No acute orbit or scalp soft tissue finding.  IMPRESSION: Stable and normal for age non contrast CT  appearance of the brain.  Electronically Signed   By: Genevie Ann M.D.   On: 11/07/2020 11:22  Note: Reviewed        Physical Exam  General appearance: Well nourished, well developed, and well hydrated. In no apparent acute distress Mental status: Alert, oriented x 3 (person, place, & time)       Respiratory: No evidence of acute respiratory distress Eyes: PERLA Vitals: BP (!) 163/90   Pulse 69   Temp (!) 97.4 F (36.3 C)   Resp 16   Ht 4' 8" (1.422 m)   Wt 86 lb (39 kg)   SpO2 99%   BMI 19.28 kg/m  BMI: Estimated body mass index is 19.28 kg/m as calculated from the following:   Height as of this encounter: 4' 8" (1.422 m).   Weight as of this encounter: 86 lb (39 kg). Ideal: Female patients must weigh at least 45.5 kg to calculate ideal body weight    Lumbar Spine Area Exam  Skin & Axial Inspection: Thoraco-lumbar Scoliosis Alignment: Scoliosis detected Functional ROM: Decreased ROM       Stability: No instability detected Muscle Tone/Strength: Functionally intact. No obvious neuro-muscular anomalies detected. Sensory (Neurological): Musculoskeletal pain pattern Palpation: Complains of area being tender to palpation         Gait & Posture Assessment  Ambulation: Patient came in today in a wheel chair Gait: Significantly limited. Dependent on assistive device to ambulate Posture: Difficulty standing up straight, due to pain    Lower Extremity Exam      Side: Right lower extremity   Side: Left lower extremity  Stability: No instability observed           Stability: No instability observed          Skin & Extremity Inspection: Skin color, temperature, and hair growth are WNL. No peripheral edema or cyanosis. No masses, redness, swelling, asymmetry, or associated skin lesions. No contractures.   Skin & Extremity Inspection: Skin color, temperature, and hair growth are WNL. No peripheral edema or cyanosis. No masses, redness, swelling, asymmetry, or associated skin lesions. No  contractures.  Functional ROM: Pain restricted ROM for hip and knee joints           Functional ROM: Pain restricted ROM for hip and knee joints          Muscle Tone/Strength: Functionally intact. No obvious neuro-muscular anomalies detected.   Muscle Tone/Strength: Functionally intact. No obvious neuro-muscular anomalies detected.  Sensory (Neurological): Arthropathic arthralgia         Sensory (Neurological): Arthropathic arthralgia           Assessment   Status Diagnosis  Controlled Controlled Controlled 1. Encounter for long-term opiate analgesic use   2. Lumbar degenerative disc disease   3. Primary osteoarthritis of both shoulders   4. Closed fracture of transverse process of thoracic vertebra, sequela   5. Primary osteoarthritis of left knee   6. DDD (degenerative disc disease), cervical   7. Chronic pain syndrome          Plan of Care   Ms. Susan Server  Blackwell has a current medication list which includes the following long-term medication(s): caltrate 600+d plus minerals, losartan, metoprolol tartrate, pantoprazole, potassium chloride, fluticasone, hydrocodone-acetaminophen, [START ON 10/27/2021] hydrocodone-acetaminophen, [START ON 11/27/2021] hydrocodone-acetaminophen, [START ON 12/27/2021] hydrocodone-acetaminophen, and sodium chloride.  Pharmacotherapy (Medications Ordered): Meds ordered this encounter  Medications   HYDROcodone-acetaminophen (NORCO/VICODIN) 5-325 MG tablet    Sig: Take 1 tablet by mouth daily as needed for moderate pain. For chronic pain syndrome    Dispense:  30 tablet    Refill:  0   HYDROcodone-acetaminophen (NORCO/VICODIN) 5-325 MG tablet    Sig: Take 1 tablet by mouth daily as needed for moderate pain. For chronic pain syndrome    Dispense:  30 tablet    Refill:  0   HYDROcodone-acetaminophen (NORCO/VICODIN) 5-325 MG tablet    Sig: Take 1 tablet by mouth daily as needed for moderate pain. For chronic pain syndrome    Dispense:  30 tablet    Refill:   0    Follow-up plan:   Return in about 3 months (around 01/26/2022) for Medication Management, in person.   Recent Visits No visits were found meeting these conditions. Showing recent visits within past 90 days and meeting all other requirements Today's Visits Date Type Provider Dept  10/26/21 Office Visit Lateef, Bilal, MD [facility]  Showing today's visits and meeting all other requirements Future Appointments No visits were found meeting these conditions. Showing future appointments within next 90 days and meeting all other requirements  I discussed the assessment and treatment plan with the patient. The patient was provided an opportunity to ask questions and all were answered. The patient agreed with the plan and demonstrated an understanding of the instructions.  Patient advised to call back or seek an in-person evaluation if the symptoms or condition worsens.  Duration of encounter:30minutes.  Note by: Bilal Lateef, MD Date: 10/26/2021; Time: 4:17 PM 

## 2022-01-01 ENCOUNTER — Other Ambulatory Visit: Payer: Self-pay

## 2022-01-01 ENCOUNTER — Encounter: Payer: Self-pay | Admitting: Emergency Medicine

## 2022-01-01 ENCOUNTER — Emergency Department: Payer: Medicare Other

## 2022-01-01 ENCOUNTER — Observation Stay
Admission: EM | Admit: 2022-01-01 | Discharge: 2022-01-04 | Disposition: A | Discharge status: Home Health | Payer: Medicare Other | Attending: Obstetrics and Gynecology | Admitting: Obstetrics and Gynecology

## 2022-01-01 DIAGNOSIS — R531 Weakness: Secondary | ICD-10-CM | POA: Diagnosis not present

## 2022-01-01 DIAGNOSIS — G894 Chronic pain syndrome: Secondary | ICD-10-CM | POA: Diagnosis present

## 2022-01-01 DIAGNOSIS — Z20822 Contact with and (suspected) exposure to covid-19: Secondary | ICD-10-CM | POA: Diagnosis not present

## 2022-01-01 DIAGNOSIS — G2581 Restless legs syndrome: Secondary | ICD-10-CM | POA: Diagnosis present

## 2022-01-01 DIAGNOSIS — B961 Klebsiella pneumoniae [K. pneumoniae] as the cause of diseases classified elsewhere: Secondary | ICD-10-CM | POA: Insufficient documentation

## 2022-01-01 DIAGNOSIS — D539 Nutritional anemia, unspecified: Secondary | ICD-10-CM | POA: Diagnosis present

## 2022-01-01 DIAGNOSIS — M19012 Primary osteoarthritis, left shoulder: Secondary | ICD-10-CM | POA: Diagnosis present

## 2022-01-01 DIAGNOSIS — N390 Urinary tract infection, site not specified: Secondary | ICD-10-CM | POA: Diagnosis present

## 2022-01-01 DIAGNOSIS — D649 Anemia, unspecified: Secondary | ICD-10-CM | POA: Diagnosis present

## 2022-01-01 DIAGNOSIS — R296 Repeated falls: Secondary | ICD-10-CM | POA: Diagnosis not present

## 2022-01-01 DIAGNOSIS — R011 Cardiac murmur, unspecified: Secondary | ICD-10-CM | POA: Diagnosis not present

## 2022-01-01 DIAGNOSIS — Z23 Encounter for immunization: Secondary | ICD-10-CM | POA: Insufficient documentation

## 2022-01-01 DIAGNOSIS — R42 Dizziness and giddiness: Secondary | ICD-10-CM

## 2022-01-01 DIAGNOSIS — M19011 Primary osteoarthritis, right shoulder: Secondary | ICD-10-CM | POA: Diagnosis present

## 2022-01-01 DIAGNOSIS — I10 Essential (primary) hypertension: Secondary | ICD-10-CM | POA: Diagnosis not present

## 2022-01-01 DIAGNOSIS — M5136 Other intervertebral disc degeneration, lumbar region: Secondary | ICD-10-CM | POA: Diagnosis present

## 2022-01-01 DIAGNOSIS — M51369 Other intervertebral disc degeneration, lumbar region without mention of lumbar back pain or lower extremity pain: Secondary | ICD-10-CM | POA: Diagnosis present

## 2022-01-01 DIAGNOSIS — Z79899 Other long term (current) drug therapy: Secondary | ICD-10-CM | POA: Diagnosis not present

## 2022-01-01 DIAGNOSIS — M5030 Other cervical disc degeneration, unspecified cervical region: Secondary | ICD-10-CM | POA: Diagnosis present

## 2022-01-01 LAB — CBC WITH DIFFERENTIAL/PLATELET
Abs Immature Granulocytes: 0.03 10*3/uL (ref 0.00–0.07)
Basophils Absolute: 0 10*3/uL (ref 0.0–0.1)
Basophils Relative: 0 %
Eosinophils Absolute: 0 10*3/uL (ref 0.0–0.5)
Eosinophils Relative: 1 %
HCT: 25.9 % — ABNORMAL LOW (ref 36.0–46.0)
Hemoglobin: 8.5 g/dL — ABNORMAL LOW (ref 12.0–15.0)
Immature Granulocytes: 1 %
Lymphocytes Relative: 12 %
Lymphs Abs: 0.5 10*3/uL — ABNORMAL LOW (ref 0.7–4.0)
MCH: 36.2 pg — ABNORMAL HIGH (ref 26.0–34.0)
MCHC: 32.8 g/dL (ref 30.0–36.0)
MCV: 110.2 fL — ABNORMAL HIGH (ref 80.0–100.0)
Monocytes Absolute: 0.5 10*3/uL (ref 0.1–1.0)
Monocytes Relative: 12 %
Neutro Abs: 3.2 10*3/uL (ref 1.7–7.7)
Neutrophils Relative %: 74 %
Platelets: 125 10*3/uL — ABNORMAL LOW (ref 150–400)
RBC: 2.35 MIL/uL — ABNORMAL LOW (ref 3.87–5.11)
RDW: 16 % — ABNORMAL HIGH (ref 11.5–15.5)
Smear Review: NORMAL
WBC: 4.4 10*3/uL (ref 4.0–10.5)
nRBC: 0 % (ref 0.0–0.2)

## 2022-01-01 LAB — URINALYSIS, ROUTINE W REFLEX MICROSCOPIC
Bilirubin Urine: NEGATIVE
Glucose, UA: NEGATIVE mg/dL
Ketones, ur: NEGATIVE mg/dL
Nitrite: NEGATIVE
Protein, ur: NEGATIVE mg/dL
Specific Gravity, Urine: 1.009 (ref 1.005–1.030)
pH: 5 (ref 5.0–8.0)

## 2022-01-01 LAB — COMPREHENSIVE METABOLIC PANEL
ALT: 15 U/L (ref 0–44)
AST: 24 U/L (ref 15–41)
Albumin: 3.9 g/dL (ref 3.5–5.0)
Alkaline Phosphatase: 59 U/L (ref 38–126)
Anion gap: 7 (ref 5–15)
BUN: 20 mg/dL (ref 8–23)
CO2: 27 mmol/L (ref 22–32)
Calcium: 9.3 mg/dL (ref 8.9–10.3)
Chloride: 107 mmol/L (ref 98–111)
Creatinine, Ser: 1.02 mg/dL — ABNORMAL HIGH (ref 0.44–1.00)
GFR, Estimated: 49 mL/min — ABNORMAL LOW (ref >60)
Glucose, Bld: 115 mg/dL — ABNORMAL HIGH (ref 70–99)
Potassium: 3.9 mmol/L (ref 3.5–5.1)
Sodium: 141 mmol/L (ref 135–145)
Total Bilirubin: 1 mg/dL (ref 0.3–1.2)
Total Protein: 6.1 g/dL — ABNORMAL LOW (ref 6.5–8.1)

## 2022-01-01 LAB — PROCALCITONIN: Procalcitonin: <0.1 ng/mL

## 2022-01-01 LAB — SARS CORONAVIRUS 2 BY RT PCR: SARS Coronavirus 2 by RT PCR: NEGATIVE

## 2022-01-01 LAB — PHOSPHORUS: Phosphorus: 4 mg/dL (ref 2.5–4.6)

## 2022-01-01 LAB — FOLATE: Folate: 21.9 ng/mL (ref >5.9)

## 2022-01-01 LAB — TROPONIN I (HIGH SENSITIVITY): Troponin I (High Sensitivity): 12 ng/L (ref <18)

## 2022-01-01 LAB — MAGNESIUM: Magnesium: 1.5 mg/dL — ABNORMAL LOW (ref 1.7–2.4)

## 2022-01-01 MED ORDER — ENOXAPARIN SODIUM 30 MG/0.3ML IJ SOSY
30.0000 mg | PREFILLED_SYRINGE | Freq: Every day | INTRAMUSCULAR | Status: DC
  Administered 2022-01-01 – 2022-01-03 (×3): 30 mg via SUBCUTANEOUS
  Filled 2022-01-01 (×3): qty 0.3

## 2022-01-01 MED ORDER — SODIUM CHLORIDE 0.9 % IV BOLUS
1000.0000 mL | Freq: Once | INTRAVENOUS | Status: AC
  Administered 2022-01-01: 1000 mL via INTRAVENOUS

## 2022-01-01 MED ORDER — ACETAMINOPHEN 325 MG PO TABS: 650.0000 mg | ORAL_TABLET | Freq: Four times a day (QID) | ORAL | Status: DC | PRN

## 2022-01-01 MED ORDER — PANTOPRAZOLE SODIUM 40 MG PO TBEC
40.0000 mg | DELAYED_RELEASE_TABLET | Freq: Every day | ORAL | Status: DC
  Administered 2022-01-01 – 2022-01-04 (×4): 40 mg via ORAL
  Filled 2022-01-01 (×4): qty 1

## 2022-01-01 MED ORDER — MAGNESIUM SULFATE IN D5W 1-5 GM/100ML-% IV SOLN
1.0000 g | Freq: Once | INTRAVENOUS | Status: AC
  Administered 2022-01-02: 1 g via INTRAVENOUS
  Filled 2022-01-01 (×2): qty 100

## 2022-01-01 MED ORDER — METOPROLOL TARTRATE 50 MG PO TABS: 50.0000 mg | ORAL_TABLET | Freq: Two times a day (BID) | ORAL | Status: DC

## 2022-01-01 MED ORDER — SODIUM CHLORIDE 0.9 % IV SOLN
1.0000 g | INTRAVENOUS | Status: DC
  Administered 2022-01-02 – 2022-01-03 (×2): 1 g via INTRAVENOUS
  Filled 2022-01-01 (×2): qty 10

## 2022-01-01 MED ORDER — ONDANSETRON HCL 4 MG/2ML IJ SOLN
4.0000 mg | Freq: Four times a day (QID) | INTRAMUSCULAR | Status: DC | PRN
  Administered 2022-01-01: 4 mg via INTRAVENOUS
  Filled 2022-01-01: qty 2

## 2022-01-01 MED ORDER — LABETALOL HCL 5 MG/ML IV SOLN: 5.0000 mg | INTRAVENOUS | Status: DC | PRN

## 2022-01-01 MED ORDER — ACETAMINOPHEN 650 MG RE SUPP: 650.0000 mg | Freq: Four times a day (QID) | RECTAL | Status: DC | PRN

## 2022-01-01 MED ORDER — SENNOSIDES-DOCUSATE SODIUM 8.6-50 MG PO TABS: 1.0000 | ORAL_TABLET | Freq: Every evening | ORAL | Status: DC | PRN

## 2022-01-01 MED ORDER — ROPINIROLE HCL 0.25 MG PO TABS
0.2500 mg | ORAL_TABLET | Freq: Once | ORAL | Status: AC
  Administered 2022-01-01: 0.25 mg via ORAL
  Filled 2022-01-01: qty 1

## 2022-01-01 MED ORDER — HYDROCODONE-ACETAMINOPHEN 5-325 MG PO TABS
1.0000 | ORAL_TABLET | Freq: Every day | ORAL | Status: DC | PRN
  Administered 2022-01-02: 1 via ORAL
  Filled 2022-01-01: qty 1

## 2022-01-01 MED ORDER — ONDANSETRON HCL 4 MG PO TABS: 4.0000 mg | ORAL_TABLET | Freq: Four times a day (QID) | ORAL | Status: DC | PRN

## 2022-01-01 NOTE — ED Triage Notes (Signed)
Patient to ED for dizziness. Patient states she has not been eating or drinking much for the past few days. She also has been having episodes of incontinence over the past few weeks. Patient took 650 mg tylenol PTA.

## 2022-01-01 NOTE — ED Notes (Signed)
Purewick placed at this time.

## 2022-01-01 NOTE — ED Notes (Signed)
Ms. Talbert Forest [phone]

## 2022-01-01 NOTE — Assessment & Plan Note (Signed)
-   She endorses her legs being restless for as long as she can remember and it is worse when she feels cold. - At bedside her legs are frequently moving and she appears to be uncomfortable, she states this is her baseline when her legs get cold - Ropinirole (Requip) low-dose initiated on admission, 0.25 mg p.o. once - Can consider increasing dose as appropriate

## 2022-01-01 NOTE — H&P (Addendum)
History and Physical   Susan Blackwell ZOX:[id number] DOB: [birth year] DOA: 01/01/2022  PCP: Lauro Regulus, MD  Outpatient Specialists: Dr. Edward Jolly, pain management Patient coming from: Home  I have personally briefly reviewed patient's old medical records in [facility] EMR.  Chief Concern: Dizziness and weakness  HPI: Ms. Susan Blackwell is a 88 year old female with history of hypertension, insomnia, chronic pain syndrome, lumbar degenerative disease, osteoporosis, generalized osteoarthritis, macrocytic anemia presumed of chronic disease, who presents emergency department for chief concerns of weakness and frequent falls.  Initial vitals in the emergency department showed temperature of 98.3, respiration rate of 18, heart rate of 66, blood pressure 196/61, SPO2 99% on room air.  Serum sodium is 141, potassium 3.9, chloride 107, bicarb 27, BUN of 20, serum creatinine of 1.02, GFR of 49, nonfasting blood glucose 115, WBC 4.4, hemoglobin 8.5, platelets of 125.  High sensitive troponin was 12. COVID/influenza A/influenza B PCR were negative.  UA has been ordered and pending collection.  CT of the head without contrast: Was read as no acute intracranial abnormality.  ED treatment: None  At bedside, she is able to tell me her name, age, current year.  At baseline, she reports she puts her own cloths on, does her cooking, and does the dishes. Her son does the grocery shopping and laundry. Her niece lives down the street.  Her son visits her once per week.  She reports poor p.o. intake over the last week. She reports last Friday, she vomited all night. She vomited orange sherbet and foam.  She denies bright red blood and coffee-ground emesis.  She denies diarrhea.  She endorses increase falling and dizziness. She reports falling on Friday.  She reports she has not fallen since.  She denies trauma to her person, known fever, chills, cough, chest pain, shortness of breath, abdominal  pain, diarrhea.  She endorses decreased urine output for the past several days.  Social history: She lives at home by herself.  Her niece lives down the street from her.  Her son visits her weekly.  She denies history of tobacco, EtOH, recreational drug use.  She is retired and formerly worked as a [employer] Services operational officer.  ROS: Constitutional: no weight change, no fever ENT/Mouth: no sore throat, no rhinorrhea Eyes: no eye pain, no vision changes Cardiovascular: no chest pain, no dyspnea,  no edema, no palpitations Respiratory: no cough, no sputum, no wheezing Gastrointestinal: + nausea, + vomiting, no diarrhea, no constipation Genitourinary: no urinary incontinence, no dysuria, no hematuria Musculoskeletal: no arthralgias, no myalgias Skin: no skin lesions, no pruritus, Neuro: + weakness, no loss of consciousness, no syncope Psych: no anxiety, no depression, + decrease appetite Heme/Lymph: no bruising, no bleeding  ED Course: Discussed with emergency medicine provider, patient requiring hospitalization for chief concerns of frequent falls.  Assessment/Plan  Principal Problem:   Frequent falls Active Problems:   HTN (hypertension)   Anemia   Chronic pain syndrome   DDD (degenerative disc disease), cervical   Lumbar degenerative disc disease   Primary osteoarthritis of both shoulders   Restless legs   UTI (urinary tract infection)   Assessment and Plan:  * Frequent falls - Differentials include UTI in setting of poor urine output versus pneumonia - Fall precautions, PT ordered - Check procalcitonin - Query polypharmacy - Check B12, folate, methylmalonic acid per EDP - MRI not ordered on admission due to pending urine analysis, procalcitonin, B9 and B12 levels - Admit to MedSurg, observation, no telemetry  UTI (urinary tract  infection) - Decreased urine output - POA - Ceftriaxone 1 g IV daily, 5 doses ordered  Restless legs - She endorses her legs being restless for as long as she  can remember and it is worse when she feels cold. - At bedside her legs are frequently moving and she appears to be uncomfortable, she states this is her baseline when her legs get cold - Ropinirole (Requip) low-dose initiated on admission, 0.25 mg p.o. once - Can consider increasing dose as appropriate  Chronic pain syndrome - Hydrocodone-acetaminophen 5-325 mg p.o. daily as needed for moderate pain resumed  HTN (hypertension) - Patient takes hydrochlorothiazide 25 mg daily, metoprolol 50 mg p.o. twice daily, losartan 100 mg daily - Labetalol 5 mg IV every 3 hours as needed for SBP greater than 185, 4 doses ordered  Chart reviewed.   DVT prophylaxis: Enoxaparin Code Status: Full code Diet: Heart healthy Family Communication: Updated niece at bedside with patient's permission Disposition Plan: Pending clinical course Consults called: PT Admission status: MedSurg, observation  Past Medical History:  Diagnosis Date   Bile duct stone 02/2016   Hypertension    PONV (postoperative nausea and vomiting)    Past Surgical History:  Procedure Laterality Date   BREAST BIOPSY     CHOLECYSTECTOMY     ERCP N/A 03/11/2016   Procedure: ENDOSCOPIC RETROGRADE CHOLANGIOPANCREATOGRAPHY (ERCP);  Surgeon: Vida Rigger, MD;  Location: [facility] ENDOSCOPY;  Service: Endoscopy;  Laterality: N/A;   ERCP N/A 06/18/2016   Procedure: ENDOSCOPIC RETROGRADE CHOLANGIOPANCREATOGRAPHY (ERCP);  Surgeon: Vida Rigger, MD;  Location: [facility] ENDOSCOPY;  Service: Endoscopy;  Laterality: N/A;  moved for litho us/LH   GASTROINTESTINAL STENT REMOVAL N/A 06/18/2016   Procedure: GASTROINTESTINAL STENT REMOVAL;  Surgeon: Vida Rigger, MD;  Location: [facility] ENDOSCOPY;  Service: Endoscopy;  Laterality: N/A;   SPYGLASS CHOLANGIOSCOPY N/A 06/18/2016   Procedure: HQPRFFMB CHOLANGIOSCOPY;  Surgeon: Vida Rigger, MD;  Location: [facility] ENDOSCOPY;  Service: Endoscopy;  Laterality: N/A;   SPYGLASS LITHOTRIPSY N/A 06/18/2016   Procedure: WGYKZLDJ LITHOTRIPSY;   Surgeon: Vida Rigger, MD;  Location: [facility] ENDOSCOPY;  Service: Endoscopy;  Laterality: N/A;   Social History:  reports that she has never smoked. She has never used smokeless tobacco. She reports that she does not drink alcohol and does not use drugs.  Allergies  Allergen Reactions   Tramadol Nausea Only   Nsaids Swelling   Family History  Problem Relation Age of Onset   Hypertension Father    Colon cancer Sister    Hypertension Maternal Grandmother    Family history: Family history reviewed and not pertinent  Prior to Admission medications   Medication Sig Start Date End Date Taking? Authorizing Provider  Acetaminophen (TYLENOL ARTHRITIS PAIN PO) Take by mouth 2 (two) times daily.    [provider]  Calcium Carbonate-Vit D-Min (CALTRATE 600+D PLUS MINERALS) 600-800 MG-UNIT TABS Take 1 tablet by mouth daily.    [provider]  Cholecalciferol 2000 units TABS Take 1,000 Units by mouth daily.    [provider]  Homeopathic Products (ARNICARE) GEL Apply 1 application topically daily as needed (pain).    [provider]  hydrochlorothiazide (HYDRODIURIL) 25 MG tablet Take 25 mg by mouth daily. 03/28/16   [provider]  HYDROcodone-acetaminophen (NORCO/VICODIN) 5-325 MG tablet Take 1 tablet by mouth daily as needed for moderate pain. For chronic pain syndrome 04/01/21 05/01/21  Edward Jolly, MD  HYDROcodone-acetaminophen (NORCO/VICODIN) 5-325 MG tablet Take 1 tablet by mouth daily as needed for moderate pain. For chronic pain  syndrome 10/27/21 11/26/21  Edward Jolly, MD  HYDROcodone-acetaminophen (NORCO/VICODIN) 5-325 MG tablet Take 1 tablet by mouth daily as needed for moderate pain. For chronic pain syndrome 11/27/21 12/27/21  Edward Jolly, MD  HYDROcodone-acetaminophen (NORCO/VICODIN) 5-325 MG tablet Take 1 tablet by mouth daily as needed for moderate pain. For chronic pain syndrome 12/27/21 01/26/22  Edward Jolly, MD  losartan (COZAAR) 100 MG tablet  Take 100 mg by mouth daily.    [provider]  melatonin 3 MG TABS tablet Take 3 mg by mouth at bedtime.    [provider]  metoprolol (LOPRESSOR) 50 MG tablet Take 50 mg by mouth 2 (two) times daily.    [provider]  pantoprazole (PROTONIX) 40 MG tablet Take 40 mg by mouth daily.    [provider]  potassium chloride (K-DUR) 10 MEQ tablet Take 10 mEq by mouth daily.  01/25/16   [provider]  sodium chloride (OCEAN) 0.65 % SOLN nasal spray Place 1 spray into both nostrils as needed for congestion. Patient not taking: Reported on 05/02/2021    [provider]  Tetrahydrozoline HCl (VISINE OP) Apply 1 drop to eye daily as needed (dry eyes).    [provider]  vitamin B-12 (CYANOCOBALAMIN) 1000 MCG tablet Take 1,000 mcg by mouth daily.    [provider]   Physical Exam: Vitals:   01/01/22 1800 01/01/22 1833 01/01/22 2000 01/01/22 2030  BP:  (!) 118/96 (!) 139/53 (!) 144/73  Pulse:  80 71 77  Resp: (!) 21 20 20 20   Temp:      TempSrc:      SpO2:  96% 94% 93%  Weight:      Height:       Constitutional: appears age-appropriate, frail, NAD, calm, comfortable Eyes: PERRL, lids and conjunctivae normal ENMT: Mucous membranes are moist. Posterior pharynx clear of any exudate or lesions. Age-appropriate dentition. Hearing appropriate Neck: normal, supple, no masses, no thyromegaly Respiratory: clear to auscultation bilaterally, no wheezing, no crackles. Normal respiratory effort. No accessory muscle use.  Cardiovascular: Regular rate and rhythm, + murmurs present on auscultation. No extremity edema. 2+ pedal pulses. No carotid bruits.  Abdomen: no tenderness, no masses palpated, no hepatosplenomegaly. Bowel sounds positive.  Musculoskeletal: no clubbing / cyanosis. No joint deformity upper and lower extremities. Good ROM, no contractures, no atrophy. Normal muscle tone.  Skin: no rashes, lesions, ulcers. Small  ecchymosis at the chin area and right shoulder/upper arm, per patient secondary to her falls Neurologic: Sensation intact. Strength 5/5 in all 4.  Psychiatric: Normal judgment and insight. Alert and oriented x 3. Normal mood.   EKG: independently reviewed, showing sinus rhythm with rate of 65, QTc 452, left bundle branch block  Chest x-ray on Admission: I personally reviewed and I agree with radiologist reading as below.  DG Chest Portable 1 View  Result Date: 01/01/2022 CLINICAL DATA:  Dizziness, falls EXAM: PORTABLE CHEST 1 VIEW COMPARISON:  Aug 23, 2018, September 30, 2017 FINDINGS: The cardiomediastinal silhouette is unchanged in contour. Unchanged aortic calcified atherosclerosis. No acute pleuroparenchymal abnormality. No pleural effusion. No pneumothorax. Visualized abdomen is unremarkable. Multilevel degenerative changes of the thoracic spine and mild scoliosis. IMPRESSION: No acute cardiopulmonary abnormality. Electronically Signed   By: Jacob Moores M.D.   On: 01/01/2022 14:33   CT Head Wo Contrast  Result Date: 01/01/2022 CLINICAL DATA:  Head trauma, minor (Age >= 65y) EXAM: CT HEAD WITHOUT CONTRAST TECHNIQUE: Contiguous axial images were obtained from the base of the  skull through the vertex without intravenous contrast. RADIATION DOSE REDUCTION: This exam was performed according to the departmental dose-optimization program which includes automated exposure control, adjustment of the mA and/or kV according to patient size and/or use of iterative reconstruction technique. COMPARISON:  11/07/2020 FINDINGS: Brain: There is no acute intracranial hemorrhage, mass effect, or edema. Gray-white differentiation is preserved. There is no extra-axial fluid collection. Prominence of the ventricles and sulci reflects similar parenchymal volume loss. Patchy hypoattenuation in the supratentorial white matter is nonspecific but probably reflects similar chronic microvascular ischemic changes. Vascular:  There is atherosclerotic calcification at the skull base. Skull: Calvarium is unremarkable. Sinuses/Orbits: No acute finding. Evidence of prior sinonasal surgery mucosal thickening. Other: None. IMPRESSION: No acute intracranial abnormality. Electronically Signed   By: Guadlupe Spanish M.D.   On: 01/01/2022 14:23    Labs on Admission: I have personally reviewed following labs  CBC: Recent Labs  Lab 01/01/22 1326  WBC 4.4  NEUTROABS 3.2  HGB 8.5*  HCT 25.9*  MCV 110.2*  PLT 125*   Basic Metabolic Panel: Recent Labs  Lab 01/01/22 1326 01/01/22 1540  NA 141  --   K 3.9  --   CL 107  --   CO2 27  --   GLUCOSE 115*  --   BUN 20  --   CREATININE 1.02*  --   CALCIUM 9.3  --   MG  --  1.5*  PHOS  --  4.0   GFR: Estimated Creatinine Clearance: 17.2 mL/min (A) (by C-G formula based on SCr of 1.02 mg/dL (H)).  Liver Function Tests: Recent Labs  Lab 01/01/22 1326  AST 24  ALT 15  ALKPHOS 59  BILITOT 1.0  PROT 6.1*  ALBUMIN 3.9   Urine analysis:    Component Value Date/Time   COLORURINE YELLOW (A) 01/01/2022 1345   APPEARANCEUR HAZY (A) 01/01/2022 1345   APPEARANCEUR Clear 07/13/2013 0540   LABSPEC 1.009 01/01/2022 1345   LABSPEC 1.011 07/13/2013 0540   PHURINE 5.0 01/01/2022 1345   GLUCOSEU NEGATIVE 01/01/2022 1345   GLUCOSEU Negative 07/13/2013 0540   HGBUR SMALL (A) 01/01/2022 1345   BILIRUBINUR NEGATIVE 01/01/2022 1345   BILIRUBINUR Negative 07/13/2013 0540   KETONESUR NEGATIVE 01/01/2022 1345   PROTEINUR NEGATIVE 01/01/2022 1345   NITRITE NEGATIVE 01/01/2022 1345   LEUKOCYTESUR SMALL (A) 01/01/2022 1345   LEUKOCYTESUR Negative 07/13/2013 0540   Dr. Sedalia Muta [facility]  If 7PM-7AM, please contact overnight-coverage provider If 7AM-7PM, please contact day coverage provider [email or web address]  01/01/2022, 10:56 PM

## 2022-01-01 NOTE — Assessment & Plan Note (Signed)
-   Decreased urine output - POA - Ceftriaxone 1 g IV daily, 5 doses ordered --follow urine culture

## 2022-01-01 NOTE — Progress Notes (Signed)
PHARMACIST - PHYSICIAN COMMUNICATION  CONCERNING:  Enoxaparin (Lovenox) for DVT Prophylaxis    RECOMMENDATION: Patient was prescribed enoxaparin 40mg  q24 hours for VTE prophylaxis.   Filed Weights   01/01/22 1322  Weight: 38.6 kg (85 lb)    Body mass index is 19.06 kg/m.  Estimated Creatinine Clearance: 17.2 mL/min (A) (by C-G formula based on SCr of 1.02 mg/dL (H)).  Patient is candidate for enoxaparin 30mg  every 24 hours based on CrCl <40ml/min or Weight <45kg  DESCRIPTION: Pharmacy has adjusted enoxaparin dose per [facility] policy.  Patient is now receiving enoxaparin 30 mg every 24 hours   31m 01/01/2022 3:42 PM

## 2022-01-01 NOTE — Hospital Course (Signed)
Ms. Kaitlen Redford is a 88 year old female with history of hypertension, insomnia, chronic pain syndrome, lumbar degenerative disease, osteoporosis, generalized osteoarthritis, macrocytic anemia presumed of chronic disease, who presents emergency department for chief concerns of weakness and frequent falls.  Initial vitals in the emergency department showed temperature of 98.3, respiration rate of 18, heart rate of 66, blood pressure 196/61, SPO2 99% on room air.  Serum sodium is 141, potassium 3.9, chloride 107, bicarb 27, BUN of 20, serum creatinine of 1.02, GFR of 49, nonfasting blood glucose 115, WBC 4.4, hemoglobin 8.5, platelets of 125.  High sensitive troponin was 12. COVID/influenza A/influenza B PCR were negative.  UA has been ordered and pending collection.  CT of the head without contrast: Was read as no acute intracranial abnormality.  ED treatment: None

## 2022-01-01 NOTE — Assessment & Plan Note (Addendum)
Suspect due to dizziness (?vertigo) vs UTI  - Fall precautions -- PT evaluation -- See dizziness --TOC consult - pt lives alone, has family nearby but not 24/7 support

## 2022-01-01 NOTE — ED Provider Notes (Signed)
[facility] Provider Note    Event Date/Time   First MD Initiated Contact with Patient 01/01/22 1327     (approximate)   History   Chief Complaint: Dizziness   HPI  Susan Blackwell is a 88 y.o. female with a history of hypertension, degenerative disc disease, chronic pain who comes ED complaining of dizziness.  Reports that she has been feeling ill, not eating or drinking for the past few days and getting lightheaded.  She has had recurrent falls at home.  Denies chest pain shortness of breath or abdominal pain.     Physical Exam   Triage Vital Signs: ED Triage Vitals  Enc Vitals Group     BP 01/01/22 1325 (!) 196/61     Pulse Rate 01/01/22 1325 66     Resp 01/01/22 1325 18     Temp 01/01/22 1325 98.3 F (36.8 C)     Temp Source 01/01/22 1325 Oral     SpO2 01/01/22 1325 99 %     Weight 01/01/22 1322 85 lb (38.6 kg)     Height 01/01/22 1322 4\' 8"  (1.422 m)     Head Circumference --      Peak Flow --      Pain Score 01/01/22 1322 0     Pain Loc --      Pain Edu? --      Excl. in GC? --     Most recent vital signs: Vitals:   01/01/22 1325 01/01/22 1430  BP: (!) 196/61 (!) 175/68  Pulse: 66 71  Resp: 18 20  Temp: 98.3 F (36.8 C)   SpO2: 99% 96%    General: Awake, no distress.  CV:  Good peripheral perfusion.  Regular rate and rhythm Resp:  Normal effort.  Clear to auscultation bilaterally Abd:  No distention.  Soft and nontender Other:  Head is atraumatic, no midline spinal tenderness.  Dry mucous membranes.  Full range of motion in all extremities, no deformities   ED Results / Procedures / Treatments   Labs (all labs ordered are listed, but only abnormal results are displayed) Labs Reviewed  COMPREHENSIVE METABOLIC PANEL - Abnormal; Notable for the following components:      Result Value   Glucose, Bld 115 (*)    Creatinine, Ser 1.02 (*)    Total Protein 6.1 (*)    GFR, Estimated 49 (*)    All other components within normal  limits  CBC WITH DIFFERENTIAL/PLATELET - Abnormal; Notable for the following components:   RBC 2.35 (*)    Hemoglobin 8.5 (*)    HCT 25.9 (*)    MCV 110.2 (*)    MCH 36.2 (*)    RDW 16.0 (*)    Platelets 125 (*)    Lymphs Abs 0.5 (*)    All other components within normal limits  SARS CORONAVIRUS 2 BY RT PCR  URINALYSIS, ROUTINE W REFLEX MICROSCOPIC  VITAMIN B12  FOLATE  METHYLMALONIC ACID, SERUM  TROPONIN I (HIGH SENSITIVITY)  TROPONIN I (HIGH SENSITIVITY)     EKG Interpreted by me Sinus rhythm rate of 65.  Normal axis, normal intervals.  Poor R wave progression.  Left bundle branch block.  No acute ischemic changes.   RADIOLOGY CT head interpreted by me, negative for intracranial hemorrhage or mass.  Radiology report reviewed.  Chest x-ray unremarkable   PROCEDURES:  Procedures   MEDICATIONS ORDERED IN ED: Medications  sodium chloride 0.9 % bolus 1,000 mL (0 mLs Intravenous Stopped  01/01/22 1524)     IMPRESSION / MDM / ASSESSMENT AND PLAN / ED COURSE  I reviewed the triage vital signs and the nursing notes.                              Differential diagnosis includes, but is not limited to, dehydration, electrode abnormality, viral illness, COVID, AKI, intracranial hemorrhage, skull fracture, pneumonia, UTI, nutritional deficiency, anemia  Patient's presentation is most consistent with acute presentation with potential threat to life or bodily function.  Patient presents with dizziness and weakness along with poor oral intake, suspicious for infectious process although patient is not septic.  CT head negative for acute traumatic findings.  Serum labs show anemia with a hemoglobin of 8.5 compared to her usual baseline of about 9.5.  Platelets are stable, MCV of 110 is abnormal and chronic.  COVID and troponin are negative.  Case discussed with hospitalist for further evaluation.       FINAL CLINICAL IMPRESSION(S) / ED DIAGNOSES   Final diagnoses:  Dizziness   Weakness     Rx / DC Orders   ED Discharge Orders     None        Note:  This document was prepared using Dragon voice recognition software and may include unintentional dictation errors.   Sharman Cheek, MD 01/01/22 1540

## 2022-01-01 NOTE — Assessment & Plan Note (Signed)
-   Hydrocodone-acetaminophen 5-325 mg p.o. daily as needed for moderate pain resumed

## 2022-01-01 NOTE — Assessment & Plan Note (Addendum)
Resume don home hydrochlorothiazide 25 mg daily, metoprolol 50 mg p.o. twice daily, losartan 100 mg daily - Labetalol 5 mg IV every 3 hours as needed for SBP greater than 185

## 2022-01-02 ENCOUNTER — Observation Stay
Admit: 2022-01-02 | Discharge: 2022-01-02 | Disposition: A | Discharge status: Home / Self Care | Payer: Medicare Other | Attending: Internal Medicine | Admitting: Internal Medicine

## 2022-01-02 ENCOUNTER — Other Ambulatory Visit: Payer: Medicare Other

## 2022-01-02 ENCOUNTER — Observation Stay: Payer: Medicare Other

## 2022-01-02 DIAGNOSIS — R296 Repeated falls: Secondary | ICD-10-CM | POA: Diagnosis not present

## 2022-01-02 DIAGNOSIS — R42 Dizziness and giddiness: Secondary | ICD-10-CM

## 2022-01-02 DIAGNOSIS — R011 Cardiac murmur, unspecified: Secondary | ICD-10-CM | POA: Diagnosis present

## 2022-01-02 LAB — CBC
HCT: 22.4 % — ABNORMAL LOW (ref 36.0–46.0)
Hemoglobin: 7.6 g/dL — ABNORMAL LOW (ref 12.0–15.0)
MCH: 36.4 pg — ABNORMAL HIGH (ref 26.0–34.0)
MCHC: 33.9 g/dL (ref 30.0–36.0)
MCV: 107.2 fL — ABNORMAL HIGH (ref 80.0–100.0)
Platelets: 107 10*3/uL — ABNORMAL LOW (ref 150–400)
RBC: 2.09 MIL/uL — ABNORMAL LOW (ref 3.87–5.11)
RDW: 15.5 % (ref 11.5–15.5)
WBC: 4.9 10*3/uL (ref 4.0–10.5)
nRBC: 0 % (ref 0.0–0.2)

## 2022-01-02 LAB — BASIC METABOLIC PANEL
Anion gap: 7 (ref 5–15)
BUN: 18 mg/dL (ref 8–23)
CO2: 25 mmol/L (ref 22–32)
Calcium: 8.5 mg/dL — ABNORMAL LOW (ref 8.9–10.3)
Chloride: 108 mmol/L (ref 98–111)
Creatinine, Ser: 0.93 mg/dL (ref 0.44–1.00)
GFR, Estimated: 55 mL/min — ABNORMAL LOW (ref >60)
Glucose, Bld: 93 mg/dL (ref 70–99)
Potassium: 3.7 mmol/L (ref 3.5–5.1)
Sodium: 140 mmol/L (ref 135–145)

## 2022-01-02 LAB — ECHOCARDIOGRAM COMPLETE
AR max vel: 2.49 cm2
AV Area VTI: 2.97 cm2
AV Area mean vel: 2.64 cm2
AV Mean grad: 10 mmHg
AV Peak grad: 17.8 mmHg
Ao pk vel: 2.11 m/s
Area-P 1/2: 2.91 cm2
Height: 56 in
P 1/2 time: 371 msec
S' Lateral: 1.7 cm
Weight: 1365.09 oz

## 2022-01-02 LAB — MAGNESIUM: Magnesium: 1.2 mg/dL — ABNORMAL LOW (ref 1.7–2.4)

## 2022-01-02 LAB — VITAMIN B12: Vitamin B-12: 1210 pg/mL — ABNORMAL HIGH (ref 180–914)

## 2022-01-02 LAB — TROPONIN I (HIGH SENSITIVITY): Troponin I (High Sensitivity): 18 ng/L — ABNORMAL HIGH (ref <18)

## 2022-01-02 MED ORDER — SODIUM CHLORIDE 0.9 % IV SOLN: INTRAVENOUS | Status: DC | PRN

## 2022-01-02 MED ORDER — LORAZEPAM 2 MG/ML IJ SOLN: 0.5000 mg | Freq: Once | INTRAMUSCULAR | Status: DC | PRN

## 2022-01-02 MED ORDER — INFLUENZA VAC A&B SA ADJ QUAD 0.5 ML IM PRSY
0.5000 mL | PREFILLED_SYRINGE | INTRAMUSCULAR | Status: AC
  Administered 2022-01-03: 0.5 mL via INTRAMUSCULAR
  Filled 2022-01-02: qty 0.5

## 2022-01-02 MED ORDER — MECLIZINE HCL 25 MG PO TABS
25.0000 mg | ORAL_TABLET | Freq: Three times a day (TID) | ORAL | Status: DC | PRN
  Administered 2022-01-02: 25 mg via ORAL
  Filled 2022-01-02 (×2): qty 1

## 2022-01-02 MED ORDER — MAGNESIUM SULFATE 4 GM/100ML IV SOLN
4.0000 g | Freq: Once | INTRAVENOUS | Status: AC
  Administered 2022-01-02: 4 g via INTRAVENOUS
  Filled 2022-01-02: qty 100

## 2022-01-02 MED ORDER — METOPROLOL TARTRATE 50 MG PO TABS
50.0000 mg | ORAL_TABLET | Freq: Two times a day (BID) | ORAL | Status: DC
  Administered 2022-01-02 – 2022-01-03 (×2): 50 mg via ORAL
  Filled 2022-01-02 (×2): qty 1

## 2022-01-02 MED ORDER — HYDROCODONE-ACETAMINOPHEN 5-325 MG PO TABS
1.0000 | ORAL_TABLET | Freq: Three times a day (TID) | ORAL | Status: DC | PRN
  Administered 2022-01-02 – 2022-01-03 (×3): 1 via ORAL
  Filled 2022-01-02 (×3): qty 1

## 2022-01-02 MED ORDER — MECLIZINE HCL 25 MG PO TABS: 12.5000 mg | ORAL_TABLET | Freq: Three times a day (TID) | ORAL | Status: DC | PRN

## 2022-01-02 NOTE — Assessment & Plan Note (Signed)
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

## 2022-01-02 NOTE — Assessment & Plan Note (Signed)
Supportive care, pain control PRN. 

## 2022-01-02 NOTE — Plan of Care (Signed)
  Problem: Coping: Goal: Level of anxiety will decrease Outcome: Progressing   Problem: Pain Managment: Goal: General experience of comfort will improve Outcome: Progressing   Problem: Safety: Goal: Ability to remain free from injury will improve Outcome: Progressing   

## 2022-01-02 NOTE — Evaluation (Signed)
Occupational Therapy Evaluation Patient Details Name: Susan Blackwell MRN: [id number] DOB: 02-14-1920 Today's Date: 01/02/2022   History of Present Illness Ms. Susan Blackwell is a 88 year old female with history of hypertension, insomnia, chronic pain syndrome, lumbar degenerative disease, osteoporosis, generalized osteoarthritis, macrocytic anemia presumed of chronic disease, who presents emergency department for chief concerns of dizziness, weakness, and frequent falls.   Clinical Impression   Patient presenting with decreased independence with self-care, functional mobility, balance. And safety. Pt in bed upon arrival with niece present in room and agreeable to OT services. Pt reports that she lives alone, but her son comes by 1x/week to bring groceries and assist with laundry. She also has a niece that lives near by that often comes by to check in. Patient reports she sponge bathes at baseline and dresses herself independently. Pt has a RW that she can use in the home but usually does furniture cruising and uses the towel rack in the bathroom to assist her in standing. Patient currently functioning at min guard/supervision for bed mobility. Pt unable to perform transfers during session due to not feeling well after vestibular testing. Staff reports supervision with ambulating with RW for toileting task. Pt left in bed with call bell, bed alarm set, niece in room, and other staff present. Patient will benefit from acute OT to increase overall independence in the areas of ADLs, functional mobility, in order to safely discharge home.       Recommendations for follow up therapy are one component of a multi-disciplinary discharge planning process, led by the attending physician.  Recommendations may be updated based on patient status, additional functional criteria and insurance authorization.   Follow Up Recommendations  Home health OT    Assistance Recommended at Discharge Frequent or constant  Supervision/Assistance  Patient can return home with the following A little help with walking and/or transfers;A little help with bathing/dressing/bathroom;Assistance with cooking/housework;Assist for transportation    Functional Status Assessment  Patient has had a recent decline in their functional status and demonstrates the ability to make significant improvements in function in a reasonable and predictable amount of time.  Equipment Recommendations  None recommended by OT    Recommendations for Other Services       Precautions / Restrictions Precautions Precautions: Fall Restrictions Weight Bearing Restrictions: No      Mobility Bed Mobility Overal bed mobility: Needs Assistance Bed Mobility: Supine to Sit, Sit to Supine     Supine to sit: Supervision, Min guard Sit to supine: Supervision, Min guard        Transfers                          Balance Overall balance assessment: Needs assistance   Sitting balance-Leahy Scale: Good                                     ADL either performed or assessed with clinical judgement   ADL Overall ADL's : Needs assistance/impaired                                       General ADL Comments: After vestibular testing patient did not feel well.      Pertinent Vitals/Pain Pain Assessment Pain Assessment: Faces Faces Pain Scale: Hurts even more Pain Location: right rib area  Pain Descriptors / Indicators: Discomfort, Grimacing Pain Intervention(s): Limited activity within patient's tolerance, Monitored during session, Repositioned, RN gave pain meds during session        Extremity/Trunk Assessment Upper Extremity Assessment Upper Extremity Assessment: Defer to OT evaluation   Lower Extremity Assessment Lower Extremity Assessment: Generalized weakness          Cognition Arousal/Alertness: Awake/alert Behavior During Therapy: WFL for tasks assessed/performed Overall  Cognitive Status: Within Functional Limits for tasks assessed                                                  Home Living Family/patient expects to be discharged to:: Private residence Living Arrangements: Alone Available Help at Discharge: Family;Neighbor;Available PRN/intermittently Type of Home: House       Home Layout: One level     Bathroom Shower/Tub: Sponge bathes at baseline   Bathroom Toilet: Standard     Home Equipment: Agricultural consultant (2 wheels)                  OT Problem List: Decreased activity tolerance;Impaired balance (sitting and/or standing);Decreased safety awareness;Pain;Decreased strength      OT Treatment/Interventions: Self-care/ADL training;Therapeutic exercise;Balance training;Energy conservation;Therapeutic activities;DME and/or AE instruction    OT Goals(Current goals can be found in the care plan section) Acute Rehab OT Goals Patient Stated Goal: to return home OT Goal Formulation: With patient Time For Goal Achievement: 04/18/22 Potential to Achieve Goals: Good ADL Goals Pt Will Perform Grooming: with modified independence Pt Will Perform Lower Body Bathing: with modified independence Pt Will Perform Lower Body Dressing: with modified independence Pt Will Transfer to Toilet: with modified independence Pt Will Perform Toileting - Clothing Manipulation and hygiene: with modified independence  OT Frequency: Min 2X/week    Co-evaluation PT/OT/SLP Co-Evaluation/Treatment: Yes Reason for Co-Treatment: To address functional/ADL transfers PT goals addressed during session: Mobility/safety with mobility OT goals addressed during session: ADL's and self-care      AM-PAC OT "6 Clicks" Daily Activity     Outcome Measure Help from another person eating meals?: None Help from another person taking care of personal grooming?: A Little Help from another person toileting, which includes using toliet, bedpan, or urinal?: A  Little Help from another person bathing (including washing, rinsing, drying)?: A Little Help from another person to put on and taking off regular upper body clothing?: None Help from another person to put on and taking off regular lower body clothing?: A Little 6 Click Score: 20   End of Session Nurse Communication: Mobility status  Activity Tolerance: Patient limited by pain;Patient tolerated treatment well Patient left: in bed;with call bell/phone within reach;with bed alarm set  OT Visit Diagnosis: Unsteadiness on feet (R26.81);Repeated falls (R29.6);Muscle weakness (generalized) (M62.81);History of falling (Z91.81);Dizziness and giddiness (R42)                Time: 2355-7322 OT Time Calculation (min): 34 min Charges:  OT General Charges $OT Visit: 1 Visit OT Evaluation $OT Eval Low Complexity: 1 Low    Ayannah Faddis, OTS 01/02/2022, 4:05 PM

## 2022-01-02 NOTE — Assessment & Plan Note (Signed)
Initial Mg level 1.5, given 1g IV replacement. Repeat Mg today 1.2 Ordered 4 g IV Mg-sulfate Repeat Mg level in AM

## 2022-01-02 NOTE — Progress Notes (Addendum)
Mobility Specialist - Progress Note   01/02/22 1100  Mobility  Activity Ambulated with assistance in room  Level of Assistance Standby assist, set-up cues, supervision of patient - no hands on  Assistive Device Front wheel walker  Distance Ambulated (ft) 20 ft  Activity Response Tolerated well  $Mobility charge 1 Mobility     Pt lying in bed upon arrival, utilizing RA. Pt able to complete bed mobility independently. Increased dizziness with transfers and standing activities. Pt would not specify level of dizziness, only that she was "really dizzy". Pt ambulated to bathroom for urinal output with no physical assist needed for peri-hygiene. VC for hand placement to lower self onto toilet. Pt voiced pain in R flank area. Returned to bed with alarm set, needs in reach. Family at bedside.    Filiberto Pinks Mobility Specialist 01/02/22, 12:16 PM

## 2022-01-02 NOTE — Assessment & Plan Note (Signed)
Patient has murmur consistent with aortic stenosis on exam.   -- Echo is pending since AS can contribute to dizziness

## 2022-01-02 NOTE — Progress Notes (Signed)
Progress Note   Patient: Susan Blackwell YHC:[id number] DOB: 1919/11/18 DOA: 01/01/2022     0 DOS: the patient was seen and examined on 01/02/2022   Brief hospital course: Susan Blackwell is a 88 year old female with history of hypertension, insomnia, chronic pain syndrome, lumbar degenerative disease, osteoporosis, generalized osteoarthritis, macrocytic anemia presumed of chronic disease, who presents emergency department for chief concerns of weakness and frequent falls, poor PO intake x 1 week.  She reports severe dizziness and vomited much of the night before presenting.    Initial vitals in the ED notable for blood pressure 196/61 but otherwise within normal limits.    Labs notable for only Cr 1.02, glucose 115, troponin 12, negative procal, Hbg 8.5, platelets 125k.  UA with rare bacteria, small leukocytes, 0-5 wbc's, negative nitrite.   Non-contrast head CT was unremarkable. Chest xray was negative.  Admitted for observation and further evaluation. Started on empiric Rocephin for possible UTI.        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   -- Echo is pending since AS can contribute to dizziness  UTI (urinary tract infection) - Decreased urine output - POA - Ceftriaxone 1 g IV daily, 5 doses ordered --follow urine culture  Restless  legs - She endorses her legs being restless for as long as she can remember and it is worse when she feels cold. - At bedside her legs are frequently moving and she appears to be uncomfortable, she states this is her baseline when her legs get cold - Ropinirole (Requip) low-dose initiated on admission, 0.25 mg p.o. once - Can consider increasing dose as appropriate  Primary osteoarthritis of both shoulders .  Lumbar degenerative disc disease Supportive care. PT eval.  DDD (degenerative disc disease), cervical Supportive care, pain control PRN  Chronic pain syndrome - Hydrocodone-acetaminophen 5-325 mg p.o. daily as needed for moderate pain resumed  Anemia Hbg 8.5 >> 7.6 this AM. Monitor CBC. Appears baseline Hbg around mid 9's.   HTN (hypertension) Resume don home hydrochlorothiazide 25 mg daily, metoprolol 50 mg p.o. twice daily, losartan 100 mg daily - Labetalol 5 mg IV every 3 hours as needed for SBP greater than 185        Subjective: Pt seen with niece at bedside today.  She reports right-sided rib cage pain since her falls.  It's a bit worse with deep breath.  She has waxing/waning spells of dizziness, usually related to position changes but not always.  She had a brief episode during encounter that resolved quickly.  No other acute complaints.  89th birthday is in a couple of weeks.  Physical Exam: Vitals:   01/01/22 2322 01/02/22 0358 01/02/22 0840 01/02/22 1642  BP: (!) 150/48 (!) 142/57 (!) 170/52 (!) 136/37  Pulse: 73 76 72 75  Resp: 18 16 18 17   Temp: 98 F (36.7 C) 98 F (36.7 C) 98.7 F (37.1 C) 97.9 F (36.6  C)  TempSrc:   Oral   SpO2: 96% 90% 95% 94%  Weight: 38.7 kg     Height: 4\' 8"  (1.422 m)      General exam: awake, alert, no acute distress, frail HEENT: moist mucus membranes, hearing grossly normal  Respiratory system: CTAB, no wheezes, rales or rhonchi, normal respiratory effort. Cardiovascular system: normal S1/S2, RRR, 3/6 systolic  murmur,  no pedal edema.   Gastrointestinal system: soft, NT, ND, no HSM felt, +bowel sounds. Central nervous system: A&O x3. no gross focal neurologic deficits, normal speech Extremities: moves all, no edema, normal tone Skin: dry, intact, normal temperature, normal color, No rashes, lesions or ulcers Psychiatry: normal mood, congruent affect, judgement and insight appear normal   Data Reviewed:  Notable labs --- Mg 1.5 >> 1.2, b12 elevated 1210, Hbg 7.6, platelets 107k.   Xray right ribs -- negative, no fractures.  MRI brain -- no acute or subacute infarcts, mild small vessel ischemic disease, remote cerebellar infarcts  Echo - pending   Family Communication: niece at bedside on rounds  Disposition: Status is: Observation The patient remains OBS appropriate and will d/c before 2 midnights.  Remains in hospital due to ongoing evaluation, electrolyte derangement requiring IV replacement.    Planned Discharge Destination: Home with Home Health vs SNF     Time spent: 45 minutes  Author: , DO 01/02/2022 5:31 PM  For on call review [email or web address].

## 2022-01-02 NOTE — Evaluation (Signed)
Physical Therapy Evaluation Patient Details Name: Susan Blackwell MRN: [id number] DOB: [birth year] Today's Date: 01/02/2022  History of Present Illness  Ms. Susan Blackwell is a 88 year old female with history of hypertension, insomnia, chronic pain syndrome, lumbar degenerative disease, osteoporosis, generalized osteoarthritis, macrocytic anemia presumed of chronic disease, who presents emergency department for chief concerns of dizziness, weakness, and frequent falls.  Clinical Impression  Pt received in bed agreeable to PT evaluation. Pt reports of R sided rib pain with movement and breathing. Pt also reported of fall without cause and with dizziness. Pt stated that she has taken Meclizine in past which has helped. Today's assessment of BPPV using log roll technique revealed involvement of horizontal canal 2/2 to  subtle nystagmus noted. Pt  requires CGA to Sup for bed mobility. Pt became very tired to get OOB therefore, PT unable to assess gait and transfers. Pt lives alone and Ind with RW at household level activities with intermittent family member visits. Pt is in pain and is weak. Based on pt's subjective, pt will benefit form HHPT provided she has 24 hour assitacne at home otherwise, pt will benefit from SNF.  Pt agrees that she is unable to go home alone.      Recommendations for follow up therapy are one component of a multi-disciplinary discharge planning process, led by the attending physician.  Recommendations may be updated based on patient status, additional functional criteria and insurance authorization.  Follow Up Recommendations Other (comment) (To be decided based on if pt can get  24 hrs caregiver assitance.)      Assistance Recommended at Discharge Intermittent Supervision/Assistance  Patient can return home with the following  A little help with walking and/or transfers;A little help with bathing/dressing/bathroom;Assistance with cooking/housework;Assistance with feeding;Direct  supervision/assist for medications management;Direct supervision/assist for financial management;Assist for transportation;Help with stairs or ramp for entrance    Equipment Recommendations None recommended by PT  Recommendations for Other Services       Functional Status Assessment Patient has had a recent decline in their functional status and demonstrates the ability to make significant improvements in function in a reasonable and predictable amount of time.     Precautions / Restrictions Precautions Precautions: Fall Restrictions Weight Bearing Restrictions: No      Mobility  Bed Mobility Overal bed mobility: Needs Assistance Bed Mobility: Supine to Sit, Sit to Supine     Supine to sit: Supervision Sit to supine: Supervision, Min guard        Transfers Overall transfer level: Needs assistance                 General transfer comment: Unable ot asses becasue pt tired form Vestibular testing.    Ambulation/Gait Ambulation/Gait assistance:  (Unable ot asses becasue pt tired from Vestibular testing.)                Stairs            Wheelchair Mobility    Modified Rankin (Stroke Patients Only)       Balance                                             Pertinent Vitals/Pain Pain Assessment Pain Assessment: 0-10 Faces Pain Scale: Hurts even more Pain Location: right rib area Pain Descriptors / Indicators: Discomfort, Grimacing Pain Intervention(s): RN gave pain meds during session, Limited activity within patient's  tolerance    Home Living Family/patient expects to be discharged to:: Private residence Living Arrangements: Alone Available Help at Discharge: Family;Neighbor;Available PRN/intermittently Type of Home: House         Home Layout: One level Home Equipment: Agricultural consultant (2 wheels)      Prior Function Prior Level of Function : Independent/Modified Independent             Mobility Comments: Pt  indepednent with RW at household level functional mobility with history of multiple falls 2/2 to dizziness/spinning. ADLs Comments: Independnet with birdbaths, cooking, cleaning, toileting and drressing.     Hand Dominance   Dominant Hand: Right    Extremity/Trunk Assessment   Upper Extremity Assessment Upper Extremity Assessment: Defer to OT evaluation    Lower Extremity Assessment Lower Extremity Assessment: Generalized weakness       Communication   Communication: No difficulties  Cognition Arousal/Alertness: Awake/alert Behavior During Therapy: WFL for tasks assessed/performed Overall Cognitive Status: Within Functional Limits for tasks assessed                                          General Comments      Exercises Other Exercises Other Exercises: Horizontal canal habituation exercise.   Assessment/Plan    PT Assessment Patient needs continued PT services  PT Problem List Decreased strength;Decreased activity tolerance;Decreased balance;Pain       PT Treatment Interventions Gait training;Functional mobility training;Therapeutic activities;Therapeutic exercise;Balance training;Neuromuscular re-education;Patient/family education (Vestibular habituation exs.)    PT Goals (Current goals can be found in the Care Plan section)  Acute Rehab PT Goals Patient Stated Goal: " I can't go home by myself. I am not strong enough." PT Goal Formulation: With patient Time For Goal Achievement: Nov 28, 2021 Potential to Achieve Goals: Good    Frequency Min 2X/week     Co-evaluation   Reason for Co-Treatment: To address functional/ADL transfers PT goals addressed during session: Mobility/safety with mobility OT goals addressed during session: ADL's and self-care       AM-PAC PT "6 Clicks" Mobility  Outcome Measure Help needed turning from your back to your side while in a flat bed without using bedrails?: None Help needed moving from lying on your back  to sitting on the side of a flat bed without using bedrails?: A Little Help needed moving to and from a bed to a chair (including a wheelchair)?: A Little Help needed standing up from a chair using your arms (e.g., wheelchair or bedside chair)?: A Little Help needed to walk in hospital room?: A Little Help needed climbing 3-5 steps with a railing? : A Lot 6 Click Score: 18    End of Session Equipment Utilized During Treatment: Other (comment) (None needed as pt did not get OOB.) Activity Tolerance: Patient limited by pain;Patient limited by fatigue Patient left: in bed;with family/visitor present;with nursing/sitter in room Nurse Communication: Mobility status (Vestibular invovement.) PT Visit Diagnosis: Unsteadiness on feet (R26.81);Repeated falls (R29.6);History of falling (Z91.81);Muscle weakness (generalized) (M62.81);BPPV;Dizziness and giddiness (R42);Pain BPPV - Right/Left : Right Pain - Right/Left: Right Pain - part of body:  (Ribs)    Time: 1416-1510 PT Time Calculation (min) (ACUTE ONLY): 54 min   Charges:   PT Evaluation $PT Eval Moderate Complexity: 1 Mod PT Treatments $Therapeutic Activity: 8-22 mins $Neuromuscular Re-education: 8-22 mins $Canalith Rep Proc: 8-22 mins       Allan Bacigalupi PT DPT 4:12  PM,01/02/22   Glorianne Proctor H Genise Strack 01/02/2022, 3:56 PM

## 2022-01-02 NOTE — Assessment & Plan Note (Signed)
Supportive care. PT eval.

## 2022-01-02 NOTE — Assessment & Plan Note (Signed)
Hbg 8.5 >> 7.6 this AM. Monitor CBC. Appears baseline Hbg around mid 9's.

## 2022-01-03 DIAGNOSIS — R296 Repeated falls: Secondary | ICD-10-CM | POA: Diagnosis not present

## 2022-01-03 LAB — CBC
HCT: 24.4 % — ABNORMAL LOW (ref 36.0–46.0)
Hemoglobin: 8.1 g/dL — ABNORMAL LOW (ref 12.0–15.0)
MCH: 36.5 pg — ABNORMAL HIGH (ref 26.0–34.0)
MCHC: 33.2 g/dL (ref 30.0–36.0)
MCV: 109.9 fL — ABNORMAL HIGH (ref 80.0–100.0)
Platelets: 116 10*3/uL — ABNORMAL LOW (ref 150–400)
RBC: 2.22 MIL/uL — ABNORMAL LOW (ref 3.87–5.11)
RDW: 15.8 % — ABNORMAL HIGH (ref 11.5–15.5)
WBC: 4 10*3/uL (ref 4.0–10.5)
nRBC: 0 % (ref 0.0–0.2)

## 2022-01-03 LAB — BASIC METABOLIC PANEL
Anion gap: 3 — ABNORMAL LOW (ref 5–15)
BUN: 14 mg/dL (ref 8–23)
CO2: 29 mmol/L (ref 22–32)
Calcium: 8.1 mg/dL — ABNORMAL LOW (ref 8.9–10.3)
Chloride: 106 mmol/L (ref 98–111)
Creatinine, Ser: 1 mg/dL (ref 0.44–1.00)
GFR, Estimated: 51 mL/min — ABNORMAL LOW (ref >60)
Glucose, Bld: 95 mg/dL (ref 70–99)
Potassium: 4 mmol/L (ref 3.5–5.1)
Sodium: 138 mmol/L (ref 135–145)

## 2022-01-03 LAB — PHOSPHORUS: Phosphorus: 3.5 mg/dL (ref 2.5–4.6)

## 2022-01-03 LAB — MAGNESIUM: Magnesium: 2.7 mg/dL — ABNORMAL HIGH (ref 1.7–2.4)

## 2022-01-03 MED ORDER — LOSARTAN POTASSIUM 50 MG PO TABS
100.0000 mg | ORAL_TABLET | Freq: Every day | ORAL | Status: DC
  Administered 2022-01-03 – 2022-01-04 (×2): 100 mg via ORAL
  Filled 2022-01-03 (×2): qty 2

## 2022-01-03 MED ORDER — METOPROLOL TARTRATE 25 MG PO TABS
12.5000 mg | ORAL_TABLET | Freq: Two times a day (BID) | ORAL | Status: DC
  Administered 2022-01-03 – 2022-01-04 (×2): 12.5 mg via ORAL
  Filled 2022-01-03 (×2): qty 1

## 2022-01-03 MED ORDER — HYDROCHLOROTHIAZIDE 25 MG PO TABS
25.0000 mg | ORAL_TABLET | Freq: Every day | ORAL | Status: DC
  Administered 2022-01-03 – 2022-01-04 (×2): 25 mg via ORAL
  Filled 2022-01-03 (×2): qty 1

## 2022-01-03 NOTE — Plan of Care (Signed)
  Problem: Activity: Goal: Risk for activity intolerance will decrease Outcome: Progressing   Problem: Coping: Goal: Level of anxiety will decrease Outcome: Progressing   Problem: Pain Managment: Goal: General experience of comfort will improve Outcome: Progressing   Problem: Safety: Goal: Ability to remain free from injury will improve Outcome: Progressing   Problem: Skin Integrity: Goal: Risk for impaired skin integrity will decrease Outcome: Progressing   

## 2022-01-03 NOTE — Progress Notes (Signed)
Occupational Therapy Treatment Patient Details Name: Susan Blackwell MRN: [id number] DOB: 06-Aug-1919 Today's Date: 01/03/2022   History of present illness Susan Blackwell is a 88 year old female with history of hypertension, insomnia, chronic pain syndrome, lumbar degenerative disease, osteoporosis, generalized osteoarthritis, macrocytic anemia presumed of chronic disease, who presents emergency department for chief concerns of dizziness, weakness, and frequent falls.   OT comments  Patient in bed upon arrival and agreeable to OT services. Pt's niece in room also. Pt required min A for bed mobility due to pain in right rib area. Pt with c/o dizziness upon sitting up; rest break encouraged before standing for safety. Pt required supervision for sit <>stand transfers, toileting, peri-care, LB dressing, and grooming tasks. Pt left in bed with call bell, bed alarm set and all needs met.    Recommendations for follow up therapy are one component of a multi-disciplinary discharge planning process, led by the attending physician.  Recommendations may be updated based on patient status, additional functional criteria and insurance authorization.    Follow Up Recommendations  Home health OT    Assistance Recommended at Discharge Frequent or constant Supervision/Assistance  Patient can return home with the following  A little help with walking and/or transfers;A little help with bathing/dressing/bathroom;Assistance with cooking/housework;Assist for transportation   Equipment Recommendations  None recommended by OT       Precautions / Restrictions Precautions Precautions: Fall Restrictions Weight Bearing Restrictions: No       Mobility Bed Mobility Overal bed mobility: Needs Assistance Bed Mobility: Supine to Sit, Sit to Supine     Supine to sit: Min assist     General bed mobility comments: Pt required Min A for bed mobility due to pain in right rib area.    Transfers Overall  transfer level: Needs assistance Equipment used: Rolling walker (2 wheels) Transfers: Sit to/from Stand Sit to Stand: Supervision                 Balance Overall balance assessment: Needs assistance   Sitting balance-Leahy Scale: Good     Standing balance support: Bilateral upper extremity supported, Reliant on assistive device for balance Standing balance-Leahy Scale: Fair                             ADL either performed or assessed with clinical judgement   ADL Overall ADL's : Needs assistance/impaired     Grooming: Wash/dry hands;Supervision/safety               Lower Body Dressing: Supervision/safety   Toilet Transfer: Supervision/safety   Toileting- Clothing Manipulation and Hygiene: Supervision/safety              Extremity/Trunk Assessment Upper Extremity Assessment Upper Extremity Assessment: Overall WFL for tasks assessed   Lower Extremity Assessment Lower Extremity Assessment: Generalized weakness        Vision Baseline Vision/History: 1 Wears glasses            Cognition Arousal/Alertness: Awake/alert Behavior During Therapy: WFL for tasks assessed/performed Overall Cognitive Status: Within Functional Limits for tasks assessed                                                     Pertinent Vitals/ Pain       Pain Assessment Pain Assessment: Faces Faces Pain  Scale: Hurts little more Pain Location: right rib area Pain Descriptors / Indicators: Discomfort, Grimacing, Sore Pain Intervention(s): Limited activity within patient's tolerance, Monitored during session, Repositioned   Frequency  Min 2X/week        Progress Toward Goals  OT Goals(current goals can now be found in the care plan section)  Progress towards OT goals: Progressing toward goals  Acute Rehab OT Goals Patient Stated Goal: to return home. OT Goal Formulation: With patient Time For Goal Achievement: 11/06/2021 Potential to  Achieve Goals: Good  Plan         AM-PAC OT "6 Clicks" Daily Activity     Outcome Measure   Help from another person eating meals?: None Help from another person taking care of personal grooming?: A Little Help from another person toileting, which includes using toliet, bedpan, or urinal?: A Little Help from another person bathing (including washing, rinsing, drying)?: A Little Help from another person to put on and taking off regular upper body clothing?: None Help from another person to put on and taking off regular lower body clothing?: A Little 6 Click Score: 20    End of Session Equipment Utilized During Treatment: Rolling walker (2 wheels)  OT Visit Diagnosis: Unsteadiness on feet (R26.81);Repeated falls (R29.6);Muscle weakness (generalized) (M62.81);History of falling (Z91.81);Dizziness and giddiness (R42)   Activity Tolerance Patient limited by pain;Patient tolerated treatment well   Patient Left in bed;with call bell/phone within reach;with bed alarm set   Nurse Communication Mobility status        Time: 1103-1594 OT Time Calculation (min): 18 min  Charges: OT General Charges $OT Visit: 1 Visit OT Treatments $Self Care/Home Management : 8-22 mins    Jaysie Benthall, OTS 01/03/2022, 3:58 PM

## 2022-01-03 NOTE — Progress Notes (Addendum)
Attempt to contact patient's son regarding discharge plan and home care providers. No answer @ [phone]  [phone]. Attempt to reach patient's in room. Patient's niece answered and stated patient's son left to go home.

## 2022-01-03 NOTE — Progress Notes (Signed)
Progress Note   Patient: Susan Blackwell LOV:[id number] DOB: 1919/07/15 DOA: 01/01/2022     0 DOS: the patient was seen and examined on 01/03/2022   Brief hospital course: Ms. Susan Blackwell is a 88 year old female with history of hypertension, insomnia, chronic pain syndrome, lumbar degenerative disease, osteoporosis, generalized osteoarthritis, macrocytic anemia presumed of chronic disease, who presents emergency department for chief concerns of weakness and frequent falls, poor PO intake x 1 week.  She reports severe dizziness and vomited much of the night before presenting.     Initial vitals in the ED notable for blood pressure 196/61 but otherwise within normal limits.     Labs notable for only Cr 1.02, glucose 115, troponin 12, negative procal, Hbg 8.5, platelets 125k.  UA with rare bacteria, small leukocytes, 0-5 wbc's, negative nitrite.   Non-contrast head CT was unremarkable. Chest xray was negative.   Admitted for observation and further evaluation. Started on empiric Rocephin for possible UTI.   Assessment and Plan: * Frequent falls Suspect due to lightheadedness which is chronic. Reports several days nausea/vomiting last week (now resolved) that may have contributed to volume depletion. Orthostats negative. No signs bppv on PT eval. PT has evaluated, OK with home w/ HH though needs supervision. Pt not interested in snf. Several home meds likely contributory -TOC to discuss options w/ family  Hypomagnesemia Resolved w/ repletion  Dizziness See above. Home hctz and losartan and metoprolol may contribute, consider dose adjustments at d/c  Systolic murmur No significant findings on TTE  Pyuria - asymptomatic - will d/c abx  Restless legs - started on requip but this can contribute to hypotension and falls, will d/c - f/u ferritin  Primary osteoarthritis of both shoulders  Lumbar degenerative disc disease Supportive care. PT eval.  DDD (degenerative disc disease),  cervical Supportive care, pain control PRN  Chronic pain syndrome - Hydrocodone-acetaminophen 5-325 mg p.o. daily as needed for moderate pain resumed. Contributes to   Anemia Hbg stbale in 8s, no bleeding Monitor CBC. Appears baseline Hbg around mid 9's.   HTN (hypertension) Cont home hctz and losart, will reduce dose of metop, goal to wean off - Labetalol 5 mg IV every 3 hours as needed for SBP greater than 185     Subjective: feeling well, ambuated with pt no dizziness  Physical Exam: Vitals:   01/02/22 1642 01/02/22 1956 01/03/22 0443 01/03/22 0820  BP: (!) 136/37 (!) 136/59 (!) 144/62 (!) 156/51  Pulse: 75 75 64 72  Resp: 17 18 16  (!) 23  Temp: 97.9 F (36.6 C) 98.3 F (36.8 C)  98.5 F (36.9 C)  TempSrc:  Oral    SpO2: 94% 90% 97%   Weight:      Height:       General exam: awake, alert, no acute distress, frail HEENT: moist mucus membranes, hearing grossly normal  Respiratory system: CTAB, no wheezes, rales or rhonchi, normal respiratory effort. Cardiovascular system: normal S1/S2, RRR, 3/6 systolic murmur,  no pedal edema.   Gastrointestinal system: soft, NT, ND, no HSM felt, +bowel sounds. Central nervous system: A&O x3. no gross focal neurologic deficits, normal speech Extremities: moves all, no edema, normal tone Skin: dry, intact, normal temperature, normal color, No rashes, lesions or ulcers Psychiatry: normal mood, congruent affect, judgement and insight appear normal   Family Communication: niece at bedside on rounds  Disposition: Status is: Observation The patient remains OBS appropriate and will d/c before 2 midnights.  Remains in hospital due to ongoing evaluation, electrolyte derangement  requiring IV replacement.    Planned Discharge Destination: Home with Home Health    Author: Silvano Bilis, MD 01/03/2022 1:57 PM  For on call review [email or web address].

## 2022-01-03 NOTE — Progress Notes (Signed)
Physical Therapy Treatment Patient Details Name: Susan Blackwell MRN: [id number] DOB: Nov 09, 1919 Today's Date: 01/03/2022   History of Present Illness Ms. Susan Blackwell is a 88 year old female with history of hypertension, insomnia, chronic pain syndrome, lumbar degenerative disease, osteoporosis, generalized osteoarthritis, macrocytic anemia presumed of chronic disease, who presents emergency department for chief concerns of dizziness, weakness, and frequent falls.    PT Comments    Patient in recliner at start of session. Sit <> stand with RW and CGA, pt endorsed some dizziness with standing that resolved when she returned to rest. Pt tested for BPPV (horizontal canal and posterior canal assessed bilaterally) pt without symptoms and no nystagmus noted. She did also ambulate to the bathroom ~23ft with RW And CGA, no LOB but pt did endorse feeling nervous of falling. Perciare and hand washing with CGA as well. Discharge recommendations at this time is HHPT with frequent-constant supervision/assistance to maximize safety and function. If unable to obtain adequate supervision, may benefit from SNF to improve function, safety, and further education.      Recommendations for follow up therapy are one component of a multi-disciplinary discharge planning process, led by the attending physician.  Recommendations may be updated based on patient status, additional functional criteria and insurance authorization.  Follow Up Recommendations  Home health PT     Assistance Recommended at Discharge Frequent or constant Supervision/Assistance  Patient can return home with the following A little help with walking and/or transfers;A little help with bathing/dressing/bathroom;Assistance with cooking/housework;Assistance with feeding;Direct supervision/assist for medications management;Direct supervision/assist for financial management;Assist for transportation;Help with stairs or ramp for entrance   Equipment  Recommendations  Rolling walker (2 wheels)    Recommendations for Other Services       Precautions / Restrictions Precautions Precautions: Fall Restrictions Weight Bearing Restrictions: No     Mobility  Bed Mobility Overal bed mobility: Needs Assistance Bed Mobility: Supine to Sit, Sit to Supine     Supine to sit: Min guard Sit to supine: Min guard        Transfers Overall transfer level: Needs assistance Equipment used: Rolling walker (2 wheels) Transfers: Sit to/from Stand Sit to Stand: Min guard                Ambulation/Gait Ambulation/Gait assistance: Land (Feet): 30 Feet Assistive device: Rolling walker (2 wheels)             Stairs             Wheelchair Mobility    Modified Rankin (Stroke Patients Only)       Balance Overall balance assessment: Needs assistance   Sitting balance-Leahy Scale: Good     Standing balance support: Reliant on assistive device for balance                                Cognition Arousal/Alertness: Awake/alert Behavior During Therapy: WFL for tasks assessed/performed Overall Cognitive Status: Within Functional Limits for tasks assessed                                          Exercises Other Exercises Other Exercises: able to perform pericare as well as hand washing with CGA    General Comments        Pertinent Vitals/Pain Pain Assessment Pain Assessment: Faces Faces Pain Scale: Hurts little more  Pain Location: right rib area Pain Descriptors / Indicators: Discomfort, Grimacing, Sore Pain Intervention(s): Limited activity within patient's tolerance, Monitored during session, Repositioned    Home Living                          Prior Function            PT Goals (current goals can now be found in the care plan section) Progress towards PT goals: Progressing toward goals    Frequency    Min 2X/week      PT Plan  Current plan remains appropriate    Co-evaluation              AM-PAC PT "6 Clicks" Mobility   Outcome Measure  Help needed turning from your back to your side while in a flat bed without using bedrails?: None Help needed moving from lying on your back to sitting on the side of a flat bed without using bedrails?: A Little Help needed moving to and from a bed to a chair (including a wheelchair)?: A Little Help needed standing up from a chair using your arms (e.g., wheelchair or bedside chair)?: A Little Help needed to walk in hospital room?: A Little Help needed climbing 3-5 steps with a railing? : A Little 6 Click Score: 19    End of Session Equipment Utilized During Treatment: Gait belt Activity Tolerance: Patient tolerated treatment well Patient left: in bed;with call bell/phone within reach;with bed alarm set Nurse Communication: Mobility status PT Visit Diagnosis: Unsteadiness on feet (R26.81);Repeated falls (R29.6);History of falling (Z91.81);Muscle weakness (generalized) (M62.81);BPPV;Dizziness and giddiness (R42);Pain BPPV - Right/Left : Right Pain - Right/Left: Right Pain - part of body:  (ribs)     Time: 4268-3419 PT Time Calculation (min) (ACUTE ONLY): 32 min  Charges:  $Therapeutic Activity: 23-37 mins                     Olga Coaster PT, DPT 1:19 PM,01/03/22

## 2022-01-04 DIAGNOSIS — R296 Repeated falls: Secondary | ICD-10-CM | POA: Diagnosis not present

## 2022-01-04 LAB — URINE CULTURE: Culture: 50000 — AB

## 2022-01-04 NOTE — TOC Transition Note (Signed)
Transition of Care [facility]) - CM/SW Discharge Note   Patient Details  Name: Susan Blackwell MRN: [id number] Date of Birth: 06/07/1919  Transition of Care [facility]) CM/SW Contact:  Truddie Hidden, RN Phone Number: 01/04/2022, 4:19 PM   Clinical Narrative:    Family in room requesting BSC. Requested DME via [employer].    Final next level of care: Home w Home Health Services Barriers to Discharge: Barriers Resolved   Patient Goals and CMS Choice Patient states their goals for this hospitalization and ongoing recovery are:: Return home CMS Medicare.gov Compare Post Acute Care list provided to:: Other (Comment Required) (Patient's son)    Discharge Placement                       Discharge Plan and Services                DME Arranged: Walker rolling DME Agency: [employer] Date DME Agency Contacted: 01/04/22 Time DME Agency Contacted: [phone] Representative spoke with at DME Agency: [place] HH Arranged: PT, OT HH Agency: [facility] Date [facility] Agency Contacted: 01/04/22 Time HH Agency Contacted: 1000 Representative spoke with at [facility] Agency: Becky Sax  Social Determinants of Health (SDOH) Interventions     Readmission Risk Interventions     No data to display

## 2022-01-04 NOTE — Progress Notes (Signed)
Mobility Specialist - Progress Note   01/04/22 1100  Orthostatic Lying   BP- Lying 149/70  Pulse- Lying 79  Orthostatic Sitting  BP- Sitting 123/80  Pulse- Sitting 87  Orthostatic Standing at 0 minutes  BP- Standing at 0 minutes (!) 136/96  Pulse- Standing at 0 minutes 92  Orthostatic Standing at 3 minutes  BP- Standing at 3 minutes 154/64  Pulse- Standing at 3 minutes 98  Mobility  Activity Ambulated with assistance in hallway;Stood at bedside  Level of Assistance Standby assist, set-up cues, supervision of patient - no hands on  Assistive Device Front wheel walker  Distance Ambulated (ft) 180 ft  Activity Response Tolerated well  $Mobility charge 1 Mobility     Pt lying in bed upon arrival, utilizing RA. Pt voices feeling really dizzy and weak upon entry, but agreeable to activity. Orthostatics recorded above. Pt able to complete bed mobility independently with increased dizziness upon sitting and standing 7/10. Fear of falling. Pt cued to open eyes to assist with decreasing dizziness, but states she feels more dizzy with eyes opened. Pt completed 1 lap around nurses station with minG. Mild LOB x1 as pt lifts RW 2 wheels off the ground, corrected by CGA. Pt reports dizziness slightly decreasing 5/10 with ambulation, but feels weaker with increased distance. Upon return to room, pt participated in bed-level therex and encouraged to continue exercises in between sessions to prevent further decline. Pt showed understanding. Pt left in bed with alarm set, needs in reach.    Filiberto Pinks Mobility Specialist 01/04/22, 11:48 AM

## 2022-01-04 NOTE — Progress Notes (Addendum)
Spoke with Aurther Loft, Patient's son regarding discharge plan. Terry agreeable to [facility]. No preference stated. Referral sent to [facility]. Requested RW via [employer]. Will provide patient with Home Care agencies.   Spoke with patient at bedside. Provided information for home care agencies. Son not present in the room. Advised home health would be arranged   11:40 Patient son wanted information regarding SNF at [facility]. Advised patient would have to be a resident at [facility] previous to this admission to qualify. Son advised other SNF may be an option. Patient son stated they would prefer Home health and home care agencies.  2:01pm Spoke with son to confirm discharge plan for [facility]. Demetrios Loll was contacted by family and was accepted. Family request patient be transported by EMS. Advised son a bill for EMS may be incurred family wishes to proceed with transport for 7pm. EMS packet arranged. TOC signing off

## 2022-01-04 NOTE — Progress Notes (Signed)
Patient is not able to walk the distance required to go the bathroom, or he/she is unable to safely negotiate stairs required to access the bathroom.  A BSC will alleviate this problem. 

## 2022-01-04 NOTE — Discharge Summary (Signed)
Susan Blackwell WUJ:[id number] DOB: 12-09-1919 DOA: 01/01/2022  PCP: Lauro Regulus, MD  Admit date: 01/01/2022 Discharge date: 01/04/2022  Time spent: 35 minutes  Recommendations for Outpatient Follow-up:  Pcp f/u     Discharge Diagnoses:  Principal Problem:   Frequent falls Active Problems:   HTN (hypertension)   Anemia   Chronic pain syndrome   DDD (degenerative disc disease), cervical   Lumbar degenerative disc disease   Primary osteoarthritis of both shoulders   Restless legs   UTI (urinary tract infection)   Systolic murmur   Dizziness   Hypomagnesemia   Discharge Condition: stable  Diet recommendation: regular  Filed Weights   01/01/22 1322 01/01/22 2322  Weight: 38.6 kg 38.7 kg    History of present illness:  From admission h and p Susan Blackwell is a 88 year old female with history of hypertension, insomnia, chronic pain syndrome, lumbar degenerative disease, osteoporosis, generalized osteoarthritis, macrocytic anemia presumed of chronic disease, who presents emergency department for chief concerns of weakness and frequent falls.    At bedside, she is able to tell me her name, age, current year.   At baseline, she reports she puts her own cloths on, does her cooking, and does the dishes. Her son does the grocery shopping and laundry. Her niece lives down the street.  Her son visits her once per week.   She reports poor p.o. intake over the last week. She reports last Friday, she vomited all night. She vomited orange sherbet and foam.  She denies bright red blood and coffee-ground emesis.  She denies diarrhea.  She endorses increase falling and dizziness. She reports falling on Friday.  She reports she has not fallen since.   She denies trauma to her person, known fever, chills, cough, chest pain, shortness of breath, abdominal pain, diarrhea.  She endorses decreased urine output for the past several days.    Hospital Course:  Frequent falls Suspect  due to lightheadedness which is chronic. Reports several days nausea/vomiting last week (now resolved) that may have contributed to volume depletion. Orthostats negative. No signs bppv on PT eval. PT has evaluated, OK with home w/ HH though needs supervision. Pt not interested in snf. Several home meds likely contributory but bp is quite elevated so hesitant to make dose reductions. Home health pt/ot ordered   Hypomagnesemia Resolved w/ repletion   Lightheadedness See above.    Systolic murmur No significant findings on TTE   Pyuria - asymptomatic, culture with 50k klebsiella - no antibiotics at discharge   Chronic pain syndrome - Hydrocodone-acetaminophen 5-325 mg p.o. daily as needed for moderate pain resumed. Contributes to    Anemia Hbg stbale in 8s, no bleeding Monitor CBC. Appears baseline Hbg around mid 9's.   HTN (hypertension) Home meds may contribute to lighheadedness but here bp elevated so continued her home meds.    Procedures: none   Consultations: none  Discharge Exam: Vitals:   01/04/22 0509 01/04/22 0800  BP: (!) 155/64 (!) 190/66  Pulse: 72 91  Resp: 18 16  Temp: 98 F (36.7 C) 98.2 F (36.8 C)  SpO2: 93% 98%    General: nad Cardiovascular: rrr Respiratory: ctab  Discharge Instructions   Discharge Instructions     Diet - low sodium heart healthy   Complete by: As directed    Increase activity slowly   Complete by: As directed       Allergies as of 01/04/2022       Reactions   Tramadol Nausea  Only   Nsaids Swelling        Medication List     TAKE these medications    Arnicare Gel Apply 1 application topically daily as needed (pain).   Caltrate 600+D Plus Minerals 600-800 MG-UNIT Tabs Take 1 tablet by mouth daily.   Cholecalciferol 50 MCG (2000 UT) Tabs Take 1,000 Units by mouth daily.   cyanocobalamin 1000 MCG tablet Commonly known as: VITAMIN B12 Take 1,000 mcg by mouth daily.   hydrochlorothiazide 25 MG  tablet Commonly known as: HYDRODIURIL Take 25 mg by mouth daily.   HYDROcodone-acetaminophen 5-325 MG tablet Commonly known as: NORCO/VICODIN Take 1 tablet by mouth daily as needed for moderate pain. For chronic pain syndrome   losartan 100 MG tablet Commonly known as: COZAAR Take 100 mg by mouth daily.   melatonin 3 MG Tabs tablet Take 3 mg by mouth at bedtime.   metoprolol tartrate 50 MG tablet Commonly known as: LOPRESSOR Take 50 mg by mouth 2 (two) times daily.   pantoprazole 40 MG tablet Commonly known as: PROTONIX Take 40 mg by mouth daily.   potassium chloride 10 MEQ tablet Commonly known as: KLOR-CON Take 10 mEq by mouth daily.   TYLENOL ARTHRITIS PAIN PO Take 650 mg by mouth 2 (two) times daily.   VISINE OP Apply 1 drop to eye daily as needed (dry eyes).               Durable Medical Equipment  (From admission, onward)           Start     Ordered   01/04/22 1224  For home use only DME Walker rolling  Once       Question Answer Comment  Walker: With 5 Inch Wheels   Patient needs a walker to treat with the following condition Weakness generalized      01/04/22 1223           Allergies  Allergen Reactions   Tramadol Nausea Only   Nsaids Swelling    Follow-up Information     Kirk Ruths, MD Follow up.   Specialty: Internal Medicine Contact information: [address] [place] [address] [phone]                  The results of significant diagnostics from this hospitalization (including imaging, microbiology, ancillary and laboratory) are listed below for reference.    Significant Diagnostic Studies: ECHOCARDIOGRAM COMPLETE  Result Date: 01/02/2022    ECHOCARDIOGRAM REPORT   Patient Name:   Susan Blackwell Date of Exam: 01/02/2022 Medical Rec #:  [id number]      Height:       56.0 in Accession #:    [id number]     Weight:       85.3 lb Date of Birth:  1919-06-08      BSA:           1.236 m Patient Age:    63 years       BP:           170/52 mmHg Patient Gender: F              HR:           78 bpm. Exam Location:  [facility] Procedure: 2D Echo, Cardiac Doppler and Color Doppler Indications:     R01.1 Murmur  History:         Patient has no prior history of Echocardiogram examinations.  Risk Factors:Hypertension.  Sonographer:     Ceasar Mons Referring Phys:  [id number] [facility] A GRIFFITH Diagnosing Phys: Adrian Blackwater  Sonographer Comments: Image acquisition challenging due to uncooperative patient, Image acquisition challenging due to patient body habitus and Image acquisition challenging due to respiratory motion. IMPRESSIONS  1. Left ventricular ejection fraction, by estimation, is 60 to 65%. The left ventricle has normal function. The left ventricle has no regional wall motion abnormalities. Left ventricular diastolic parameters are consistent with Grade I diastolic dysfunction (impaired relaxation).  2. Right ventricular systolic function is normal. The right ventricular size is normal.  3. Left atrial size was mildly dilated.  4. Right atrial size was mildly dilated.  5. The mitral valve is degenerative. Mild to moderate mitral valve regurgitation. No evidence of mitral stenosis. Severe mitral annular calcification.  6. Tricuspid valve regurgitation is mild to moderate.  7. The aortic valve is calcified. Aortic valve regurgitation is trivial. Aortic valve sclerosis/calcification is present, without any evidence of aortic stenosis.  8. The inferior vena cava is normal in size with greater than 50% respiratory variability, suggesting right atrial pressure of 3 mmHg. FINDINGS  Left Ventricle: Left ventricular ejection fraction, by estimation, is 60 to 65%. The left ventricle has normal function. The left ventricle has no regional wall motion abnormalities. The left ventricular internal cavity size was normal in size. There is  no left ventricular hypertrophy. Left ventricular  diastolic parameters are consistent with Grade I diastolic dysfunction (impaired relaxation). Right Ventricle: The right ventricular size is normal. No increase in right ventricular wall thickness. Right ventricular systolic function is normal. Left Atrium: Left atrial size was mildly dilated. Right Atrium: Right atrial size was mildly dilated. Pericardium: There is no evidence of pericardial effusion. Mitral Valve: The mitral valve is degenerative in appearance. Severe mitral annular calcification. Mild to moderate mitral valve regurgitation. No evidence of mitral valve stenosis. Tricuspid Valve: The tricuspid valve is normal in structure. Tricuspid valve regurgitation is mild to moderate. No evidence of tricuspid stenosis. Aortic Valve: The aortic valve is calcified. Aortic valve regurgitation is trivial. Aortic regurgitation PHT measures 371 msec. Aortic valve sclerosis/calcification is present, without any evidence of aortic stenosis. Aortic valve mean gradient measures 10.0 mmHg. Aortic valve peak gradient measures 17.8 mmHg. Aortic valve area, by VTI measures 2.97 cm. Pulmonic Valve: The pulmonic valve was normal in structure. Pulmonic valve regurgitation is trivial. No evidence of pulmonic stenosis. Aorta: The aortic root is normal in size and structure. Venous: The inferior vena cava is normal in size with greater than 50% respiratory variability, suggesting right atrial pressure of 3 mmHg. IAS/Shunts: No atrial level shunt detected by color flow Doppler.  LEFT VENTRICLE PLAX 2D LVIDd:         3.00 cm   Diastology LVIDs:         1.70 cm   LV e' medial:    6.34 cm/s LV PW:         1.00 cm   LV E/e' medial:  15.4 LV IVS:        1.00 cm   LV e' lateral:   6.96 cm/s LVOT diam:     1.90 cm   LV E/e' lateral: 14.1 LV SV:         115 LV SV Index:   93 LVOT Area:     2.84 cm  RIGHT VENTRICLE RV Basal diam:  3.00 cm RV Mid diam:    2.40 cm RV S prime:  15.20 cm/s TAPSE (M-mode): 2.0 cm LEFT ATRIUM              Index        RIGHT ATRIUM           Index LA diam:        2.70 cm 2.18 cm/m   RA Area:     10.30 cm LA Vol (A2C):   43.6 ml 35.28 ml/m  RA Volume:   22.20 ml  17.96 ml/m LA Vol (A4C):   41.8 ml 33.82 ml/m LA Biplane Vol: 43.1 ml 34.87 ml/m  AORTIC VALVE AV Area (Vmax):    2.49 cm AV Area (Vmean):   2.64 cm AV Area (VTI):     2.97 cm AV Vmax:           211.00 cm/s AV Vmean:          145.000 cm/s AV VTI:            0.387 m AV Peak Grad:      17.8 mmHg AV Mean Grad:      10.0 mmHg LVOT Vmax:         185.00 cm/s LVOT Vmean:        135.000 cm/s LVOT VTI:          0.405 m LVOT/AV VTI ratio: 1.05 AI PHT:            371 msec  AORTA Ao Root diam: 2.70 cm Ao Asc diam:  3.10 cm MITRAL VALVE                TRICUSPID VALVE MV Area (PHT): 2.91 cm     TR Peak grad:   47.1 mmHg MV Decel Time: 261 msec     TR Vmax:        343.00 cm/s MV E velocity: 97.90 cm/s MV A velocity: 152.00 cm/s  SHUNTS MV E/A ratio:  0.64         Systemic VTI:  0.40 m                             Systemic Diam: 1.90 cm Adrian BlackwaterShaukat Khan Electronically signed by Adrian BlackwaterShaukat Khan Signature Date/Time: 01/02/2022/10:26:24 PM    Final    DG Ribs Unilateral Right  Result Date: 01/02/2022 CLINICAL DATA:  Fall.  Right chest pain EXAM: RIGHT RIBS - 2 VIEW COMPARISON:  Chest 01/01/2022 FINDINGS: Chronic fracture right fourth rib posteriorly. No acute fracture or mass. No effusion or pneumothorax. IMPRESSION: Chronic fracture right fourth rib.  No acute fracture. Electronically Signed   By: Marlan Palauharles  Clark M.D.   On: 01/02/2022 15:53   MR BRAIN WO CONTRAST  Result Date: 01/02/2022 CLINICAL DATA:  Dizziness, nonspecific.  Falling. EXAM: MRI HEAD WITHOUT CONTRAST TECHNIQUE: Multiplanar, multiecho pulse sequences of the brain and surrounding structures were obtained without intravenous contrast. COMPARISON:  Head CT yesterday.  MRI 10/06/2016. FINDINGS: Brain: Diffusion imaging does not show any acute or subacute infarction or other cause of restricted diffusion.  Minimal chronic small-vessel ischemic change affects the pons. Few old small vessel cerebellar infarctions. Cerebral hemispheres show age related volume loss with mild for age chronic small-vessel ischemic change of the white matter. No cortical or large vessel territory infarction. No mass lesion, hemorrhage, hydrocephalus or extra-axial collection. Vascular: Diminished vascular in flow signal in the right internal carotid artery could suggest the presence right carotid bifurcation disease. Vessel does show flow. Skull and upper cervical spine:  Normal Sinuses/Orbits: Clear presently. Previous functional endoscopic sinus surgery. Orbits negative. Other: None IMPRESSION: No acute or subacute infarction. Age related volume loss. Mild chronic small-vessel ischemic changes of the cerebral hemispheric white matter and pons. Few old small vessel cerebellar infarctions. Flow sensitive T1 weighted imaging suggests that there could be slow in flow in the right carotid system, raising the possibility of right carotid bifurcation disease. Electronically Signed   By: Paulina Fusi M.D.   On: 01/02/2022 14:22   DG Chest Portable 1 View  Result Date: 01/01/2022 CLINICAL DATA:  Dizziness, falls EXAM: PORTABLE CHEST 1 VIEW COMPARISON:  Aug 23, 2018, September 30, 2017 FINDINGS: The cardiomediastinal silhouette is unchanged in contour. Unchanged aortic calcified atherosclerosis. No acute pleuroparenchymal abnormality. No pleural effusion. No pneumothorax. Visualized abdomen is unremarkable. Multilevel degenerative changes of the thoracic spine and mild scoliosis. IMPRESSION: No acute cardiopulmonary abnormality. Electronically Signed   By: Jacob Moores M.D.   On: 01/01/2022 14:33   CT Head Wo Contrast  Result Date: 01/01/2022 CLINICAL DATA:  Head trauma, minor (Age >= 65y) EXAM: CT HEAD WITHOUT CONTRAST TECHNIQUE: Contiguous axial images were obtained from the base of the skull through the vertex without intravenous contrast.  RADIATION DOSE REDUCTION: This exam was performed according to the departmental dose-optimization program which includes automated exposure control, adjustment of the mA and/or kV according to patient size and/or use of iterative reconstruction technique. COMPARISON:  11/07/2020 FINDINGS: Brain: There is no acute intracranial hemorrhage, mass effect, or edema. Gray-white differentiation is preserved. There is no extra-axial fluid collection. Prominence of the ventricles and sulci reflects similar parenchymal volume loss. Patchy hypoattenuation in the supratentorial white matter is nonspecific but probably reflects similar chronic microvascular ischemic changes. Vascular: There is atherosclerotic calcification at the skull base. Skull: Calvarium is unremarkable. Sinuses/Orbits: No acute finding. Evidence of prior sinonasal surgery mucosal thickening. Other: None. IMPRESSION: No acute intracranial abnormality. Electronically Signed   By: Guadlupe Spanish M.D.   On: 01/01/2022 14:23    Microbiology: Recent Results (from the past 240 hour(s))  SARS Coronavirus 2 by RT PCR (hospital order, performed in [facility] hospital lab) *cepheid single result test* Anterior Nasal Swab     Status: None   Collection Time: 01/01/22  1:45 PM   Specimen: Anterior Nasal Swab  Result Value Ref Range Status   SARS Coronavirus 2 by RT PCR NEGATIVE NEGATIVE Final    Comment: (NOTE) SARS-CoV-2 target nucleic acids are NOT DETECTED.  The SARS-CoV-2 RNA is generally detectable in upper and lower respiratory specimens during the acute phase of infection. The lowest concentration of SARS-CoV-2 viral copies this assay can detect is 250 copies / mL. A negative result does not preclude SARS-CoV-2 infection and should not be used as the sole basis for treatment or other patient management decisions.  A negative result may occur with improper specimen collection / handling, submission of specimen other than nasopharyngeal swab,  presence of viral mutation(s) within the areas targeted by this assay, and inadequate number of viral copies (<250 copies / mL). A negative result must be combined with clinical observations, patient history, and epidemiological information.  Fact Sheet for Patients:   RoadLapTop.co.za  Fact Sheet for Healthcare Providers: http://kim-miller.com/  This test is not yet approved or  cleared by the [place] FDA and has been authorized for detection and/or diagnosis of SARS-CoV-2 by FDA under an Emergency Use Authorization (EUA).  This EUA will remain in effect (meaning this test can be used) for the duration  of the COVID-19 declaration under Section 564(b)(1) of the Act, 21 U.S.C. section 360bbb-3(b)(1), unless the authorization is terminated or revoked sooner.  Performed at [facility], [address], [place], [place] [address]   Urine Culture     Status: Abnormal   Collection Time: 01/01/22  6:30 PM   Specimen: Urine, Clean Catch  Result Value Ref Range Status   Specimen Description   Final    URINE, CLEAN CATCH Performed at [facility], [address] [place], [place] [address]    Special Requests   Final    NONE Performed at [facility], [place], [place] [address]    Culture 50,000 COLONIES/mL KLEBSIELLA PNEUMONIAE (A)  Final   Report Status 01/04/2022 FINAL  Final   Organism ID, Bacteria KLEBSIELLA PNEUMONIAE (A)  Final      Susceptibility   Klebsiella pneumoniae - MIC*    AMPICILLIN RESISTANT Resistant     CEFAZOLIN <=4 SENSITIVE Sensitive     CEFEPIME <=0.12 SENSITIVE Sensitive     CEFTRIAXONE <=0.25 SENSITIVE Sensitive     CIPROFLOXACIN <=0.25 SENSITIVE Sensitive     GENTAMICIN <=1 SENSITIVE Sensitive     IMIPENEM <=0.25 SENSITIVE Sensitive     NITROFURANTOIN 64 INTERMEDIATE Intermediate     TRIMETH/SULFA <=20 SENSITIVE Sensitive     AMPICILLIN/SULBACTAM 4 SENSITIVE  Sensitive     PIP/TAZO <=4 SENSITIVE Sensitive     * 50,000 COLONIES/mL KLEBSIELLA PNEUMONIAE     Labs: Basic Metabolic Panel: Recent Labs  Lab 01/01/22 1326 01/01/22 1540 01/02/22 0506 01/03/22 0432  NA 141  --  140 138  K 3.9  --  3.7 4.0  CL 107  --  108 106  CO2 27  --  25 29  GLUCOSE 115*  --  93 95  BUN 20  --  18 14  CREATININE 1.02*  --  0.93 1.00  CALCIUM 9.3  --  8.5* 8.1*  MG  --  1.5* 1.2* 2.7*  PHOS  --  4.0  --  3.5   Liver Function Tests: Recent Labs  Lab 01/01/22 1326  AST 24  ALT 15  ALKPHOS 59  BILITOT 1.0  PROT 6.1*  ALBUMIN 3.9   No results for input(s): "LIPASE", "AMYLASE" in the last 168 hours. No results for input(s): "AMMONIA" in the last 168 hours. CBC: Recent Labs  Lab 01/01/22 1326 01/02/22 0506 01/03/22 0432  WBC 4.4 4.9 4.0  NEUTROABS 3.2  --   --   HGB 8.5* 7.6* 8.1*  HCT 25.9* 22.4* 24.4*  MCV 110.2* 107.2* 109.9*  PLT 125* 107* 116*   Cardiac Enzymes: No results for input(s): "CKTOTAL", "CKMB", "CKMBINDEX", "TROPONINI" in the last 168 hours. BNP: BNP (last 3 results) No results for input(s): "BNP" in the last 8760 hours.  ProBNP (last 3 results) No results for input(s): "PROBNP" in the last 8760 hours.  CBG: No results for input(s): "GLUCAP" in the last 168 hours.     Signed:  Desma Maxim MD.  [facility] 01/04/2022, 1:53 PM

## 2022-01-05 LAB — METHYLMALONIC ACID, SERUM: Methylmalonic Acid, Quantitative: 174 nmol/L (ref 0–378)

## 2022-01-31 ENCOUNTER — Other Ambulatory Visit: Payer: Self-pay | Admitting: *Deleted

## 2022-01-31 ENCOUNTER — Telehealth: Payer: Self-pay | Admitting: Student in an Organized Health Care Education/Training Program

## 2022-01-31 DIAGNOSIS — M5136 Other intervertebral disc degeneration, lumbar region: Secondary | ICD-10-CM

## 2022-01-31 DIAGNOSIS — Z79891 Long term (current) use of opiate analgesic: Secondary | ICD-10-CM

## 2022-01-31 DIAGNOSIS — G894 Chronic pain syndrome: Secondary | ICD-10-CM

## 2022-01-31 DIAGNOSIS — S22009S Unspecified fracture of unspecified thoracic vertebra, sequela: Secondary | ICD-10-CM

## 2022-01-31 MED ORDER — HYDROCODONE-ACETAMINOPHEN 5-325 MG PO TABS: 1.0000 | ORAL_TABLET | Freq: Every day | ORAL | 0 refills | Status: DC | PRN

## 2022-01-31 NOTE — Telephone Encounter (Signed)
PT stated that she has been in hospital and is now back home. PT is under 72 hour care at home. PT stated that she is out of meds. Don't know when she will be able to come for appt. Please give patient a call. Thanks

## 2022-01-31 NOTE — Telephone Encounter (Signed)
Patient has a script that can be picked up today for Hydrocodone.  Patient notified.

## 2022-02-20 ENCOUNTER — Ambulatory Visit
Payer: Medicare Other | Attending: Student in an Organized Health Care Education/Training Program | Admitting: Student in an Organized Health Care Education/Training Program

## 2022-02-20 ENCOUNTER — Encounter: Payer: Self-pay | Admitting: Student in an Organized Health Care Education/Training Program

## 2022-02-20 DIAGNOSIS — S22009S Unspecified fracture of unspecified thoracic vertebra, sequela: Secondary | ICD-10-CM | POA: Insufficient documentation

## 2022-02-20 DIAGNOSIS — M5136 Other intervertebral disc degeneration, lumbar region: Secondary | ICD-10-CM | POA: Diagnosis not present

## 2022-02-20 DIAGNOSIS — G894 Chronic pain syndrome: Secondary | ICD-10-CM | POA: Diagnosis not present

## 2022-02-20 DIAGNOSIS — Z79891 Long term (current) use of opiate analgesic: Secondary | ICD-10-CM | POA: Diagnosis not present

## 2022-02-20 MED ORDER — HYDROCODONE-ACETAMINOPHEN 5-325 MG PO TABS: 1.0000 | ORAL_TABLET | Freq: Every day | ORAL | 0 refills | Status: DC | PRN

## 2022-02-20 NOTE — Progress Notes (Signed)
PROVIDER NOTE: Information contained herein reflects review and annotations entered in association with encounter. Interpretation of such information and data should be left to medically-trained personnel. Information provided to patient can be located elsewhere in the medical record under "Patient Instructions". Document created using STT-dictation technology, any transcriptional errors that may result from process are unintentional.    Patient: Susan Blackwell  Service Category: E/M  Provider: Gillis Santa, MD  DOB: October 08, 1919  DOS: 02/20/2022  Specialty: Interventional Pain Management  MRN: [id number]  Setting: Ambulatory outpatient  PCP: Susan Ruths, MD  Type: Established Patient    Referring Provider: Kirk Ruths, MD  Location: Office  Delivery: Face-to-face     HPI  Ms. Susan Blackwell, a 88 y.o. year old female, is here today because of her No primary diagnosis found.. Susan Blackwell's primary complain today is Shoulder Pain (bilateral), Hip Pain (bilateral), and Leg Pain (bilateral)   Pertinent problems: Susan Blackwell has Chronic pain syndrome; Closed fracture of transverse process of thoracic vertebra ([place]); DDD (degenerative disc disease), cervical; Lumbar degenerative disc disease; Primary osteoarthritis of both shoulders; and Encounter for long-term opiate analgesic use on their pertinent problem list. Pain Assessment: Severity of Chronic pain is reported as a 0-No pain/10. Location: Shoulder Right, Left/denies. Onset: More than a month ago. Quality: Sore. Timing: Constant. Modifying factor(s): rest. Vitals:  height is _0  (1.448 m) and weight is 85 lb (38.6 kg). Her temperature is 96.9 F (36.1 C) (abnormal). Her blood pressure is 174/56 (abnormal) and her pulse is 69. Her respiration is 16 and oxygen saturation is 100%.   Reason for encounter: medication management.    No change in medical history since last visit.  Patient's pain is at baseline.  Endorsing bilateral hip  pain that is worse in the evening.  She also has bilateral shoulder pain that is worse with shoulder abduction due to shoulder osteoarthritis.  Patient continues multimodal pain regimen as prescribed.  States that it provides pain relief and improvement in functional status.  No falls or visits to urgent care since our last visit.  No constipation or nausea with medication intake.   Pharmacotherapy Assessment  Analgesic: Hydrocodone 5 mg daily as needed, quantity 30/month; MME 5    Monitoring: [place] PMP: PDMP reviewed during this encounter.       Pharmacotherapy: No side-effects or adverse reactions reported. Compliance: No problems identified. Effectiveness: Clinically acceptable.  UDS:  Summary  Date Value Ref Range Status  01/26/2021 Note  Final    Comment:    ==================================================================== ToxASSURE Select 13 (MW) ==================================================================== Test                             Result       Flag       Units  Drug Present and Declared for Prescription Verification   Hydrocodone                    368          EXPECTED   ng/mg creat   Norhydrocodone                 823          EXPECTED   ng/mg creat    Sources of hydrocodone include scheduled prescription medications.    Norhydrocodone is an expected metabolite of hydrocodone.  ==================================================================== Test  Result    Flag   Units      Ref Range   Creatinine              31               mg/dL      >=20 ==================================================================== Declared Medications:  The flagging and interpretation on this report are based on the  following declared medications.  Unexpected results may arise from  inaccuracies in the declared medications.   **Note: The testing scope of this panel includes these medications:   Hydrocodone (Norco)   **Note: The testing scope of this  panel does not include the  following reported medications:   Acetaminophen  Acetaminophen (Norco)  Calcium  Cholecalciferol  Cyanocobalamin  Fluticasone (Flonase)  Hydrochlorothiazide (Hydrodiuril)  Losartan (Cozaar)  Meclizine (Antivert)  Pantoprazole (Protonix)  Potassium (Klor-Con)  Sodium Chloride  Topical  Triamcinolone (Kenalog)  Vitamin D ==================================================================== For clinical consultation, please call [phone]. ====================================================================       ROS  Constitutional: Denies any fever or chills Gastrointestinal: No reported hemesis, hematochezia, vomiting, or acute GI distress Musculoskeletal:  Bilateral hip and shoulder pain Neurological: No reported episodes of acute onset apraxia, aphasia, dysarthria, agnosia, amnesia, paralysis, loss of coordination, or loss of consciousness  Medication Review  Acetaminophen, Arnicare, Caltrate 600+D Plus Minerals, Cholecalciferol, HYDROcodone-acetaminophen, Tetrahydrozoline HCl, cyanocobalamin, hydrochlorothiazide, losartan, melatonin, metoprolol tartrate, pantoprazole, and potassium chloride  History Review  Allergy: Susan Blackwell is allergic to tramadol and nsaids. Drug: Susan Blackwell  reports no history of drug use. Alcohol:  reports no history of alcohol use. Tobacco:  reports that she has never smoked. She has never used smokeless tobacco. Social: Susan Blackwell  reports that she has never smoked. She has never used smokeless tobacco. She reports that she does not drink alcohol and does not use drugs. Medical:  has a past medical history of Bile duct stone (02/2016), Hypertension, and PONV (postoperative nausea and vomiting). Surgical: Susan Blackwell  has a past surgical history that includes Cholecystectomy; ERCP (N/A, 03/11/2016); Breast biopsy; ERCP (N/A, 06/18/2016); Gastrointestinal stent removal (N/A, 06/18/2016); Spyglass cholangioscopy (N/A,  06/18/2016); and spyglass lithotripsy (N/A, 06/18/2016). Family: family history includes Colon cancer in her sister; Hypertension in her father and maternal grandmother.  Laboratory Chemistry Profile   Renal Lab Results  Component Value Date   BUN 14 01/03/2022   CREATININE 1.00 01/03/2022   GFRAA 57 (L) 09/02/2019   GFRNONAA 51 (L) 01/03/2022     Hepatic Lab Results  Component Value Date   AST 24 01/01/2022   ALT 15 01/01/2022   ALBUMIN 3.9 01/01/2022   ALKPHOS 59 01/01/2022   LIPASE 32 11/07/2020     Electrolytes Lab Results  Component Value Date   NA 138 01/03/2022   K 4.0 01/03/2022   CL 106 01/03/2022   CALCIUM 8.1 (L) 01/03/2022   MG 2.7 (H) 01/03/2022   PHOS 3.5 01/03/2022     Bone No results found for: "VD25OH", "VD125OH2TOT", "BW4665LD3", "TT0177LT9", "25OHVITD1", "25OHVITD2", "25OHVITD3", "TESTOFREE", "TESTOSTERONE"   Inflammation (CRP: Acute Phase) (ESR: Chronic Phase) Lab Results  Component Value Date   LATICACIDVEN 1.4 09/02/2019       Note: Above Lab results reviewed.  Recent Imaging Review  ECHOCARDIOGRAM COMPLETE    ECHOCARDIOGRAM REPORT       Patient Name:   MILLIANNA SZYMBORSKI Date of Exam: 01/02/2022 Medical Rec #:  [id number]      Height:       56.0 in Accession #:  [id number]     Weight:       85.3 lb Date of Birth:  06/01/1919      BSA:          1.236 m Patient Age:    33 years       BP:           170/52 mmHg Patient Gender: F              HR:           78 bpm. Exam Location:  [facility]  Procedure: 2D Echo, Cardiac Doppler and Color Doppler  Indications:     R01.1 Murmur   History:         Patient has no prior history of Echocardiogram examinations.                  Risk Factors:Hypertension.   Sonographer:     Rosalia Hammers Referring Phys:  [id number] [facility] A GRIFF[facility] Diagnosing Phys: Neoma Laming    Sonographer Comments: Image acquisition challenging due to uncooperative patient, Image acquisition challenging due to patient body habitus  and Image acquisition challenging due to respiratory motion. IMPRESSIONS   1. Left ventricular ejection fraction, by estimation, is 60 to 65%. The left ventricle has normal function. The left ventricle has no regional wall motion abnormalities. Left ventricular diastolic parameters are consistent with Grade I diastolic  dysfunction (impaired relaxation).  2. Right ventricular systolic function is normal. The right ventricular size is normal.  3. Left atrial size was mildly dilated.  4. Right atrial size was mildly dilated.  5. The mitral valve is degenerative. Mild to moderate mitral valve regurgitation. No evidence of mitral stenosis. Severe mitral annular calcification.  6. Tricuspid valve regurgitation is mild to moderate.  7. The aortic valve is calcified. Aortic valve regurgitation is trivial. Aortic valve sclerosis/calcification is present, without any evidence of aortic stenosis.  8. The inferior vena cava is normal in size with greater than 50% respiratory variability, suggesting right atrial pressure of 3 mmHg.  FINDINGS  Left Ventricle: Left ventricular ejection fraction, by estimation, is 60 to 65%. The left ventricle has normal function. The left ventricle has no regional wall motion abnormalities. The left ventricular internal cavity size was normal in size. There is  no left ventricular hypertrophy. Left ventricular diastolic parameters are consistent with Grade I diastolic dysfunction (impaired relaxation).  Right Ventricle: The right ventricular size is normal. No increase in right ventricular wall thickness. Right ventricular systolic function is normal.  Left Atrium: Left atrial size was mildly dilated.  Right Atrium: Right atrial size was mildly dilated.  Pericardium: There is no evidence of pericardial effusion.  Mitral Valve: The mitral valve is degenerative in appearance. Severe mitral annular calcification. Mild to moderate mitral valve regurgitation. No evidence of  mitral valve stenosis.  Tricuspid Valve: The tricuspid valve is normal in structure. Tricuspid valve regurgitation is mild to moderate. No evidence of tricuspid stenosis.  Aortic Valve: The aortic valve is calcified. Aortic valve regurgitation is trivial. Aortic regurgitation PHT measures 371 msec. Aortic valve sclerosis/calcification is present, without any evidence of aortic stenosis. Aortic valve mean gradient measures  10.0 mmHg. Aortic valve peak gradient measures 17.8 mmHg. Aortic valve area, by VTI measures 2.97 cm.  Pulmonic Valve: The pulmonic valve was normal in structure. Pulmonic valve regurgitation is trivial. No evidence of pulmonic stenosis.  Aorta: The aortic root is normal in size and structure.  Venous: The inferior vena cava is normal in  size with greater than 50% respiratory variability, suggesting right atrial pressure of 3 mmHg.  IAS/Shunts: No atrial level shunt detected by color flow Doppler.    LEFT VENTRICLE PLAX 2D LVIDd:         3.00 cm   Diastology LVIDs:         1.70 cm   LV e' medial:    6.34 cm/s LV PW:         1.00 cm   LV E/e' medial:  15.4 LV IVS:        1.00 cm   LV e' lateral:   6.96 cm/s LVOT diam:     1.90 cm   LV E/e' lateral: 14.1 LV SV:         115 LV SV Index:   93 LVOT Area:     2.84 cm    RIGHT VENTRICLE RV Basal diam:  3.00 cm RV Mid diam:    2.40 cm RV S prime:     15.20 cm/s TAPSE (M-mode): 2.0 cm  LEFT ATRIUM             Index        RIGHT ATRIUM           Index LA diam:        2.70 cm 2.18 cm/m   RA Area:     10.30 cm LA Vol (A2C):   43.6 ml 35.28 ml/m  RA Volume:   22.20 ml  17.96 ml/m LA Vol (A4C):   41.8 ml 33.82 ml/m LA Biplane Vol: 43.1 ml 34.87 ml/m  AORTIC VALVE AV Area (Vmax):    2.49 cm AV Area (Vmean):   2.64 cm AV Area (VTI):     2.97 cm AV Vmax:           211.00 cm/s AV Vmean:          145.000 cm/s AV VTI:            0.387 m AV Peak Grad:      17.8 mmHg AV Mean Grad:      10.0 mmHg LVOT Vmax:          185.00 cm/s LVOT Vmean:        135.000 cm/s LVOT VTI:          0.405 m LVOT/AV VTI ratio: 1.05 AI PHT:            371 msec   AORTA Ao Root diam: 2.70 cm Ao Asc diam:  3.10 cm  MITRAL VALVE                TRICUSPID VALVE MV Area (PHT): 2.91 cm     TR Peak grad:   47.1 mmHg MV Decel Time: 261 msec     TR Vmax:        343.00 cm/s MV E velocity: 97.90 cm/s MV A velocity: 152.00 cm/s  SHUNTS MV E/A ratio:  0.64         Systemic VTI:  0.40 m                             Systemic Diam: 1.90 cm  Neoma Laming Electronically signed by Neoma Laming Signature Date/Time: 01/02/2022/10:26:24 PM      Final   DG Ribs Unilateral Right CLINICAL DATA:  Fall.  Right chest pain  EXAM: RIGHT RIBS - 2 VIEW  COMPARISON:  Chest 01/01/2022  FINDINGS: Chronic fracture right fourth rib posteriorly.  No acute fracture or mass. No effusion or pneumothorax.  IMPRESSION: Chronic fracture right fourth rib.  No acute fracture.  Electronically Signed   By: Franchot Gallo M.D.   On: 01/02/2022 15:53 MR BRAIN WO CONTRAST CLINICAL DATA:  Dizziness, nonspecific.  Falling.  EXAM: MRI HEAD WITHOUT CONTRAST  TECHNIQUE: Multiplanar, multiecho pulse sequences of the brain and surrounding structures were obtained without intravenous contrast.  COMPARISON:  Head CT yesterday.  MRI 10/06/2016.  FINDINGS: Brain: Diffusion imaging does not show any acute or subacute infarction or other cause of restricted diffusion. Minimal chronic small-vessel ischemic change affects the pons. Few old small vessel cerebellar infarctions. Cerebral hemispheres show age related volume loss with mild for age chronic small-vessel ischemic change of the white matter. No cortical or large vessel territory infarction. No mass lesion, hemorrhage, hydrocephalus or extra-axial collection.  Vascular: Diminished vascular in flow signal in the right internal carotid artery could suggest the presence right carotid  bifurcation disease. Vessel does show flow.  Skull and upper cervical spine: Normal  Sinuses/Orbits: Clear presently. Previous functional endoscopic sinus surgery. Orbits negative.  Other: None  IMPRESSION: No acute or subacute infarction. Age related volume loss. Mild chronic small-vessel ischemic changes of the cerebral hemispheric white matter and pons. Few old small vessel cerebellar infarctions.  Flow sensitive T1 weighted imaging suggests that there could be slow in flow in the right carotid system, raising the possibility of right carotid bifurcation disease.  Electronically Signed   By: Nelson Chimes M.D.   On: 01/02/2022 14:22  Note: Reviewed        Physical Exam  General appearance: Well nourished, well developed, and well hydrated. In no apparent acute distress Mental status: Alert, oriented x 3 (person, place, & time)       Respiratory: No evidence of acute respiratory distress Eyes: PERLA Vitals: BP (!) 174/56   Pulse 69   Temp (!) 96.9 F (36.1 C)   Resp 16   Ht _0  (1.448 m)   Wt 85 lb (38.6 kg)   SpO2 100%   BMI 18.39 kg/m  BMI: Estimated body mass index is 18.39 kg/m as calculated from the following:   Height as of this encounter: _1  (1.448 m).   Weight as of this encounter: 85 lb (38.6 kg). Ideal: Female patients must weigh at least 45.5 kg to calculate ideal body weight    Lumbar Spine Area Exam  Skin & Axial Inspection: Thoraco-lumbar Scoliosis Alignment: Scoliosis detected Functional ROM: Decreased ROM       Stability: No instability detected Muscle Tone/Strength: Functionally intact. No obvious neuro-muscular anomalies detected. Sensory (Neurological): Musculoskeletal pain pattern Palpation: Complains of area being tender to palpation         Gait & Posture Assessment  Ambulation: Patient came in today in a wheel chair Gait: Significantly limited. Dependent on assistive device to ambulate Posture: Difficulty standing up straight,  due to pain    Lower Extremity Exam      Side: Right lower extremity   Side: Left lower extremity  Stability: No instability observed           Stability: No instability observed          Skin & Extremity Inspection: Skin color, temperature, and hair growth are WNL. No peripheral edema or cyanosis. No masses, redness, swelling, asymmetry, or associated skin lesions. No contractures.   Skin & Extremity Inspection: Skin color, temperature, and hair growth are WNL. No peripheral edema or cyanosis. No masses,  redness, swelling, asymmetry, or associated skin lesions. No contractures.  Functional ROM: Pain restricted ROM for hip and knee joints           Functional ROM: Pain restricted ROM for hip and knee joints          Muscle Tone/Strength: Functionally intact. No obvious neuro-muscular anomalies detected.   Muscle Tone/Strength: Functionally intact. No obvious neuro-muscular anomalies detected.  Sensory (Neurological): Arthropathic arthralgia         Sensory (Neurological): Arthropathic arthralgia           Assessment   Status Diagnosis  Controlled Controlled Controlled 1. Encounter for long-term opiate analgesic use   2. Lumbar degenerative disc disease   3. Closed fracture of transverse process of thoracic vertebra, sequela   4. Chronic pain syndrome          Plan of Care   Ms. Jennica Tagliaferri has a current medication list which includes the following long-term medication(s): caltrate 600+d plus minerals, losartan, metoprolol tartrate, pantoprazole, potassium chloride, [START ON 03/02/2022] hydrocodone-acetaminophen, [START ON 04/01/2022] hydrocodone-acetaminophen, and [START ON 05/01/2022] hydrocodone-acetaminophen.  Pharmacotherapy (Medications Ordered): Meds ordered this encounter  Medications   HYDROcodone-acetaminophen (NORCO/VICODIN) 5-325 MG tablet    Sig: Take 1 tablet by mouth daily as needed for moderate pain. For chronic pain syndrome    Dispense:  30 tablet    Refill:   0   HYDROcodone-acetaminophen (NORCO/VICODIN) 5-325 MG tablet    Sig: Take 1 tablet by mouth daily as needed for moderate pain. For chronic pain syndrome    Dispense:  30 tablet    Refill:  0   HYDROcodone-acetaminophen (NORCO/VICODIN) 5-325 MG tablet    Sig: Take 1 tablet by mouth daily as needed for moderate pain. For chronic pain syndrome    Dispense:  30 tablet    Refill:  0   Orders Placed This Encounter  Procedures   ToxASSURE Select 13 (MW), Urine    Volume: 30 ml(s). Minimum 3 ml of urine is needed. Document temperature of fresh sample. Indications: Long term (current) use of opiate analgesic [phone])    Order Specific Question:   Release to patient    Answer:   Immediate     Follow-up plan:   Return in about 3 months (around 05/23/2022) for Medication Management, in person.   Recent Visits No visits were found meeting these conditions. Showing recent visits within past 90 days and meeting all other requirements Today's Visits Date Type Provider Dept  02/20/22 Office Visit Susan Santa, MD [facility]  Showing today's visits and meeting all other requirements Future Appointments Date Type Provider Dept  05/15/22 Appointment Susan Santa, MD [facility]  Showing future appointments within next 90 days and meeting all other requirements  I discussed the assessment and treatment plan with the patient. The patient was provided an opportunity to ask questions and all were answered. The patient agreed with the plan and demonstrated an understanding of the instructions.  Patient advised to call back or seek an in-person evaluation if the symptoms or condition worsens.  Duration of encounter:71mnutes.  Note by: BGillis Santa MD Date: 02/20/2022; Time: 2:16 PM

## 2022-02-20 NOTE — Progress Notes (Signed)
Nursing Pain Medication Assessment:  Safety precautions to be maintained throughout the outpatient stay will include: orient to surroundings, keep bed in low position, maintain call bell within reach at all times, provide assistance with transfer out of bed and ambulation.  Medication Inspection Compliance: Pill count conducted under aseptic conditions, in front of the patient. Neither the pills nor the bottle was removed from the patient's sight at any time. Once count was completed pills were immediately returned to the patient in their original bottle.  Medication: Hydrocodone/APAP Pill/Patch Count:  12 of 30 pills remain Pill/Patch Appearance: Markings consistent with prescribed medication Bottle Appearance: Standard pharmacy container. Clearly labeled. Filled Date: 88 / 11 / 2023 Last Medication intake:  Yesterday

## 2022-02-23 LAB — TOXASSURE SELECT 13 (MW), URINE

## 2022-05-15 ENCOUNTER — Encounter: Payer: Self-pay | Admitting: Student in an Organized Health Care Education/Training Program

## 2022-05-15 ENCOUNTER — Ambulatory Visit
Payer: Medicare Other | Attending: Student in an Organized Health Care Education/Training Program | Admitting: Student in an Organized Health Care Education/Training Program

## 2022-05-15 VITALS — BP 140/57 | HR 73 | Temp 97.5°F | Resp 17 | Ht <= 58 in | Wt 85.0 lb

## 2022-05-15 DIAGNOSIS — M5136 Other intervertebral disc degeneration, lumbar region: Secondary | ICD-10-CM | POA: Diagnosis not present

## 2022-05-15 DIAGNOSIS — G894 Chronic pain syndrome: Secondary | ICD-10-CM | POA: Diagnosis present

## 2022-05-15 DIAGNOSIS — M1712 Unilateral primary osteoarthritis, left knee: Secondary | ICD-10-CM | POA: Insufficient documentation

## 2022-05-15 DIAGNOSIS — M19012 Primary osteoarthritis, left shoulder: Secondary | ICD-10-CM | POA: Insufficient documentation

## 2022-05-15 DIAGNOSIS — M19011 Primary osteoarthritis, right shoulder: Secondary | ICD-10-CM | POA: Insufficient documentation

## 2022-05-15 DIAGNOSIS — M5030 Other cervical disc degeneration, unspecified cervical region: Secondary | ICD-10-CM | POA: Insufficient documentation

## 2022-05-15 DIAGNOSIS — Z79891 Long term (current) use of opiate analgesic: Secondary | ICD-10-CM | POA: Diagnosis present

## 2022-05-15 DIAGNOSIS — S22009S Unspecified fracture of unspecified thoracic vertebra, sequela: Secondary | ICD-10-CM | POA: Diagnosis present

## 2022-05-15 MED ORDER — HYDROCODONE-ACETAMINOPHEN 5-325 MG PO TABS: 1.0000 | ORAL_TABLET | Freq: Every day | ORAL | 0 refills | Status: AC | PRN

## 2022-05-15 NOTE — Patient Instructions (Addendum)
Fill dates: 2/8 3/9 4/8

## 2022-05-15 NOTE — Progress Notes (Signed)
Nursing Pain Medication Assessment:  Safety precautions to be maintained throughout the outpatient stay will include: orient to surroundings, keep bed in low position, maintain call bell within reach at all times, provide assistance with transfer out of bed and ambulation.  Medication Inspection Compliance: Pill count conducted under aseptic conditions, in front of the patient. Neither the pills nor the bottle was removed from the patient's sight at any time. Once count was completed pills were immediately returned to the patient in their original bottle.  Medication: Hydrocodone/APAP Pill/Patch Count:  18 of 30 pills remain Pill/Patch Appearance: Markings consistent with prescribed medication Bottle Appearance: Standard pharmacy container. Clearly labeled. Filled Date: 01 / 09 / 2024 Last Medication intake:  Yesterday

## 2022-05-15 NOTE — Progress Notes (Signed)
PROVIDER NOTE: Information contained herein reflects review and annotations entered in association with encounter. Interpretation of such information and data should be left to medically-trained personnel. Information provided to patient can be located elsewhere in the medical record under "Patient Instructions". Document created using STT-dictation technology, any transcriptional errors that may result from process are unintentional.    Patient: Susan Blackwell  Service Category: E/M  Provider: Edward Jolly, MD  DOB: [birth year]  DOS: 05/15/2022  Specialty: Interventional Pain Management  MRN: [id number]  Setting: Ambulatory outpatient  PCP: Lauro Regulus, MD  Type: Established Patient    Referring Provider: Lauro Regulus, MD  Location: Office  Delivery: Face-to-face     HPI  Susan Blackwell, a 89 y.o. year old female, is here today because of her Encounter for long-term opiate analgesic use [Z79.891]. Ms. Yin primary complain today is Joint Pain   Pertinent problems: Ms. Burdin has Chronic pain syndrome; Closed fracture of transverse process of thoracic vertebra (HCC); DDD (degenerative disc disease), cervical; Lumbar degenerative disc disease; Primary osteoarthritis of both shoulders; and Encounter for long-term opiate analgesic use on their pertinent problem list. Pain Assessment: Severity of Chronic pain is reported as a 6 /10. Location: Shoulder Right, Left (RIGHT is worse)/ . Onset: More than a month ago. Quality:  . Timing: Constant ("mainly in the afternoon and evening"). Modifying factor(s): meds, topicals. Vitals:  height is 4\' 9"  (1.448 m) and weight is 85 lb (38.6 kg). Her temporal temperature is 97.5 F (36.4 C) (abnormal). Her blood pressure is 140/57 (abnormal) and her pulse is 73. Her respiration is 17 and oxygen saturation is 98%.   Reason for encounter: medication management.    No change in medical history since last visit.  Patient's pain is at baseline.   Patient continues multimodal pain regimen (APAP Arthritis BID) as prescribed.  States that it provides pain relief and improvement in functional status.    Pharmacotherapy Assessment  Analgesic: Hydrocodone 5 mg daily as needed, quantity 30/month; MME 5    Monitoring: [place] PMP: PDMP reviewed during this encounter.       Pharmacotherapy: No side-effects or adverse reactions reported. Compliance: No problems identified. Effectiveness: Clinically acceptable.  UDS:  Summary  Date Value Ref Range Status  02/20/2022 Note  Final    Comment:    ==================================================================== [employer] Select 13 (MW) ==================================================================== Test                             Result       Flag       Units  Drug Present and Declared for Prescription Verification   Hydrocodone                    373          EXPECTED   ng/mg creat   Hydromorphone                  227          EXPECTED   ng/mg creat   Norhydrocodone                 886          EXPECTED   ng/mg creat    Sources of hydrocodone include scheduled prescription medications.    Hydromorphone and norhydrocodone are expected metabolites of    hydrocodone. Hydromorphone is also available as a scheduled    prescription medication.  ====================================================================  Test                      Result    Flag   Units      Ref Range   Creatinine              37               mg/dL      >=20 ==================================================================== Declared Medications:  The flagging and interpretation on this report are based on the  following declared medications.  Unexpected results may arise from  inaccuracies in the declared medications.   **Note: The testing scope of this panel includes these medications:   Hydrocodone (Norco)   **Note: The testing scope of this panel does not include the  following reported  medications:   Acetaminophen  Acetaminophen (Norco)  Calcium  Cholecalciferol  Eye Drop  Hydrochlorothiazide (Hydrodiuril)  Losartan (Cozaar)  Melatonin  Metoprolol (Lopressor)  Pantoprazole (Protonix)  Potassium (Klor-Con)  Topical  Vitamin B12  Vitamin D ==================================================================== For clinical consultation, please call [phone]. ====================================================================       ROS  Constitutional: Denies any fever or chills Gastrointestinal: No reported hemesis, hematochezia, vomiting, or acute GI distress Musculoskeletal:  Bilateral hip and shoulder pain, low back and knee pain Neurological: No reported episodes of acute onset apraxia, aphasia, dysarthria, agnosia, amnesia, paralysis, loss of coordination, or loss of consciousness  Medication Review  Acetaminophen, Arnicare, Caltrate 600+D Plus Minerals, Cholecalciferol, HYDROcodone-acetaminophen, Tetrahydrozoline HCl, cyanocobalamin, hydrochlorothiazide, losartan, melatonin, metoprolol tartrate, pantoprazole, and potassium chloride  History Review  Allergy: Ms. Melchior is allergic to tramadol and nsaids. Drug: Ms. Bias  reports no history of drug use. Alcohol:  reports no history of alcohol use. Tobacco:  reports that she has never smoked. She has never used smokeless tobacco. Social: Ms. Averhart  reports that she has never smoked. She has never used smokeless tobacco. She reports that she does not drink alcohol and does not use drugs. Medical:  has a past medical history of Bile duct stone (02/2016), Hypertension, and PONV (postoperative nausea and vomiting). Surgical: Ms. Jared  has a past surgical history that includes Cholecystectomy; ERCP (N/A, 03/11/2016); Breast biopsy; ERCP (N/A, 06/18/2016); Gastrointestinal stent removal (N/A, 06/18/2016); Spyglass cholangioscopy (N/A, 06/18/2016); and spyglass lithotripsy (N/A, 06/18/2016). Family: family  history includes Colon cancer in her sister; Hypertension in her father and maternal grandmother.  Laboratory Chemistry Profile   Renal Lab Results  Component Value Date   BUN 14 01/03/2022   CREATININE 1.00 01/03/2022   GFRAA 57 (L) 09/02/2019   GFRNONAA 51 (L) 01/03/2022     Hepatic Lab Results  Component Value Date   AST 24 01/01/2022   ALT 15 01/01/2022   ALBUMIN 3.9 01/01/2022   ALKPHOS 59 01/01/2022   LIPASE 32 11/07/2020     Electrolytes Lab Results  Component Value Date   NA 138 01/03/2022   K 4.0 01/03/2022   CL 106 01/03/2022   CALCIUM 8.1 (L) 01/03/2022   MG 2.7 (H) 01/03/2022   PHOS 3.5 01/03/2022     Bone No results found for: "VD25OH", "VD125OH2TOT", "JS9702OV7", "CH8850YD7", "25OHVITD1", "25OHVITD2", "25OHVITD3", "TESTOFREE", "TESTOSTERONE"   Inflammation (CRP: Acute Phase) (ESR: Chronic Phase) Lab Results  Component Value Date   LATICACIDVEN 1.4 09/02/2019       Note: Above Lab results reviewed.  Recent Imaging Review  ECHOCARDIOGRAM COMPLETE    ECHOCARDIOGRAM REPORT       Patient Name:   Brandolyn Takeda Date of  Exam: 01/02/2022 Medical Rec #:  [id number]      Height:       56.0 in Accession #:    [id number]     Weight:       85.3 lb Date of Birth:  08/26/1919      BSA:          1.236 m Patient Age:    89 years       BP:           170/52 mmHg Patient Gender: F              HR:           78 bpm. Exam Location:  [facility]  Procedure: 2D Echo, Cardiac Doppler and Color Doppler  Indications:     R01.1 Murmur   History:         Patient has no prior history of Echocardiogram examinations.                  Risk Factors:Hypertension.   Sonographer:     Ceasar Mons Referring Phys:  [id number] [facility] A GRIFFITH Diagnosing Phys: Adrian Blackwater    Sonographer Comments: Image acquisition challenging due to uncooperative patient, Image acquisition challenging due to patient body habitus and Image acquisition challenging due to respiratory  motion. IMPRESSIONS   1. Left ventricular ejection fraction, by estimation, is 60 to 65%. The left ventricle has normal function. The left ventricle has no regional wall motion abnormalities. Left ventricular diastolic parameters are consistent with Grade I diastolic  dysfunction (impaired relaxation).  2. Right ventricular systolic function is normal. The right ventricular size is normal.  3. Left atrial size was mildly dilated.  4. Right atrial size was mildly dilated.  5. The mitral valve is degenerative. Mild to moderate mitral valve regurgitation. No evidence of mitral stenosis. Severe mitral annular calcification.  6. Tricuspid valve regurgitation is mild to moderate.  7. The aortic valve is calcified. Aortic valve regurgitation is trivial. Aortic valve sclerosis/calcification is present, without any evidence of aortic stenosis.  8. The inferior vena cava is normal in size with greater than 50% respiratory variability, suggesting right atrial pressure of 3 mmHg.  FINDINGS  Left Ventricle: Left ventricular ejection fraction, by estimation, is 60 to 65%. The left ventricle has normal function. The left ventricle has no regional wall motion abnormalities. The left ventricular internal cavity size was normal in size. There is  no left ventricular hypertrophy. Left ventricular diastolic parameters are consistent with Grade I diastolic dysfunction (impaired relaxation).  Right Ventricle: The right ventricular size is normal. No increase in right ventricular wall thickness. Right ventricular systolic function is normal.  Left Atrium: Left atrial size was mildly dilated.  Right Atrium: Right atrial size was mildly dilated.  Pericardium: There is no evidence of pericardial effusion.  Mitral Valve: The mitral valve is degenerative in appearance. Severe mitral annular calcification. Mild to moderate mitral valve regurgitation. No evidence of mitral valve stenosis.  Tricuspid Valve: The  tricuspid valve is normal in structure. Tricuspid valve regurgitation is mild to moderate. No evidence of tricuspid stenosis.  Aortic Valve: The aortic valve is calcified. Aortic valve regurgitation is trivial. Aortic regurgitation PHT measures 371 msec. Aortic valve sclerosis/calcification is present, without any evidence of aortic stenosis. Aortic valve mean gradient measures  10.0 mmHg. Aortic valve peak gradient measures 17.8 mmHg. Aortic valve area, by VTI measures 2.97 cm.  Pulmonic Valve: The pulmonic valve was normal in structure. Pulmonic valve regurgitation is  trivial. No evidence of pulmonic stenosis.  Aorta: The aortic root is normal in size and structure.  Venous: The inferior vena cava is normal in size with greater than 50% respiratory variability, suggesting right atrial pressure of 3 mmHg.  IAS/Shunts: No atrial level shunt detected by color flow Doppler.    LEFT VENTRICLE PLAX 2D LVIDd:         3.00 cm   Diastology LVIDs:         1.70 cm   LV e' medial:    6.34 cm/s LV PW:         1.00 cm   LV E/e' medial:  15.4 LV IVS:        1.00 cm   LV e' lateral:   6.96 cm/s LVOT diam:     1.90 cm   LV E/e' lateral: 14.1 LV SV:         115 LV SV Index:   93 LVOT Area:     2.84 cm    RIGHT VENTRICLE RV Basal diam:  3.00 cm RV Mid diam:    2.40 cm RV S prime:     15.20 cm/s TAPSE (M-mode): 2.0 cm  LEFT ATRIUM             Index        RIGHT ATRIUM           Index LA diam:        2.70 cm 2.18 cm/m   RA Area:     10.30 cm LA Vol (A2C):   43.6 ml 35.28 ml/m  RA Volume:   22.20 ml  17.96 ml/m LA Vol (A4C):   41.8 ml 33.82 ml/m LA Biplane Vol: 43.1 ml 34.87 ml/m  AORTIC VALVE AV Area (Vmax):    2.49 cm AV Area (Vmean):   2.64 cm AV Area (VTI):     2.97 cm AV Vmax:           211.00 cm/s AV Vmean:          145.000 cm/s AV VTI:            0.387 m AV Peak Grad:      17.8 mmHg AV Mean Grad:      10.0 mmHg LVOT Vmax:         185.00 cm/s LVOT Vmean:        135.000  cm/s LVOT VTI:          0.405 m LVOT/AV VTI ratio: 1.05 AI PHT:            371 msec   AORTA Ao Root diam: 2.70 cm Ao Asc diam:  3.10 cm  MITRAL VALVE                TRICUSPID VALVE MV Area (PHT): 2.91 cm     TR Peak grad:   47.1 mmHg MV Decel Time: 261 msec     TR Vmax:        343.00 cm/s MV E velocity: 97.90 cm/s MV A velocity: 152.00 cm/s  SHUNTS MV E/A ratio:  0.64         Systemic VTI:  0.40 m                             Systemic Diam: 1.90 cm  Adrian Blackwater Electronically signed by Adrian Blackwater Signature Date/Time: 01/02/2022/10:26:24 PM      Final   DG Ribs Unilateral Right CLINICAL DATA:  Fall.  Right chest pain  EXAM: RIGHT RIBS - 2 VIEW  COMPARISON:  Chest 01/01/2022  FINDINGS: Chronic fracture right fourth rib posteriorly. No acute fracture or mass. No effusion or pneumothorax.  IMPRESSION: Chronic fracture right fourth rib.  No acute fracture.  Electronically Signed   By: Marlan Palau M.D.   On: 01/02/2022 15:53 MR BRAIN WO CONTRAST CLINICAL DATA:  Dizziness, nonspecific.  Falling.  EXAM: MRI HEAD WITHOUT CONTRAST  TECHNIQUE: Multiplanar, multiecho pulse sequences of the brain and surrounding structures were obtained without intravenous contrast.  COMPARISON:  Head CT yesterday.  MRI 10/06/2016.  FINDINGS: Brain: Diffusion imaging does not show any acute or subacute infarction or other cause of restricted diffusion. Minimal chronic small-vessel ischemic change affects the pons. Few old small vessel cerebellar infarctions. Cerebral hemispheres show age related volume loss with mild for age chronic small-vessel ischemic change of the white matter. No cortical or large vessel territory infarction. No mass lesion, hemorrhage, hydrocephalus or extra-axial collection.  Vascular: Diminished vascular in flow signal in the right internal carotid artery could suggest the presence right carotid bifurcation disease. Vessel does show flow.  Skull and  upper cervical spine: Normal  Sinuses/Orbits: Clear presently. Previous functional endoscopic sinus surgery. Orbits negative.  Other: None  IMPRESSION: No acute or subacute infarction. Age related volume loss. Mild chronic small-vessel ischemic changes of the cerebral hemispheric white matter and pons. Few old small vessel cerebellar infarctions.  Flow sensitive T1 weighted imaging suggests that there could be slow in flow in the right carotid system, raising the possibility of right carotid bifurcation disease.  Electronically Signed   By: Paulina Fusi M.D.   On: 01/02/2022 14:22  Note: Reviewed        Physical Exam  General appearance: Well nourished, well developed, and well hydrated. In no apparent acute distress Mental status: Alert, oriented x 3 (person, place, & time)       Respiratory: No evidence of acute respiratory distress Eyes: PERLA Vitals: BP (!) 140/57   Pulse 73   Temp (!) 97.5 F (36.4 C) (Temporal)   Resp 17   Ht 4\' 9"  (1.448 m)   Wt 85 lb (38.6 kg) Comment: stated  SpO2 98%   BMI 18.39 kg/m  BMI: Estimated body mass index is 18.39 kg/m as calculated from the following:   Height as of this encounter: 4\' 9"  (1.448 m).   Weight as of this encounter: 85 lb (38.6 kg). Ideal: Female patients must weigh at least 45.5 kg to calculate ideal body weight    Lumbar Spine Area Exam  Skin & Axial Inspection: Thoraco-lumbar Scoliosis Alignment: Scoliosis detected Functional ROM: Decreased ROM       Stability: No instability detected Muscle Tone/Strength: Functionally intact. No obvious neuro-muscular anomalies detected. Sensory (Neurological): Musculoskeletal pain pattern Palpation: Complains of area being tender to palpation         Gait & Posture Assessment  Ambulation: Patient came in today in a wheel chair Gait: Significantly limited. Dependent on assistive device to ambulate Posture: Difficulty standing up straight, due to pain    Lower Extremity  Exam      Side: Right lower extremity   Side: Left lower extremity  Stability: No instability observed           Stability: No instability observed          Skin & Extremity Inspection: Skin color, temperature, and hair growth are WNL. No peripheral edema or cyanosis. No masses, redness, swelling, asymmetry,  or associated skin lesions. No contractures.   Skin & Extremity Inspection: Skin color, temperature, and hair growth are WNL. No peripheral edema or cyanosis. No masses, redness, swelling, asymmetry, or associated skin lesions. No contractures.  Functional ROM: Pain restricted ROM for hip and knee joints           Functional ROM: Pain restricted ROM for hip and knee joints          Muscle Tone/Strength: Functionally intact. No obvious neuro-muscular anomalies detected.   Muscle Tone/Strength: Functionally intact. No obvious neuro-muscular anomalies detected.  Sensory (Neurological): Arthropathic arthralgia         Sensory (Neurological): Arthropathic arthralgia           Assessment   Status Diagnosis  Controlled Controlled Controlled 1. Encounter for long-term opiate analgesic use   2. Lumbar degenerative disc disease   3. Closed fracture of transverse process of thoracic vertebra, sequela   4. Primary osteoarthritis of both shoulders   5. Primary osteoarthritis of left knee   6. DDD (degenerative disc disease), cervical   7. Chronic pain syndrome           Plan of Care   Ms. Kiya Eno has a current medication list which includes the following long-term medication(s): caltrate 600+d plus minerals, losartan, metoprolol tartrate, pantoprazole, potassium chloride, [START ON 05/31/2022] hydrocodone-acetaminophen, [START ON 06/30/2022] hydrocodone-acetaminophen, and [START ON 07/30/2022] hydrocodone-acetaminophen.  Pharmacotherapy (Medications Ordered): Meds ordered this encounter  Medications   HYDROcodone-acetaminophen (NORCO/VICODIN) 5-325 MG tablet    Sig: Take 1 tablet by  mouth daily as needed for moderate pain. For chronic pain syndrome    Dispense:  30 tablet    Refill:  0   HYDROcodone-acetaminophen (NORCO/VICODIN) 5-325 MG tablet    Sig: Take 1 tablet by mouth daily as needed for moderate pain. For chronic pain syndrome    Dispense:  30 tablet    Refill:  0   HYDROcodone-acetaminophen (NORCO/VICODIN) 5-325 MG tablet    Sig: Take 1 tablet by mouth daily as needed for moderate pain. For chronic pain syndrome    Dispense:  30 tablet    Refill:  0   No orders of the defined types were placed in this encounter.    Follow-up plan:   Return in about 15 weeks (around 08/28/2022) for Medication Management, in person.   Recent Visits Date Type Provider Dept  02/20/22 Office Visit Gillis Santa, MD [facility]  Showing recent visits within past 90 days and meeting all other requirements Today's Visits Date Type Provider Dept  05/15/22 Office Visit Gillis Santa, MD [facility]  Showing today's visits and meeting all other requirements Future Appointments No visits were found meeting these conditions. Showing future appointments within next 90 days and meeting all other requirements  I discussed the assessment and treatment plan with the patient. The patient was provided an opportunity to ask questions and all were answered. The patient agreed with the plan and demonstrated an understanding of the instructions.  Patient advised to call back or seek an in-person evaluation if the symptoms or condition worsens.  Duration of encounter:42minutes.  Note by: Gillis Santa, MD Date: 05/15/2022; Time: 11:30 AM

## 2022-08-23 ENCOUNTER — Encounter: Payer: Medicare Other | Admitting: Student in an Organized Health Care Education/Training Program

## 2022-12-12 ENCOUNTER — Encounter: Payer: Self-pay | Admitting: Oncology

## 2022-12-12 ENCOUNTER — Inpatient Hospital Stay: Payer: Medicare Other

## 2022-12-12 ENCOUNTER — Inpatient Hospital Stay: Payer: Medicare Other | Attending: Oncology | Admitting: Oncology

## 2022-12-12 DIAGNOSIS — D539 Nutritional anemia, unspecified: Secondary | ICD-10-CM | POA: Diagnosis present

## 2022-12-12 NOTE — Assessment & Plan Note (Addendum)
Labs are reviewed and discussed with patient and her niece.  Long standing chronic history of macrocytic anemia, progressively worse.  B12 Folate were checked in the past and were adequate.  I recommend further work up with  cbc, smear, repeat B12, Folate, SPEP.  Discussed about the possibility of underlying bone marrow disorders, ie MDS, and option of bone marrow biopsy for work up. Supportive care including blood transfusion may help to improve her fatigue.  She is not interested in getting blood work done, or blood transfusion, or bone marrow biopsy.  She is not interested in palliative care service evaluation.

## 2022-12-12 NOTE — Progress Notes (Signed)
Hematology/Oncology Consult note [phone] [phone]      Patient Care Team: Lauro Regulus, MD as PCP - General (Internal Medicine)   REFERRING PROVIDER: Lauro Regulus, MD  CHIEF COMPLAINTS/REASON FOR VISIT:  Anemia  ASSESSMENT & PLAN:  Macrocytic anemia Labs are reviewed and discussed with patient and her niece.  Long standing chronic history of macrocytic anemia, progressively worse.  B12 Folate were checked in the past and were adequate.  I recommend further work up with  cbc, smear, repeat B12, Folate, SPEP.  Discussed about the possibility of underlying bone marrow disorders, ie MDS, and option of bone marrow biopsy for work up. Supportive care including blood transfusion may help to improve her fatigue.  She is not interested in getting blood work done, or blood transfusion, or bone marrow biopsy.  She is not interested in palliative care service evaluation.   She is not interested in follow up routinely to monitor her counts.  All questions were answered. The patient knows to call the clinic with any problems, questions or concerns.  Rickard Patience, MD, PhD [facility] Hematology Oncology 12/12/2022     HISTORY OF PRESENTING ILLNESS:  Susan Blackwell is a  89 y.o.  female with PMH listed below who was referred to me for anemia   Reviewed patient's previous labs ordered by primary care physician's office, anemia is chronic onset , duration is since at least 2015 11/22/2022 cbc showed hemoglobin 8.4, MCV 109.platelet 148,000, wbc 4, ALC 0.59 + fatigue  She denies recent chest pain on exertion, shortness of breath , pre-syncopal episodes, or palpitations She had not noticed any recent bleeding such as epistaxis, hematuria or hematochezia.  She is accompanied by her niece.  She is a poor historian.    MEDICAL HISTORY:  Past Medical History:  Diagnosis Date   Bile duct stone 02/2016   Hypertension    PONV (postoperative nausea and  vomiting)     SURGICAL HISTORY: Past Surgical History:  Procedure Laterality Date   BREAST BIOPSY     CHOLECYSTECTOMY     ERCP N/A 03/11/2016   Procedure: ENDOSCOPIC RETROGRADE CHOLANGIOPANCREATOGRAPHY (ERCP);  Surgeon: Vida Rigger, MD;  Location: [facility];  Service: Endoscopy;  Laterality: N/A;   ERCP N/A 06/18/2016   Procedure: ENDOSCOPIC RETROGRADE CHOLANGIOPANCREATOGRAPHY (ERCP);  Surgeon: Vida Rigger, MD;  Location: [facility];  Service: Endoscopy;  Laterality: N/A;  moved for litho us/LH   GASTROINTESTINAL STENT REMOVAL N/A 06/18/2016   Procedure: GASTROINTESTINAL STENT REMOVAL;  Surgeon: Vida Rigger, MD;  Location: [facility] ENDOSCOPY;  Service: Endoscopy;  Laterality: N/A;   SPYGLASS CHOLANGIOSCOPY N/A 06/18/2016   Procedure: [id number] CHOLANGIOSCOPY;  Surgeon: Vida Rigger, MD;  Location: [facility];  Service: Endoscopy;  Laterality: N/A;   SPYGLASS LITHOTRIPSY N/A 06/18/2016   Procedure: [id number] LITHOTRIPSY;  Surgeon: Vida Rigger, MD;  Location: [facility];  Service: Endoscopy;  Laterality: N/A;    SOCIAL HISTORY: Social History   Socioeconomic History   Marital status: Widowed    Spouse name: Not on file   Number of children: Not on file   Years of education: Not on file   Highest education level: Not on file  Occupational History   Not on file  Tobacco Use   Smoking status: Never   Smokeless tobacco: Never  Substance and Sexual Activity   Alcohol use: Never   Drug use: Never   Sexual activity: Not Currently  Other Topics Concern   Not on file  Social History Narrative   Not  on file   Social Determinants of Health   Financial Resource Strain: Low Risk  (11/22/2022)   Received from [facility]   Overall Financial Resource Strain (CARDIA)    Difficulty of Paying Living Expenses: Not hard at all  Food Insecurity: No Food Insecurity (12/12/2022)   Hunger Vital Sign    Worried About Running Out of Food in the Last Year: Never true    Ran Out of Food  in the Last Year: Never true  Transportation Needs: No Transportation Needs (12/12/2022)   PRAPARE - Administrator, Civil Service (Medical): No    Lack of Transportation (Non-Medical): No  Physical Activity: Not on file  Stress: Not on file  Social Connections: Not on file  Intimate Partner Violence: Not At Risk (12/12/2022)   Humiliation, Afraid, Rape, and Kick questionnaire    Fear of Current or Ex-Partner: No    Emotionally Abused: No    Physically Abused: No    Sexually Abused: No    FAMILY HISTORY: Family History  Problem Relation Age of Onset   Kidney disease Mother    Kidney disease Father    Hypertension Father    Colon cancer Sister    Lung cancer Sister    Hypertension Maternal Grandmother     ALLERGIES:  is allergic to tramadol and nsaids.  MEDICATIONS:  Current Outpatient Medications  Medication Sig Dispense Refill   Acetaminophen (TYLENOL ARTHRITIS PAIN PO) Take 650 mg by mouth 2 (two) times daily.     Calcium Carbonate-Vit D-Min (CALTRATE 600+D PLUS MINERALS) 600-800 MG-UNIT TABS Take 1 tablet by mouth daily.     Cholecalciferol 2000 units TABS Take 1,000 Units by mouth daily.     Homeopathic Products (ARNICARE) GEL Apply 1 application topically daily as needed (pain).     hydrochlorothiazide (HYDRODIURIL) 25 MG tablet Take 25 mg by mouth daily.     HYDROcodone-acetaminophen (NORCO/VICODIN) 5-325 MG tablet Take 1 tablet by mouth daily as needed for moderate pain. For chronic pain syndrome 30 tablet 0   losartan (COZAAR) 100 MG tablet Take 100 mg by mouth daily.     melatonin 3 MG TABS tablet Take 3 mg by mouth at bedtime.     metoprolol (LOPRESSOR) 50 MG tablet Take 50 mg by mouth 2 (two) times daily.     pantoprazole (PROTONIX) 40 MG tablet Take 40 mg by mouth daily.     potassium chloride (K-DUR) 10 MEQ tablet Take 10 mEq by mouth daily.      Tetrahydrozoline HCl (VISINE OP) Apply 1 drop to eye daily as needed (dry eyes).     vitamin B-12  (CYANOCOBALAMIN) 1000 MCG tablet Take 1,000 mcg by mouth daily.     HYDROcodone-acetaminophen (NORCO/VICODIN) 5-325 MG tablet Take 1 tablet by mouth daily as needed for moderate pain. For chronic pain syndrome 30 tablet 0   HYDROcodone-acetaminophen (NORCO/VICODIN) 5-325 MG tablet Take 1 tablet by mouth daily as needed for moderate pain. For chronic pain syndrome 30 tablet 0   No current facility-administered medications for this visit.    Review of Systems  Constitutional:  Positive for appetite change and fatigue. Negative for chills and unexpected weight change.  HENT:   Negative for hearing loss and voice change.   Eyes:  Negative for eye problems.  Respiratory:  Negative for chest tightness and cough.   Cardiovascular:  Negative for chest pain.  Gastrointestinal:  Negative for abdominal distention, abdominal pain and blood in stool.  Endocrine: Negative  for hot flashes.  Genitourinary:  Negative for difficulty urinating and frequency.   Musculoskeletal:  Negative for arthralgias.  Skin:  Negative for itching and rash.  Neurological:  Negative for extremity weakness.  Hematological:  Negative for adenopathy.  Psychiatric/Behavioral:  Negative for confusion.     PHYSICAL EXAMINATION: There were no vitals filed for this visit. There were no vitals filed for this visit.  Physical Exam Constitutional:      General: She is not in acute distress.    Comments: Patient sits in the wheelchair, frail appearance.   HENT:     Head: Normocephalic and atraumatic.  Eyes:     General: No scleral icterus. Cardiovascular:     Rate and Rhythm: Normal rate.     Heart sounds: Murmur heard.  Pulmonary:     Effort: Pulmonary effort is normal. No respiratory distress.     Breath sounds: No wheezing.  Abdominal:     General: Bowel sounds are normal. There is no distension.     Palpations: Abdomen is soft.  Musculoskeletal:        General: Normal range of motion.     Cervical back: Normal  range of motion and neck supple.  Skin:    General: Skin is dry.     Coloration: Skin is pale.     Findings: No erythema.  Neurological:     Mental Status: She is alert. Mental status is at baseline.  Psychiatric:        Mood and Affect: Mood normal.      LABORATORY DATA:  I have reviewed the data as listed    Latest Ref Rng & Units 01/03/2022    4:32 AM 01/02/2022    5:06 AM 01/01/2022    1:26 PM  CBC  WBC 4.0 - 10.5 K/uL 4.0  4.9  4.4   Hemoglobin 12.0 - 15.0 g/dL 8.1  7.6  8.5   Hematocrit 36.0 - 46.0 % 24.4  22.4  25.9   Platelets 150 - 400 K/uL 116  107  125       Latest Ref Rng & Units 01/03/2022    4:32 AM 01/02/2022    5:06 AM 01/01/2022    1:26 PM  CMP  Glucose 70 - 99 mg/dL 95  93  425   BUN 8 - 23 mg/dL 14  18  20    Creatinine 0.44 - 1.00 mg/dL 9.56  3.87  5.64   Sodium 135 - 145 mmol/L 138  140  141   Potassium 3.5 - 5.1 mmol/L 4.0  3.7  3.9   Chloride 98 - 111 mmol/L 106  108  107   CO2 22 - 32 mmol/L 29  25  27    Calcium 8.9 - 10.3 mg/dL 8.1  8.5  9.3   Total Protein 6.5 - 8.1 g/dL   6.1   Total Bilirubin 0.3 - 1.2 mg/dL   1.0   Alkaline Phos 38 - 126 U/L   59   AST 15 - 41 U/L   24   ALT 0 - 44 U/L   15        RADIOGRAPHIC STUDIES: I have personally reviewed the radiological images as listed and agreed with the findings in the report. No results found.
